# Patient Record
Sex: Female | Born: 1985 | Hispanic: No | Marital: Single | Smoking: Never smoker
Health system: Southern US, Community
[De-identification: ages and names within clinical notes are randomized; demographics above are authoritative.]

## PROBLEM LIST (undated history)

## (undated) DIAGNOSIS — F79 Unspecified intellectual disabilities: Secondary | ICD-10-CM

## (undated) DIAGNOSIS — R6250 Unspecified lack of expected normal physiological development in childhood: Secondary | ICD-10-CM

## (undated) DIAGNOSIS — H5440 Blindness, one eye, unspecified eye: Secondary | ICD-10-CM

## (undated) DIAGNOSIS — E119 Type 2 diabetes mellitus without complications: Secondary | ICD-10-CM

## (undated) DIAGNOSIS — H9190 Unspecified hearing loss, unspecified ear: Secondary | ICD-10-CM

## (undated) HISTORY — PX: OTHER SURGICAL HISTORY: SHX169

## (undated) HISTORY — DX: Unspecified intellectual disabilities: F79

## (undated) HISTORY — DX: Blindness, one eye, unspecified eye: H54.40

## (undated) HISTORY — DX: Type 2 diabetes mellitus without complications: E11.9

---

## 2013-01-11 ENCOUNTER — Ambulatory Visit: Payer: Medicaid Other | Attending: Internal Medicine | Admitting: Internal Medicine

## 2013-01-11 ENCOUNTER — Encounter: Payer: Self-pay | Admitting: Internal Medicine

## 2013-01-11 VITALS — BP 158/107 | Resp 16 | Wt 138.6 lb

## 2013-01-11 DIAGNOSIS — R4789 Other speech disturbances: Secondary | ICD-10-CM

## 2013-01-11 DIAGNOSIS — H5440 Blindness, one eye, unspecified eye: Secondary | ICD-10-CM

## 2013-01-11 DIAGNOSIS — F79 Unspecified intellectual disabilities: Secondary | ICD-10-CM | POA: Insufficient documentation

## 2013-01-11 DIAGNOSIS — F89 Unspecified disorder of psychological development: Secondary | ICD-10-CM

## 2013-01-11 DIAGNOSIS — Z139 Encounter for screening, unspecified: Secondary | ICD-10-CM

## 2013-01-11 DIAGNOSIS — R479 Unspecified speech disturbances: Secondary | ICD-10-CM

## 2013-01-11 DIAGNOSIS — R6250 Unspecified lack of expected normal physiological development in childhood: Secondary | ICD-10-CM

## 2013-01-11 DIAGNOSIS — Z23 Encounter for immunization: Secondary | ICD-10-CM

## 2013-01-11 LAB — CBC WITH DIFFERENTIAL/PLATELET
Basophils Absolute: 0 10*3/uL (ref 0.0–0.1)
Basophils Relative: 0 % (ref 0–1)
Eosinophils Absolute: 0.1 10*3/uL (ref 0.0–0.7)
Eosinophils Relative: 1 % (ref 0–5)
HCT: 41.9 % (ref 36.0–46.0)
Hemoglobin: 14.8 g/dL (ref 12.0–15.0)
Lymphocytes Relative: 34 % (ref 12–46)
Lymphs Abs: 3 10*3/uL (ref 0.7–4.0)
MCH: 33 pg (ref 26.0–34.0)
MCHC: 35.3 g/dL (ref 30.0–36.0)
MCV: 93.3 fL (ref 78.0–100.0)
Monocytes Absolute: 0.5 10*3/uL (ref 0.1–1.0)
Monocytes Relative: 6 % (ref 3–12)
Neutro Abs: 5.4 10*3/uL (ref 1.7–7.7)
Neutrophils Relative %: 59 % (ref 43–77)
Platelets: 298 10*3/uL (ref 150–400)
RBC: 4.49 MIL/uL (ref 3.87–5.11)
RDW: 14 % (ref 11.5–15.5)
WBC: 9 10*3/uL (ref 4.0–10.5)

## 2013-01-11 LAB — COMPLETE METABOLIC PANEL WITH GFR
ALT: 20 U/L (ref 0–35)
AST: 16 U/L (ref 0–37)
Albumin: 4.8 g/dL (ref 3.5–5.2)
Alkaline Phosphatase: 66 U/L (ref 39–117)
BUN: 7 mg/dL (ref 6–23)
CO2: 24 mEq/L (ref 19–32)
Calcium: 9.7 mg/dL (ref 8.4–10.5)
Chloride: 104 mEq/L (ref 96–112)
Creat: 0.59 mg/dL (ref 0.50–1.10)
GFR, Est African American: >89 mL/min
GFR, Est Non African American: >89 mL/min
Glucose, Bld: 217 mg/dL — ABNORMAL HIGH (ref 70–99)
Potassium: 4 mEq/L (ref 3.5–5.3)
Sodium: 136 mEq/L (ref 135–145)
Total Bilirubin: 0.4 mg/dL (ref 0.3–1.2)
Total Protein: 7.7 g/dL (ref 6.0–8.3)

## 2013-01-11 LAB — TSH: TSH: 4.984 u[IU]/mL — ABNORMAL HIGH (ref 0.350–4.500)

## 2013-01-11 NOTE — Progress Notes (Signed)
MRN: [id number] Name: Makayla Roberts  Sex: female Age: 27 y.o. DOB: [birth year]  Allergies: Review of patient's allergies indicates no known allergies.  Chief Complaint  Patient presents with  . Establish Care    HPI: Patient is 27 y.o. female who is accompanied with her mother, has history of developmental disability mental retardation left eye blindness impaired speech comes for the past establish medical care, no acute complaint, mother is requesting the patient to be seen by ophthalmology and patient has never been evaluated by neuropsychology.  Past Medical History  Diagnosis Date  . Blind left eye   . Mental retardation     History reviewed. No pertinent past surgical history.    Medication List    Notice As of 01/11/2013  5:47 PM   You have not been prescribed any medications.      No orders of the defined types were placed in this encounter.    Immunization History  Administered Date(s) Administered  . Influenza,inj,Quad PF,36+ Mos 01/11/2013    History  Substance Use Topics  . Smoking status: Never Smoker   . Smokeless tobacco: Not on file  . Alcohol Use: No    Review of Systems  As noted in HPI  Filed Vitals:   01/11/13 1435  BP: 158/107  Resp: 16    Physical Exam  Physical Exam  Constitutional: No distress.  Eyes:  left eye opac cornea ( history of blindness)  Cardiovascular: Normal rate and regular rhythm.   Pulmonary/Chest: Breath sounds normal.  Musculoskeletal: She exhibits no edema.  Neurological: She is alert.    CBC No results found for this basename: wbc,  rbc,  hgb,  hct,  plt,  mcv,  neutrabs,  lymphsabs,  monoabs,  eosabs,  basosabs    CMP  No results found for this basename: na,  k,  cl,  co2,  glucose,  bun,  creatinine,  calcium,  prot,  albumin,  ast,  alt,  alkphos,  bilitot,  gfrnonaa,  gfraa    No results found for this basename: chol,  tri,  ldl    No components found with this basename: hga1c    No  results found for this basename: AST    Assessment and Plan  Mental retardation - Plan: Ambulatory referral to Neuropsychology  Blindness of left eye - Plan: Ambulatory referral to Ophthalmology  Impaired speech - Plan: Ambulatory referral to Neuropsychology  Developmental disability - Plan: Ambulatory referral to Neuropsychology  Screening - Plan: CBC with Differential, COMPLETE METABOLIC PANEL WITH GFR, TSH, Vit D  25 hydroxy (rtn osteoporosis monitoring)  Need for prophylactic vaccination and inoculation against influenza  Flu shot given  Return in about 6 weeks (around 02/22/2013).  Doris Cheadle, MD

## 2013-01-11 NOTE — Progress Notes (Signed)
Patient here to establish primary dr Records from [place] indicate patient was born with mental Retardation Is blind in her left eye Needs referral to neurology

## 2013-01-12 LAB — VITAMIN D 25 HYDROXY (VIT D DEFICIENCY, FRACTURES): Vit D, 25-Hydroxy: <10 ng/mL — ABNORMAL LOW (ref 30–89)

## 2013-01-24 ENCOUNTER — Other Ambulatory Visit: Payer: Self-pay | Admitting: Internal Medicine

## 2013-01-24 DIAGNOSIS — R7989 Other specified abnormal findings of blood chemistry: Secondary | ICD-10-CM

## 2013-01-25 ENCOUNTER — Telehealth: Payer: Self-pay

## 2013-01-25 NOTE — Telephone Encounter (Signed)
Used the interpreter line The number we have on file is the wrong number Unable to contact patient

## 2013-01-25 NOTE — Telephone Encounter (Signed)
Message copied by Lestine Mount on Wed Jan 25, 2013  9:12 AM ------      Message from: Doris Cheadle      Created: Tue Jan 24, 2013 11:01 AM       Blood work reviewed, noticed low vitamin D, call patient advise to start ergocalciferol 50,000 units once a week for the duration of  12 weeks.      Patient has elevated sugar, advise for low carbohydrate diet will check hemoglobin A1c on the next visit.      Also has abnormal TSH ordered full TFT panel, advise to do blood work prior to the next visit. ------

## 2013-02-24 ENCOUNTER — Ambulatory Visit: Payer: Medicaid Other | Admitting: Internal Medicine

## 2013-03-02 ENCOUNTER — Ambulatory Visit: Payer: Medicaid Other | Attending: Internal Medicine | Admitting: Internal Medicine

## 2013-03-02 ENCOUNTER — Encounter: Payer: Self-pay | Admitting: Internal Medicine

## 2013-03-02 VITALS — BP 151/87 | HR 100 | Temp 98.0°F | Resp 17

## 2013-03-02 DIAGNOSIS — E559 Vitamin D deficiency, unspecified: Secondary | ICD-10-CM | POA: Insufficient documentation

## 2013-03-02 DIAGNOSIS — E039 Hypothyroidism, unspecified: Secondary | ICD-10-CM | POA: Insufficient documentation

## 2013-03-02 DIAGNOSIS — F79 Unspecified intellectual disabilities: Secondary | ICD-10-CM

## 2013-03-02 DIAGNOSIS — F89 Unspecified disorder of psychological development: Secondary | ICD-10-CM

## 2013-03-02 DIAGNOSIS — R4789 Other speech disturbances: Secondary | ICD-10-CM

## 2013-03-02 DIAGNOSIS — R6250 Unspecified lack of expected normal physiological development in childhood: Secondary | ICD-10-CM

## 2013-03-02 DIAGNOSIS — I10 Essential (primary) hypertension: Secondary | ICD-10-CM | POA: Insufficient documentation

## 2013-03-02 DIAGNOSIS — R479 Unspecified speech disturbances: Secondary | ICD-10-CM

## 2013-03-02 DIAGNOSIS — H5440 Blindness, one eye, unspecified eye: Secondary | ICD-10-CM

## 2013-03-02 LAB — POCT GLYCOSYLATED HEMOGLOBIN (HGB A1C): Hemoglobin A1C: 6.5

## 2013-03-02 MED ORDER — LEVOTHYROXINE SODIUM 50 MCG PO TABS: 50.0000 ug | ORAL_TABLET | Freq: Every day | ORAL | Status: DC

## 2013-03-02 MED ORDER — VITAMIN D (ERGOCALCIFEROL) 1.25 MG (50000 UNIT) PO CAPS: 50000.0000 [IU] | ORAL_CAPSULE | ORAL | Status: DC

## 2013-03-03 ENCOUNTER — Telehealth: Payer: Self-pay

## 2013-03-03 LAB — TSH: TSH: 4.919 u[IU]/mL — ABNORMAL HIGH (ref 0.350–4.500)

## 2013-03-03 LAB — T3, FREE: T3, Free: 3 pg/mL (ref 2.3–4.2)

## 2013-03-03 LAB — T4, FREE: Free T4: 1.03 ng/dL (ref 0.80–1.80)

## 2013-03-03 NOTE — Telephone Encounter (Signed)
Interpreter line used Patient not available Left message on machine to return our call 

## 2013-03-03 NOTE — Telephone Encounter (Signed)
Message copied by Lestine MountJUAREZ, Apolonio Cutting L on Fri Mar 03, 2013  3:42 PM ------      Message from: Jeanann LewandowskyJEGEDE, OLUGBEMIGA E      Created: Fri Mar 03, 2013  3:29 PM       Please inform patient that her hemoglobin A1c shows that she is diabetic, we need to start her on medication. Based on her thyroid function tests, we need to repeat this test in about 6 months. ------

## 2013-03-03 NOTE — Progress Notes (Signed)
Patient ID: Makayla Roberts, female   DOB: 1985-11-20, 28 y.o.   MRN: [id number] Patient Demographics  Makayla Roberts, is a 28 y.o. female  [id number]  [id number]  DOB - 1985-11-20  Chief Complaint  Patient presents with  . Follow-up        Subjective:   Makayla Roberts is a 28 y.o. female here today for a follow up visit. She is known to have mental retardation, impaired speech, blind left eye recently established care with us on laboratory tests were ordered. She is here for followup today with her mom and interpreter as well as caretaker. There is no major complaint except that she is very restless and does not stay still even while eating and she eats just anything including inedible materials. She has not been a psychiatrist or neurologist, last workup showed very high TSH and very low vitamin D level. She is yet to start medication.  Patient has No headache, No chest pain, No abdominal pain - No Nausea, No new weakness tingling or numbness, No Cough - SOB.  ALLERGIES: No Known Allergies  PAST MEDICAL HISTORY: Past Medical History  Diagnosis Date  . Blind left eye   . Mental retardation     MEDICATIONS AT HOME: Prior to Admission medications   Medication Sig Start Date End Date Taking? Authorizing Provider  levothyroxine (SYNTHROID, LEVOTHROID) 50 MCG tablet Take 1 tablet (50 mcg total) by mouth daily. 03/02/13   Jeanann Lewandowskylugbemiga Olumide Dolinger, MD  Vitamin D, Ergocalciferol, (DRISDOL) 50000 UNITS CAPS capsule Take 1 capsule (50,000 Units total) by mouth every 7 (seven) days. 03/02/13   Jeanann Lewandowskylugbemiga Shirleyann Montero, MD     Objective:   Filed Vitals:   03/02/13 1721  BP: 151/87  Pulse: 100  Temp: 98 F (36.7 C)  Resp: 17    Exam General appearance : Awake, alert, not in any distress. Speech Clear. Not toxic looking, obese and short statured HEENT: Atraumatic and Normocephalic, left eye corneal clouding, very poor dentition Neck: supple, no JVD. No cervical lymphadenopathy.   Chest:Good air entry bilaterally, no added sounds  CVS: S1 S2 regular, no murmurs.  Abdomen: Bowel sounds present, Non tender and not distended with no gaurding, rigidity or rebound. Extremities: B/L Lower Ext shows no edema, both legs are warm to touch Neurology: Awake alert, nonverbal, restless  Skin:No Rash Wounds:N/A   Data Review   CBC No results found for this basename: WBC, HGB, HCT, PLT, MCV, MCH, MCHC, RDW, NEUTRABS, LYMPHSABS, MONOABS, EOSABS, BASOSABS, BANDABS, BANDSABD,  in the last 168 hours  Chemistries   No results found for this basename: NA, K, CL, CO2, GLUCOSE, BUN, CREATININE, GFRCGP, CALCIUM, MG, AST, ALT, ALKPHOS, BILITOT,  in the last 168 hours ------------------------------------------------------------------------------------------------------------------  Recent Labs  03/02/13 1810  HGBA1C 6.5   ------------------------------------------------------------------------------------------------------------------ No results found for this basename: CHOL, HDL, LDLCALC, TRIG, CHOLHDL, LDLDIRECT,  in the last 72 hours ------------------------------------------------------------------------------------------------------------------  Recent Labs  03/02/13 1719  TSH 4.919*  T3FREE 3.0   ------------------------------------------------------------------------------------------------------------------ No results found for this basename: VITAMINB12, FOLATE, FERRITIN, TIBC, IRON, RETICCTPCT,  in the last 72 hours  Coagulation profile  No results found for this basename: INR, PROTIME,  in the last 168 hours    Assessment & Plan   Patient is nonverbal, most of the discussions and recommendations were based on mother's expression via the interpreter  1. Mental retardation  - Ambulatory referral to Neurology  2. Blindness of left eye Stable 3. Impaired speech Neurology referral  4. Developmental disability  -  Ambulatory referral to Neurology  5.  Hypovitaminosis D Start - Vitamin D, Ergocalciferol, (DRISDOL) 50000 UNITS CAPS capsule; Take 1 capsule (50,000 Units total) by mouth every 7 (seven) days.  Dispense: 30 capsule; Refill: 0  6. Unspecified hypothyroidism The previous TSH is extremely high, we'll start her on medication and other more workup - TSH - T3, Free - T4, Free - levothyroxine (SYNTHROID, LEVOTHROID) 50 MCG tablet; Take 1 tablet (50 mcg total) by mouth daily.  Dispense: 90 tablet; Refill: 0  7. Essential hypertension, benign Patient did not stay still for blood pressure check, I am not 100% sure this is true hypertension. We will continue to monitor blood pressure as much as possible. I have discussed with patient's mother to check the blood pressure which she is more relaxed and if possible at home environment  We will check her- HgB A1c today as well in view of obesity  Follow up in 4 weeks or when necessary  Interpreter was used to communicate directly with patient for the entire encounter including providing detailed patient instructions.   The patient was given clear instructions to go to ER or return to medical center if symptoms don't improve, worsen or new problems develop. The patient verbalized understanding. The patient was told to call to get lab results if they haven't heard anything in the next week.    Jeanann Lewandowsky, MD, MHA, FACP, FAAP [facility] [place], [place] [phone]   03/03/2013, 10:14 AM

## 2013-03-14 ENCOUNTER — Encounter: Payer: Self-pay | Admitting: Neurology

## 2013-03-14 ENCOUNTER — Ambulatory Visit (INDEPENDENT_AMBULATORY_CARE_PROVIDER_SITE_OTHER): Payer: Medicaid Other | Admitting: Neurology

## 2013-03-14 VITALS — Ht 60.0 in | Wt 136.0 lb

## 2013-03-14 DIAGNOSIS — F89 Unspecified disorder of psychological development: Secondary | ICD-10-CM

## 2013-03-14 DIAGNOSIS — R6250 Unspecified lack of expected normal physiological development in childhood: Secondary | ICD-10-CM

## 2013-03-14 DIAGNOSIS — E559 Vitamin D deficiency, unspecified: Secondary | ICD-10-CM

## 2013-03-14 DIAGNOSIS — R479 Unspecified speech disturbances: Secondary | ICD-10-CM

## 2013-03-14 DIAGNOSIS — E039 Hypothyroidism, unspecified: Secondary | ICD-10-CM

## 2013-03-14 DIAGNOSIS — R4789 Other speech disturbances: Secondary | ICD-10-CM

## 2013-03-14 DIAGNOSIS — F79 Unspecified intellectual disabilities: Secondary | ICD-10-CM

## 2013-03-14 DIAGNOSIS — I10 Essential (primary) hypertension: Secondary | ICD-10-CM

## 2013-03-14 DIAGNOSIS — H5440 Blindness, one eye, unspecified eye: Secondary | ICD-10-CM

## 2013-03-14 MED ORDER — RISPERIDONE 1 MG PO TABS: 1.0000 mg | ORAL_TABLET | Freq: Three times a day (TID) | ORAL | Status: DC

## 2013-03-14 NOTE — Progress Notes (Signed)
[facility]  PATIENT: Makayla Roberts DOB: 1985-06-12  HISTORICAL  Patient is a 28 years old female, immigrated from [place] 3 months ago, accompanied by her mother, interpreter, and a Child psychotherapist at today's clinical visit  Mother had home delivery at [place] 29 years ago, it was a prolonged difficult delivery, she was blue and small when she was born, she was delayed in reaching her developmental milestones, began to walk at 28 years old, when she was 28 years old, physician at [place] told her mother that part of her brain was missing    she never went to school, developmentally delayed, was not able to speak, tends to have behavior issues  Since she moved to [place] 3 months ago, living in apartment with her mother, she no longer have other relatives to help taking care of her,  Mother felt overwhelming, patient has gained a lot of weight also become agitated easily, she eats everything that she can get her hands on,   Sometimes she is out of control, began developing hair out of her head, also be violent towards her mother, she has difficulty sleeping,    There was no history of seizure, mother has 3 other children, that is all healthy   REVIEW OF SYSTEMS: Full 14 system review of systems performed and notable only for overeating, rapid weight gain, agitation,   ALLERGIES: No Known Allergies  HOME MEDICATIONS: Outpatient Prescriptions Prior to Visit  Medication Sig Dispense Refill  . levothyroxine (SYNTHROID, LEVOTHROID) 50 MCG tablet Take 1 tablet (50 mcg total) by mouth daily.  90 tablet  0  . Vitamin D, Ergocalciferol, (DRISDOL) 50000 UNITS CAPS capsule Take 1 capsule (50,000 Units total) by mouth every 7 (seven) days.  30 capsule  0   No facility-administered medications prior to visit.    PAST MEDICAL HISTORY: Past Medical History  Diagnosis Date  . Blind left eye   . Mental retardation     PAST SURGICAL HISTORY: Past Surgical History    Procedure Laterality Date  . None      FAMILY HISTORY: Family History  Problem Relation Age of Onset  . Hypertension Mother   . Arthritis Mother   . Heart disease Mother     SOCIAL HISTORY:  History   Social History  . Marital Status: Single    Spouse Name: N/A    Number of Children: N/A  . Years of Education: N/A   Occupational History  . Not on file.   Social History Main Topics  . Smoking status: Never Smoker   . Smokeless tobacco: Never Used  . Alcohol Use: No  . Drug Use: No  . Sexual Activity: No   Other Topics Concern  . Not on file   Social History Narrative   Patient lives at home with her mom (Zenind).   Education- None   Left handed.   Caffeine- Some hot tea      Patient Special Coordinator Evon Slack - [phone] -cell, [phone] and fax [phone].     PHYSICAL EXAM   Filed Vitals:   03/14/13 1052  Height: 5' (1.524 m)  Weight: 136 lb (61.689 kg)    Not recorded    Body mass index is 26.56 kg/(m^2).   Generalized: In no acute distress  Neck: Supple, no carotid bruits   Cardiac: Regular rate rhythm  Pulmonary: Clear to auscultation bilaterally  Musculoskeletal: No deformity  Neurological examination  Mentation: patient was pacing around  the room, impulsive, not talking, not following commands  Cranial nerve II-XII: right pupil was irregular, brisk reactive to light, left cornea was opaque. extraocular movements were full,  Facial  were symmetric Uvula tongue midline.  head turning and shoulder shrug and were normal and symmetric.Tongue protrusion into cheek strength was normal.  Motor: normal tone, bulk and strength.  Sensory: I could not assess  Coordination: not cooperative   Gait: ambulates without difficulty .    Deep tendon reflexes:  Present and symmetric  Laboratory evaluations  Lab Results  Component Value Date   WBC 9.0 01/11/2013   HGB 14.8 01/11/2013   HCT 41.9 01/11/2013   MCV 93.3  01/11/2013   PLT 298 01/11/2013      Component Value Date/Time   NA 136 01/11/2013 1536   K 4.0 01/11/2013 1536   CL 104 01/11/2013 1536   CO2 24 01/11/2013 1536   GLUCOSE 217* 01/11/2013 1536   BUN 7 01/11/2013 1536   CREATININE 0.59 01/11/2013 1536   CALCIUM 9.7 01/11/2013 1536   PROT 7.7 01/11/2013 1536   ALBUMIN 4.8 01/11/2013 1536   AST 16 01/11/2013 1536   ALT 20 01/11/2013 1536   ALKPHOS 66 01/11/2013 1536   BILITOT 0.4 01/11/2013 1536   No results found for this basename: CHOL, HDL, LDLCALC, LDLDIRECT, TRIG, CHOLHDL   Lab Results  Component Value Date   HGBA1C 6.5 03/02/2013   No results found for this basename: VITAMINB12   Lab Results  Component Value Date   TSH 4.919* 03/02/2013      ASSESSMENT AND PLAN   28 years old [place] female, with 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       Levert FeinsteinYijun Krrish Freund, M.D. Ph.D.  [facility] [address], [address] [place], [place] [address] [phone]

## 2013-03-14 NOTE — Patient Instructions (Signed)
Take risperidone 1mg  one tab every night for 2 days, then one tab twice a day for 2 days, then one tab three times a day

## 2013-03-17 ENCOUNTER — Telehealth: Payer: Self-pay | Admitting: *Deleted

## 2013-03-17 ENCOUNTER — Telehealth: Payer: Self-pay | Admitting: Internal Medicine

## 2013-03-17 ENCOUNTER — Other Ambulatory Visit: Payer: Self-pay | Admitting: Internal Medicine

## 2013-03-17 NOTE — Telephone Encounter (Signed)
Nurse Marisue HumbleMaureen calling to report that after taking the 2 prescribed meds, Thyroid med and Vit D, pt went into episode in which she started hitting herself, banging head against wall, screaming, pacing around the room and had drastic appetite change. This lasted from 7am on first day until 4 am the next morning.  Pt is currently not taking meds for fear of episode occuring again.  Nurse/and pt's mom worried because pt is not taking meds and seek advice as to how to proceed.  Please f/u with nurse.

## 2013-03-22 ENCOUNTER — Other Ambulatory Visit: Payer: Medicaid Other

## 2013-03-28 ENCOUNTER — Ambulatory Visit
Admission: RE | Admit: 2013-03-28 | Discharge: 2013-03-28 | Disposition: A | Discharge status: Home / Self Care | Payer: Medicaid Other | Source: Ambulatory Visit | Attending: Neurology | Admitting: Neurology

## 2013-03-28 ENCOUNTER — Telehealth: Payer: Self-pay | Admitting: Neurology

## 2013-03-28 DIAGNOSIS — E039 Hypothyroidism, unspecified: Secondary | ICD-10-CM

## 2013-03-28 DIAGNOSIS — E559 Vitamin D deficiency, unspecified: Secondary | ICD-10-CM

## 2013-03-28 DIAGNOSIS — H5440 Blindness, one eye, unspecified eye: Secondary | ICD-10-CM

## 2013-03-28 DIAGNOSIS — R479 Unspecified speech disturbances: Secondary | ICD-10-CM

## 2013-03-28 DIAGNOSIS — I10 Essential (primary) hypertension: Secondary | ICD-10-CM

## 2013-03-28 DIAGNOSIS — F79 Unspecified intellectual disabilities: Secondary | ICD-10-CM

## 2013-03-28 DIAGNOSIS — F89 Unspecified disorder of psychological development: Secondary | ICD-10-CM

## 2013-03-28 NOTE — Telephone Encounter (Signed)
[facility] and she wanted the doctor to know that she was not able to get a CT head of the patient.  The mother said they gave her the 3 pills prior to exam but patient would still not hold still, they even tried tying patient down.  She said patient would probably need sedation prior to any  exam.

## 2013-03-29 ENCOUNTER — Encounter: Payer: Self-pay | Admitting: Nurse Practitioner

## 2013-03-29 ENCOUNTER — Encounter: Payer: Self-pay | Admitting: Internal Medicine

## 2013-03-30 NOTE — Telephone Encounter (Signed)
I have called her social worker, Makayla Roberts   Patient has paradoxical response with ativan, which was given prior to her CT scan, patient was very agitated during the process, and also afterwards for a few hours   Her mother Wants to consider repeat CAT scan, medication for sedation,  I do not think the CAT scan information will change our management plan with [name], Risperdal has been very helpful.

## 2013-04-17 NOTE — Telephone Encounter (Signed)
Please see doctors response on 03-30-13.

## 2013-05-10 ENCOUNTER — Telehealth: Payer: Self-pay | Admitting: Internal Medicine

## 2013-05-10 NOTE — Telephone Encounter (Signed)
Paperwork for [employer] was dropped off by case Production designer, theatre/television/filmmanager.Marland Kitchen.Marland Kitchen.Marland Kitchen.Paperwork was placed on Dr. Louis MeckelJegede's mailbox.

## 2013-05-24 ENCOUNTER — Telehealth: Payer: Self-pay | Admitting: Internal Medicine

## 2013-05-31 ENCOUNTER — Ambulatory Visit: Payer: Medicaid Other | Admitting: Internal Medicine

## 2013-06-05 ENCOUNTER — Encounter: Payer: Self-pay | Admitting: Nurse Practitioner

## 2013-06-05 ENCOUNTER — Encounter: Payer: Self-pay | Admitting: Internal Medicine

## 2013-06-23 ENCOUNTER — Other Ambulatory Visit: Payer: Self-pay | Admitting: Nurse Practitioner

## 2013-06-23 ENCOUNTER — Ambulatory Visit (INDEPENDENT_AMBULATORY_CARE_PROVIDER_SITE_OTHER): Payer: Medicaid Other | Admitting: Nurse Practitioner

## 2013-06-23 ENCOUNTER — Encounter: Payer: Self-pay | Admitting: Nurse Practitioner

## 2013-06-23 VITALS — BP 130/92 | HR 166 | Wt 136.0 lb

## 2013-06-23 DIAGNOSIS — F79 Unspecified intellectual disabilities: Secondary | ICD-10-CM

## 2013-06-23 DIAGNOSIS — F89 Unspecified disorder of psychological development: Secondary | ICD-10-CM

## 2013-06-23 DIAGNOSIS — R4789 Other speech disturbances: Secondary | ICD-10-CM

## 2013-06-23 DIAGNOSIS — R6250 Unspecified lack of expected normal physiological development in childhood: Secondary | ICD-10-CM

## 2013-06-23 DIAGNOSIS — R479 Unspecified speech disturbances: Secondary | ICD-10-CM

## 2013-06-23 NOTE — Patient Instructions (Signed)
Pt to continue Risperdal at current dose F/U in 6 months

## 2013-06-23 NOTE — Progress Notes (Signed)
[facility]  PATIENT: Makayla Roberts DOB: 03-Nov-1985   REASON FOR VISIT: Followup for mental retardation and developmental delay   HISTORY OF PRESENT ILLNESS:Makayla Roberts, 28 year old female returns today with her mother and an interpreter. Patient had been easily agitated and seen by Dr. Terrace ArabiaYan 03/14/2013. This is much better on Risperdal. Patient was unable to have CT of the head due to the fact that she was uncooperative even with medication. Appetite has decreased as well since she has been on thyroid medication. She returns today for reevaluation.    HISTORY: immigrated from [place] 3 months ago, accompanied by her mother, interpreter, and a Child psychotherapistsocial worker at today's clinical visit  Mother had home delivery at [place] 29 years ago, it was a prolonged difficult delivery, she was blue and small when she was born, she was delayed in reaching her developmental milestones, began to walk at 28 years old, when she was 28 years old, physician at [place] told her mother that part of her brain was missing  she never went to school, developmentally delayed, was not able to speak, tends to have behavior issues  Since she moved to [place] ago, living in apartment with her mother, she no longer have other relatives to help taking care of her,  Mother felt overwhelming, patient has gained a lot of weight also become agitated easily, she eats everything that she can get her hands on,  Sometimes she is out of control, began developing hair out of her head, also be violent towards her mother, she has difficulty sleeping,  There was no history of seizure, mother has 3 other children, that is all healthy    REVIEW OF SYSTEMS: Full 14 system review of systems performed and notable only for those listed, all others are neg:  Constitutional: N/A  Cardiovascular: N/A  Ear/Nose/Throat: N/A  Skin: N/A  Eyes: N/A  Respiratory: N/A  Gastroitestinal: N/A  Hematology/Lymphatic: N/A    Endocrine: N/A Musculoskeletal:N/A  Allergy/Immunology: N/A  Neurological: Memory loss, no speech  Psychiatric: N/A Sleep : NA   ALLERGIES: No Known Allergies  HOME MEDICATIONS: Outpatient Prescriptions Prior to Visit  Medication Sig Dispense Refill  . levothyroxine (SYNTHROID, LEVOTHROID) 50 MCG tablet Take 1 tablet (50 mcg total) by mouth daily.  90 tablet  0  . risperiDONE (RISPERDAL) 1 MG tablet Take 1 tablet (1 mg total) by mouth 3 (three) times daily.  90 tablet  12  . Vitamin D, Ergocalciferol, (DRISDOL) 50000 UNITS CAPS capsule Take 1 capsule (50,000 Units total) by mouth every 7 (seven) days.  30 capsule  0   No facility-administered medications prior to visit.    PAST MEDICAL HISTORY: Past Medical History  Diagnosis Date  . Blind left eye   . Mental retardation     PAST SURGICAL HISTORY: Past Surgical History  Procedure Laterality Date  . None      FAMILY HISTORY: Family History  Problem Relation Age of Onset  . Hypertension Mother   . Arthritis Mother   . Heart disease Mother     SOCIAL HISTORY: History   Social History  . Marital Status: Single    Spouse Name: N/A    Number of Children: N/A  . Years of Education: N/A   Occupational History  . Not on file.   Social History Main Topics  . Smoking status: Never Smoker   . Smokeless tobacco: Never Used  . Alcohol Use: No  . Drug Use: No  . Sexual  Activity: No   Other Topics Concern  . Not on file   Social History Narrative   Patient lives at home with her mom (Makayla Roberts).   Education- None   Left handed.   Caffeine- Some hot tea      Patient Special Coordinator Evon Slack- Margaret Evans - [phone], [phone] and fax [phone].     PHYSICAL EXAM  Filed Vitals:   06/23/13 1043  BP: 130/92  Pulse: 166  Weight: 136 lb (61.689 kg)   Cannot calculate BMI with a height equal to zero.  Generalized: Well developed, in no acute distress  Head: normocephalic and atraumatic,.  Oropharynx benign  Neck: Supple, no carotid bruits  Cardiac: Regular rate rhythm, no murmur  Musculoskeletal: No deformity   Neurological examination   Mentation: Alert , pacing around the room does not follow commands no speech   Cranial nerve II-XII: Left cornea is opaque right pupil is  Irregular, but reacts  extraocular movements were full, unable to test visual fields Facial sensation and strength were normal. hearing was intact to finger rubbing bilaterally. Uvula tongue midline. head turning and shoulder shrug were normal and symmetric.Tongue protrusion into cheek strength was normal. Motor: normal bulk and tone, full strength in the BUE, BLE,  No focal weakness Sensory: Unable to assess Coordination: Unable to assess Reflexes: Symmetric upper and lower plantar responses were flexor bilaterally. Gait and Station: Ambulates in the room without difficulty   DIAGNOSTIC DATA (LABS, IMAGING, TESTING) - I reviewed patient records, labs, notes, testing and imaging myself where available.  Lab Results  Component Value Date   WBC 9.0 01/11/2013   HGB 14.8 01/11/2013   HCT 41.9 01/11/2013   MCV 93.3 01/11/2013   PLT 298 01/11/2013      Component Value Date/Time   NA 136 01/11/2013 1536   K 4.0 01/11/2013 1536   CL 104 01/11/2013 1536   CO2 24 01/11/2013 1536   GLUCOSE 217* 01/11/2013 1536   BUN 7 01/11/2013 1536   CREATININE 0.59 01/11/2013 1536   CALCIUM 9.7 01/11/2013 1536   PROT 7.7 01/11/2013 1536   ALBUMIN 4.8 01/11/2013 1536   AST 16 01/11/2013 1536   ALT 20 01/11/2013 1536   ALKPHOS 66 01/11/2013 1536   BILITOT 0.4 01/11/2013 1536   GFRNONAA >89 01/11/2013 1536   GFRAA >89 01/11/2013 1536    Lab Results  Component Value Date   HGBA1C 6.5 03/02/2013    Lab Results  Component Value Date   TSH 4.919* 03/02/2013      ASSESSMENT AND PLAN  28 y.o. year old female  has a past medical history of Blind left eye and Mental retardation. and developmental delays  here to followup. Unable to get CT scan even with sedation.  Pt to continue Risperdal at current dose F/U in 6 months Nilda RiggsNancy Carolyn Saharra Santo, [facility] PLLCGNP, [facility], APRN  [facility] [address], [address] [place], [place] [address] [phone]

## 2013-06-26 ENCOUNTER — Encounter: Payer: Self-pay | Admitting: Internal Medicine

## 2013-06-26 NOTE — Patient Instructions (Signed)
Pt.'s mother dropped off documents that need to be filled out by provider for continuing care... Papers where placed in Dr. Louis MeckelJegede's mail folder on 06/26/13.Marland Kitchen.Marland Kitchen.CR

## 2013-06-27 ENCOUNTER — Telehealth: Payer: Self-pay | Admitting: Emergency Medicine

## 2013-06-27 ENCOUNTER — Ambulatory Visit: Payer: Medicaid Other | Attending: Internal Medicine

## 2013-06-27 DIAGNOSIS — E119 Type 2 diabetes mellitus without complications: Secondary | ICD-10-CM

## 2013-06-27 LAB — GLUCOSE, POCT (MANUAL RESULT ENTRY): POC Glucose: 195 mg/dl — AB (ref 70–99)

## 2013-06-27 LAB — POCT GLYCOSYLATED HEMOGLOBIN (HGB A1C): Hemoglobin A1C: 6.7

## 2013-06-27 MED ORDER — METFORMIN HCL 500 MG PO TABS: 500.0000 mg | ORAL_TABLET | Freq: Two times a day (BID) | ORAL | Status: DC

## 2013-06-27 NOTE — Telephone Encounter (Signed)
Spoke with pt mother regarding paperwork dropped off 06/26/2013. Pt mother also informed to bring daughter in for A1c today. Arabic interpretor used per [employer] Interpret[employer]

## 2013-06-27 NOTE — Progress Notes (Signed)
Mother brings child in repeat blood sugar/A1c for newly diagnosis Diabetes. A1c- 6.7, Cbb- 185 Pt started on Metformin 500 mg BID and to f/u with Toni Amendourtney in 2 weeks for med management

## 2013-06-27 NOTE — Patient Instructions (Signed)
Start taking prescribed Metformin 500 mg BID and return in 2 weeks to se Bear Stearn[employer]ney

## 2013-07-11 ENCOUNTER — Ambulatory Visit: Payer: Medicaid Other | Admitting: Pharmacist

## 2013-07-17 ENCOUNTER — Encounter: Payer: Medicaid Other | Attending: Internal Medicine | Admitting: *Deleted

## 2013-07-17 ENCOUNTER — Encounter: Payer: Self-pay | Admitting: *Deleted

## 2013-07-17 VITALS — Ht <= 58 in | Wt 140.2 lb

## 2013-07-17 DIAGNOSIS — E119 Type 2 diabetes mellitus without complications: Secondary | ICD-10-CM | POA: Diagnosis not present

## 2013-07-17 DIAGNOSIS — Z713 Dietary counseling and surveillance: Secondary | ICD-10-CM | POA: Insufficient documentation

## 2013-07-17 NOTE — Progress Notes (Signed)
  Medical Nutrition Therapy:  Appt start time: 1400 end time:  0375.  Assessment:  Primary concerns today: patient here with her mother and Arabic interpretor. Patient is mentally retarded and unable to communicate in any way with me. Her mother is her caregiver and expresses basic understanding of diabetes via the interpretor. She stated she feeds her daughter 3 meals a day and tries to limit high carbohydrate and high fat foods on a daily basis. When they have company, sometimes her daughter sneaks foods, but that is infrequently. Mother states they live in a small house and patient does not get much exercise. She does not have a meter, but states her MD wants her to have one. She is covered by Medicaid. They are refugees from [place] and have been here a few months.  Preferred Learning Style:   No preference indicated   Learning Readiness: per mother's efforts  Change in progress  MEDICATIONS: see list, diabetes medication is Metformin   DIETARY INTAKE:  24-hr recall:  B ( AM): hard boiled eggs, fat free chips, yogurt, tea  Snk ( AM): fresh fruit, carrots  L ( PM): rice OR soup, occasionally lamb or chicken, she loves vegetables, salad or cucumbers, tea Snk ( PM): occasionally chips D ( PM): yogurt or salad, patient sneaks food if more is prepared for company, occasionally skips dinner Snk ( PM): no Beverages: tea, water  Usual physical activity: no set activity, lives in small house, no activity available  Estimated energy needs: 1400 calories 158 g carbohydrates 105 g protein 39 g fat  Progress Towards Goal(s):  Modified goal(s).   Nutritional Diagnosis:  NB-1.1 Food and nutrition-related knowledge deficit As related to diabetes and obesity.  As evidenced by A1c of 6.7%.    Intervention:  Nutrition counseling and diabetes education initiated. Discussed physiology of diabetes and insulin resistance. Foods provided my Mother appear appropriate most of the time. Patient needs and  increaase in activity leve. Recommended dancing and Arm Chair Exercises..  Provided Accu Chek Nano BG Monitoring Kit Lot # [id number] Expiration Date: 05/17/14 Instructed mother on use of meter and importance of getting Rx from MD for more Accu Chek Nano Strips and Fast Clix Drum lancets  Teaching Method Utilized: Environmental health practitioner, Auditory  Handouts given during visit include: Living Well with Diabetes Arm Chair Exercise handout  Barriers to learning/adherence to lifestyle change: mentally dependant on family members for care  Demonstrated degree of understanding via:  Teach Back by mother  Monitoring/Evaluation:  Dietary intake, exercise, SMBG, and body weight prn.

## 2013-07-31 ENCOUNTER — Ambulatory Visit
Admission: RE | Admit: 2013-07-31 | Discharge: 2013-07-31 | Disposition: A | Discharge status: Home / Self Care | Payer: No Typology Code available for payment source | Source: Ambulatory Visit | Attending: Infectious Disease | Admitting: Infectious Disease

## 2013-07-31 ENCOUNTER — Other Ambulatory Visit: Payer: Self-pay | Admitting: Infectious Disease

## 2013-07-31 DIAGNOSIS — R7611 Nonspecific reaction to tuberculin skin test without active tuberculosis: Secondary | ICD-10-CM

## 2013-08-03 ENCOUNTER — Ambulatory Visit: Payer: Medicaid Other | Admitting: Internal Medicine

## 2013-08-24 ENCOUNTER — Ambulatory Visit: Payer: Medicaid Other | Admitting: Internal Medicine

## 2013-09-13 ENCOUNTER — Telehealth: Payer: Self-pay | Admitting: Internal Medicine

## 2013-09-13 NOTE — Telephone Encounter (Signed)
[facility] Requests Patient's medications because Pt doesn't know. Please, fax the list to TB Department Fax # [phone]

## 2013-09-13 NOTE — Progress Notes (Signed)
[facility] to determine if patient is eligible for preventative TB medication.

## 2013-10-01 ENCOUNTER — Emergency Department (HOSPITAL_COMMUNITY)
Admission: EM | Admit: 2013-10-01 | Discharge: 2013-10-02 | Disposition: A | Discharge status: Home / Self Care | Payer: Medicaid Other | Attending: Emergency Medicine | Admitting: Emergency Medicine

## 2013-10-01 ENCOUNTER — Emergency Department (HOSPITAL_COMMUNITY): Payer: Medicaid Other

## 2013-10-01 ENCOUNTER — Encounter (HOSPITAL_COMMUNITY): Payer: Self-pay | Admitting: Emergency Medicine

## 2013-10-01 DIAGNOSIS — Z3202 Encounter for pregnancy test, result negative: Secondary | ICD-10-CM | POA: Insufficient documentation

## 2013-10-01 DIAGNOSIS — E119 Type 2 diabetes mellitus without complications: Secondary | ICD-10-CM | POA: Insufficient documentation

## 2013-10-01 DIAGNOSIS — H9190 Unspecified hearing loss, unspecified ear: Secondary | ICD-10-CM | POA: Insufficient documentation

## 2013-10-01 DIAGNOSIS — R011 Cardiac murmur, unspecified: Secondary | ICD-10-CM | POA: Insufficient documentation

## 2013-10-01 DIAGNOSIS — R42 Dizziness and giddiness: Secondary | ICD-10-CM | POA: Insufficient documentation

## 2013-10-01 DIAGNOSIS — R51 Headache: Secondary | ICD-10-CM | POA: Insufficient documentation

## 2013-10-01 DIAGNOSIS — R1110 Vomiting, unspecified: Secondary | ICD-10-CM | POA: Insufficient documentation

## 2013-10-01 DIAGNOSIS — H5440 Blindness, one eye, unspecified eye: Secondary | ICD-10-CM | POA: Insufficient documentation

## 2013-10-01 DIAGNOSIS — F79 Unspecified intellectual disabilities: Secondary | ICD-10-CM | POA: Insufficient documentation

## 2013-10-01 DIAGNOSIS — R55 Syncope and collapse: Secondary | ICD-10-CM | POA: Insufficient documentation

## 2013-10-01 HISTORY — DX: Unspecified hearing loss, unspecified ear: H91.90

## 2013-10-01 LAB — BASIC METABOLIC PANEL
Anion gap: 14 (ref 5–15)
BUN: 6 mg/dL (ref 6–23)
CO2: 25 mEq/L (ref 19–32)
Calcium: 10.3 mg/dL (ref 8.4–10.5)
Chloride: 99 mEq/L (ref 96–112)
Creatinine, Ser: 0.51 mg/dL (ref 0.50–1.10)
GFR calc Af Amer: >90 mL/min (ref >90)
GFR calc non Af Amer: >90 mL/min (ref >90)
Glucose, Bld: 140 mg/dL — ABNORMAL HIGH (ref 70–99)
Potassium: 4.3 mEq/L (ref 3.7–5.3)
Sodium: 138 mEq/L (ref 137–147)

## 2013-10-01 LAB — CBC WITH DIFFERENTIAL/PLATELET
Basophils Absolute: 0 10*3/uL (ref 0.0–0.1)
Basophils Relative: 0 % (ref 0–1)
Eosinophils Absolute: 0 10*3/uL (ref 0.0–0.7)
Eosinophils Relative: 0 % (ref 0–5)
HCT: 44 % (ref 36.0–46.0)
Hemoglobin: 15.2 g/dL — ABNORMAL HIGH (ref 12.0–15.0)
Lymphocytes Relative: 23 % (ref 12–46)
Lymphs Abs: 2.3 10*3/uL (ref 0.7–4.0)
MCH: 30 pg (ref 26.0–34.0)
MCHC: 34.5 g/dL (ref 30.0–36.0)
MCV: 87 fL (ref 78.0–100.0)
Monocytes Absolute: 0.3 10*3/uL (ref 0.1–1.0)
Monocytes Relative: 3 % (ref 3–12)
Neutro Abs: 7.3 10*3/uL (ref 1.7–7.7)
Neutrophils Relative %: 74 % (ref 43–77)
Platelets: 252 10*3/uL (ref 150–400)
RBC: 5.06 MIL/uL (ref 3.87–5.11)
RDW: 12.8 % (ref 11.5–15.5)
WBC: 10 10*3/uL (ref 4.0–10.5)

## 2013-10-01 LAB — POC URINE PREG, ED: Preg Test, Ur: NEGATIVE

## 2013-10-01 LAB — CBG MONITORING, ED: Glucose-Capillary: 150 mg/dL — ABNORMAL HIGH (ref 70–99)

## 2013-10-01 LAB — TSH: TSH: 3.27 u[IU]/mL (ref 0.350–4.500)

## 2013-10-01 NOTE — ED Notes (Signed)
Patient given meal bag. 

## 2013-10-01 NOTE — ED Provider Notes (Signed)
CSN: [id number]     Arrival date & time 10/01/13  2041 History   First MD Initiated Contact with Patient 10/01/13 2117     Chief Complaint  Patient presents with  . Dizziness  . Fall      (Consider location/radiation/quality/duration/timing/severity/associated sxs/prior Treatment) Patient is a 28 y.o. female presenting with syncope. The history is provided by a parent and a relative. The history is limited by a language barrier. A language interpreter was used.  Loss of Consciousness Episode history:  Single Most recent episode:  Today Duration:  5 minutes Timing:  Constant Progression:  Resolved Chronicity:  New Context comment:  Eating Witnessed: yes   Relieved by: washing her face. Worsened by:  Nothing tried Ineffective treatments:  None tried Associated symptoms: headaches and vomiting   Associated symptoms: no chest pain, no dizziness, no fever, no nausea, no palpitations and no shortness of breath    28 yo F with a chief complaint of syncope. 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 Patient was EEG in today with her family and had a syncopal event. Patient lives rolled back in her head she had some chattering of her jaw no noted extremity shaking. Patient woke up about 5 minutes later had one episode of emesis and went back to her baseline. Family denies any head injury. Family denies any abdominal pain. Family denies any diarrhea. Family denies any prior event like this. Family denies any seizure history. Telemetry denies loss of bowel or bladder during the event.  Past Medical History  Diagnosis Date  . Blind left eye   . Mental retardation   . Diabetes mellitus without complication   . Deaf    Past Surgical History  Procedure Laterality Date  . None     Family History  Problem Relation Age of Onset  . Hypertension Mother   . Arthritis Mother   . Heart disease Mother    History   Substance Use Topics  . Smoking status: Never Smoker   . Smokeless tobacco: Never Used  . Alcohol Use: No   OB History   Grav Para Term Preterm Abortions TAB SAB Ect Mult Living                 Review of Systems  Unable to perform ROS: Patient nonverbal  Constitutional: Negative for fever and chills.  HENT: Negative for congestion and rhinorrhea.   Eyes: Negative for redness and visual disturbance.  Respiratory: Negative for shortness of breath and wheezing.   Cardiovascular: Positive for syncope. Negative for chest pain and palpitations.  Gastrointestinal: Positive for vomiting. Negative for nausea.  Genitourinary: Negative for dysuria and urgency.  Musculoskeletal: Negative for arthralgias and myalgias.  Skin: Negative for pallor and wound.  Neurological: Positive for syncope and headaches. Negative for dizziness.      Allergies  Review of patient's allergies indicates no known allergies.  Home Medications   Prior to Admission medications   Medication Sig Start Date End Date Taking? Authorizing Provider  isoniazid (NYDRAZID) 300 MG tablet Take 300 mg by mouth daily.   Yes Historical Provider, MD  levothyroxine (SYNTHROID, LEVOTHROID) 50 MCG tablet Take 1 tablet (50 mcg total) by mouth daily. 03/02/13  Yes Jeanann Lewandowsky, MD  metFORMIN (GLUCOPHAGE) 500 MG tablet Take 1 tablet (500 mg total) by mouth 2 (two) times daily with a meal. 06/27/13  Yes Jeanann Lewandowsky, MD  pyridOXINE (VITAMIN B-6) 25 MG tablet Take 25 mg by mouth daily.   Yes Historical Provider, MD  risperiDONE (RISPERDAL) 1 MG tablet Take 1 tablet (1 mg total) by mouth 3 (three) times daily. 03/14/13  Yes Levert FeinsteinYijun Yan, MD   BP 112/72  Pulse 117  Temp(Src) 97.6 F (36.4 C) (Temporal)  Resp 20  SpO2 100%  LMP 09/24/2013 Physical Exam  Constitutional: She appears well-developed and well-nourished. No distress.  HENT:  Head: Normocephalic and atraumatic.  Eyes: EOM are normal. Pupils are equal, round, and  reactive to light.  Neck: Normal range of motion. Neck supple.  Cardiovascular: Normal rate and regular rhythm.  Exam reveals no gallop and no friction rub.   Murmur (6/6 systolic ejection murmur) heard. Pulmonary/Chest: Effort normal. She has no wheezes. She has no rales.  Abdominal: Soft. She exhibits no distension. There is no tenderness.  Musculoskeletal: She exhibits no edema and no tenderness.  Neurological: She is alert.  Skin: Skin is warm and dry. She is not diaphoretic.  Psychiatric: She has a normal mood and affect. Her behavior is normal.    ED Course  Procedures (including critical care time) Labs Review Labs Reviewed  CBC WITH DIFFERENTIAL - Abnormal; Notable for the following:    Hemoglobin 15.2 (*)    All other components within normal limits  BASIC METABOLIC PANEL - Abnormal; Notable for the following:    Glucose, Bld 140 (*)    All other components within normal limits  CBG MONITORING, ED - Abnormal; Notable for the following:    Glucose-Capillary 150 (*)    All other components within normal limits  TSH  POC URINE PREG, ED    Imaging Review Dg Chest Portable 1 View  10/01/2013   CLINICAL DATA:  Dizziness  EXAM: PORTABLE CHEST - 1 VIEW  COMPARISON:  07/31/2013  FINDINGS: Normal heart size. Stable appearance of the upper mediastinal contours, with mildly high aortic arch. When accounting for low lung volumes and interstitial crowding - no edema, consolidation, effusion, or pneumothorax.  IMPRESSION: No active disease.   Electronically Signed   By: Tiburcio PeaJonathan  Watts M.D.   On: 10/01/2013 22:18     EKG Interpretation   Date/Time:  Sunday October 01 2013 22:48:07 EDT Ventricular Rate:  111 PR Interval:  147 QRS Duration: 79 QT Interval:  318 QTC Calculation: 432 R Axis:   61 Text Interpretation:  Sinus tachycardia Ventricular premature complex  Aberrant conduction of SV complex(es) RSR' in V1 or V2, right VCD or RVH  Confirmed by Rubin PayorPICKERING  MD, NATHAN [phone]([id number]  on 10/01/2013 11:02:52 PM      MDM   Final diagnoses:  Syncope and collapse    28 yo F with a chief complaint syncope. Obtain EEG chest x-ray. Will obtain a blood sugar.  CBC, poc preg negative.  Not anemic.  VSS, back to baseline.  Will discharge home, follow up with PCP, cardiology.  12:04 AM:  I have discussed the diagnosis/risks/treatment options with the patient and believe the pt to be eligible for discharge home to follow-up with PCP, cardiology. We also discussed returning to the ED immediately if new or worsening sx occur. We discussed the sx which are most concerning (e.g., repeat event) that necessitate immediate return. Medications administered to the patient during their visit and any new prescriptions provided to the patient are listed below.  Medications given during this visit Medications - No data to display  New Prescriptions   No medications on file  Melene Plan, MD 10/02/13 [phone]

## 2013-10-01 NOTE — ED Notes (Addendum)
Pt's CBG=150 

## 2013-10-01 NOTE — ED Notes (Signed)
Resident at bedside. Interpreter phone in use. Patient and family speak arabic.

## 2013-10-01 NOTE — Discharge Instructions (Signed)
°æÑÕÏ ÇáÍÏË ÇáÞáÈ åæ ÌåÇÒ ÊÓÌíá ÕÛíÑ íÓÊÎÏã ááãÓÇÚÏÉ Ýí ÇáßÔÝ ÇäÊÙÇã ÖÑÈÇÊ ÇáÞáÈ (ÚÏã ÇäÊÙÇã ÖÑÈÇÊ ÇáÞáÈ). íÊã ÇÓÊÎÏÇã ÌåÇÒ áÊÓÌíá ÖÑÈÇÊ ÇáÞáÈ ÚäÏãÇ ÊÍÏË ÃÚÑÇÖ ÙÇåÑÉ ãËá ãÇ íáí: °ÓÑÚÉ ÇáäÈÖ (ÇáÎÝÞÇä)¡ ãËá ÊÓÇÑÚ ÖÑÈÇÊ ÇáÞáÈ Ãæ ÇáÊÕÝíÞ. °ÇáÏæÎÉ. °ÇáÅÛãÇÁ Ãæ ÇáÏæÇÑ ÇáÎÝíÝ. °ÖÚÝ ÛíÑ ÇáãÈÑÑÉ. °ÇáÓáßíÉ ÇáÔÇÔÉ Åáì ÞØÈíä æÖÚåÇ Úáì ÕÏÑß. ÃÞØÇÈ ãÓØÍÉ¡ æÇáÃÞÑÇÕ áÒÌÉ Ãä äÚáÞ Úáì ÈÔÑÊß. íãßä Ãä ÊÑÊÏíå ÇáÔÇÔÉ áãÏÉ ÊÕá Åáì   28 .          . HOW TO   EVENT MONITOR       .             .          .            .          .  :           :     .      .         .    .        .           .       .         Marland Kitchen.               Marland Kitchen.                 Marland Kitchen.                      Marland Kitchen.                            .                          .            Marland Kitchen.                 Marland Kitchen.                  Marland Kitchen.                     Marland Kitchen.              Marland Kitchen.              Marland Kitchen.             [facility]. SEEK  MEDICAL CARE IF:    .         .           .      .         .   : 2007/11/12  : 2013/06/19   : 2012/08/01 [employer]    2015 [employer]               Marland Kitchen.            .Marland Kitchen

## 2013-10-01 NOTE — ED Notes (Signed)
Patient has MR, blind in left eye and is deaf. Interrpitor phone used to communicate with the family at bedside. Patient fell in the kitchen and according to the family she was shaking for apprx. 5 min. She did not stop until they place a cold rag on her face. They state patient is acting her normal behavior at this time. She is up and walking around the room using all extremities without any difficulty.

## 2013-10-01 NOTE — ED Notes (Signed)
Patient presents with family stating that after eating today she "passed out" eyes rolled back in her head and was uncouncious for a few minutes

## 2013-10-02 NOTE — ED Notes (Signed)
Patient discharged with all personal belongings. 

## 2013-10-04 NOTE — ED Provider Notes (Signed)
I saw and evaluated the patient, reviewed the resident's note and I agree with the findings and plan.   EKG Interpretation   Date/Time:  Sunday October 01 2013 22:48:07 EDT Ventricular Rate:  111 PR Interval:  147 QRS Duration: 79 QT Interval:  318 QTC Calculation: 432 R Axis:   61 Text Interpretation:  Sinus tachycardia Ventricular premature complex  Aberrant conduction of SV complex(es) RSR' in V1 or V2, right VCD or RVH  Confirmed by Rubin PayorPICKERING  MD, Madilynn Montante [phone]([id number]) on 10/01/2013 11:02:52 PM     Patient with syncopal episode. I've told back in her head. Woke 5 minutes later. No seizure history. Lab work and work are reassuring. Will discharge followup as needed  Juliet Rudeathan R. Rubin PayorPickering, MD 10/04/13 [phone]

## 2013-10-19 ENCOUNTER — Emergency Department (HOSPITAL_COMMUNITY)
Admission: EM | Admit: 2013-10-19 | Discharge: 2013-10-19 | Disposition: A | Discharge status: Home / Self Care | Payer: Self-pay | Attending: Emergency Medicine | Admitting: Emergency Medicine

## 2013-10-19 ENCOUNTER — Emergency Department (HOSPITAL_COMMUNITY): Payer: Medicaid Other

## 2013-10-19 ENCOUNTER — Encounter (HOSPITAL_COMMUNITY): Payer: Self-pay | Admitting: Emergency Medicine

## 2013-10-19 DIAGNOSIS — R509 Fever, unspecified: Secondary | ICD-10-CM | POA: Insufficient documentation

## 2013-10-19 DIAGNOSIS — L03119 Cellulitis of unspecified part of limb: Secondary | ICD-10-CM

## 2013-10-19 DIAGNOSIS — F79 Unspecified intellectual disabilities: Secondary | ICD-10-CM | POA: Insufficient documentation

## 2013-10-19 DIAGNOSIS — H9190 Unspecified hearing loss, unspecified ear: Secondary | ICD-10-CM | POA: Insufficient documentation

## 2013-10-19 DIAGNOSIS — R000 Tachycardia, unspecified: Secondary | ICD-10-CM | POA: Insufficient documentation

## 2013-10-19 DIAGNOSIS — Z79899 Other long term (current) drug therapy: Secondary | ICD-10-CM | POA: Insufficient documentation

## 2013-10-19 DIAGNOSIS — L03115 Cellulitis of right lower limb: Secondary | ICD-10-CM

## 2013-10-19 DIAGNOSIS — L02419 Cutaneous abscess of limb, unspecified: Secondary | ICD-10-CM | POA: Insufficient documentation

## 2013-10-19 DIAGNOSIS — E119 Type 2 diabetes mellitus without complications: Secondary | ICD-10-CM | POA: Insufficient documentation

## 2013-10-19 DIAGNOSIS — H5440 Blindness, one eye, unspecified eye: Secondary | ICD-10-CM | POA: Insufficient documentation

## 2013-10-19 LAB — COMPREHENSIVE METABOLIC PANEL
ALT: 12 U/L (ref 0–35)
AST: 12 U/L (ref 0–37)
Albumin: 3.4 g/dL — ABNORMAL LOW (ref 3.5–5.2)
Alkaline Phosphatase: 55 U/L (ref 39–117)
Anion gap: 12 (ref 5–15)
BUN: 7 mg/dL (ref 6–23)
CO2: 26 mEq/L (ref 19–32)
Calcium: 8.9 mg/dL (ref 8.4–10.5)
Chloride: 99 mEq/L (ref 96–112)
Creatinine, Ser: 0.68 mg/dL (ref 0.50–1.10)
GFR calc Af Amer: >90 mL/min (ref >90)
GFR calc non Af Amer: >90 mL/min (ref >90)
Glucose, Bld: 141 mg/dL — ABNORMAL HIGH (ref 70–99)
Potassium: 4.2 mEq/L (ref 3.7–5.3)
Sodium: 137 mEq/L (ref 137–147)
Total Bilirubin: 0.3 mg/dL (ref 0.3–1.2)
Total Protein: 6.6 g/dL (ref 6.0–8.3)

## 2013-10-19 LAB — CBC WITH DIFFERENTIAL/PLATELET
Basophils Absolute: 0 10*3/uL (ref 0.0–0.1)
Basophils Relative: 0 % (ref 0–1)
Eosinophils Absolute: 0 10*3/uL (ref 0.0–0.7)
Eosinophils Relative: 0 % (ref 0–5)
HCT: 37.3 % (ref 36.0–46.0)
Hemoglobin: 13 g/dL (ref 12.0–15.0)
Lymphocytes Relative: 18 % (ref 12–46)
Lymphs Abs: 1.4 10*3/uL (ref 0.7–4.0)
MCH: 30.4 pg (ref 26.0–34.0)
MCHC: 34.9 g/dL (ref 30.0–36.0)
MCV: 87.1 fL (ref 78.0–100.0)
Monocytes Absolute: 0.4 10*3/uL (ref 0.1–1.0)
Monocytes Relative: 5 % (ref 3–12)
Neutro Abs: 5.9 10*3/uL (ref 1.7–7.7)
Neutrophils Relative %: 77 % (ref 43–77)
Platelets: 191 10*3/uL (ref 150–400)
RBC: 4.28 MIL/uL (ref 3.87–5.11)
RDW: 12.8 % (ref 11.5–15.5)
WBC: 7.7 10*3/uL (ref 4.0–10.5)

## 2013-10-19 LAB — URINALYSIS, ROUTINE W REFLEX MICROSCOPIC
Bilirubin Urine: NEGATIVE
Glucose, UA: NEGATIVE mg/dL
Hgb urine dipstick: NEGATIVE
Ketones, ur: NEGATIVE mg/dL
Leukocytes, UA: NEGATIVE
Nitrite: NEGATIVE
Protein, ur: NEGATIVE mg/dL
Specific Gravity, Urine: 1.014 (ref 1.005–1.030)
Urobilinogen, UA: 0.2 mg/dL (ref 0.0–1.0)
pH: 8.5 — ABNORMAL HIGH (ref 5.0–8.0)

## 2013-10-19 LAB — I-STAT CG4 LACTIC ACID, ED: Lactic Acid, Venous: 1.58 mmol/L (ref 0.5–2.2)

## 2013-10-19 MED ORDER — DOXYCYCLINE HYCLATE 100 MG PO CAPS: 100.0000 mg | ORAL_CAPSULE | Freq: Two times a day (BID) | ORAL | Status: DC

## 2013-10-19 MED ORDER — ACETAMINOPHEN 325 MG PO TABS
650.0000 mg | ORAL_TABLET | Freq: Once | ORAL | Status: AC
  Administered 2013-10-19: 650 mg via ORAL
  Filled 2013-10-19: qty 2

## 2013-10-19 MED ORDER — SODIUM CHLORIDE 0.9 % IV BOLUS (SEPSIS)
1000.0000 mL | Freq: Once | INTRAVENOUS | Status: AC
  Administered 2013-10-19: 1000 mL via INTRAVENOUS

## 2013-10-19 MED ORDER — IBUPROFEN 200 MG PO TABS
600.0000 mg | ORAL_TABLET | Freq: Once | ORAL | Status: AC
  Administered 2013-10-19: 600 mg via ORAL
  Filled 2013-10-19 (×2): qty 1

## 2013-10-19 NOTE — ED Notes (Signed)
Patient will not sit back in the bed to go to radiology.  Chest xray changed to portable.

## 2013-10-19 NOTE — ED Provider Notes (Signed)
CSN: [id number]     Arrival date & time 10/19/13  1931 History   First MD Initiated Contact with Patient 10/19/13 1951     Chief Complaint  Patient presents with  . Fever     (Consider location/radiation/quality/duration/timing/severity/associated sxs/prior Treatment) HPI Comments: 28 year old female with mental retardation, nonverbal, blindness, disability, high blood pressure presents to the ER with her mother for fever since this morning. No sick contacts or recent infections. Mild cough and right redness where she picks at. No other source of fever. No neck stiffness. Translator used discussed with mother. Patient had normal mental state, no worsening confusion or lethargy. Patient normally intermittent agitation.  Patient is a 28 y.o. female presenting with fever. The history is provided by a parent and medical records.  Fever   Past Medical History  Diagnosis Date  . Blind left eye   . Mental retardation   . Diabetes mellitus without complication   . Deaf    Past Surgical History  Procedure Laterality Date  . None     Family History  Problem Relation Age of Onset  . Hypertension Mother   . Arthritis Mother   . Heart disease Mother    History  Substance Use Topics  . Smoking status: Never Smoker   . Smokeless tobacco: Never Used  . Alcohol Use: No   OB History   Grav Para Term Preterm Abortions TAB SAB Ect Mult Living                 Review of Systems  Unable to perform ROS Constitutional: Positive for fever.      Allergies  Review of patient's allergies indicates no known allergies.  Home Medications   Prior to Admission medications   Medication Sig Start Date End Date Taking? Authorizing Provider  doxycycline (VIBRAMYCIN) 100 MG capsule Take 1 capsule (100 mg total) by mouth 2 (two) times daily. One po bid x 7 days 10/19/13   Enid Skeens, MD  isoniazid (NYDRAZID) 300 MG tablet Take 300 mg by mouth daily.    Historical Provider, MD  levothyroxine  (SYNTHROID, LEVOTHROID) 50 MCG tablet Take 1 tablet (50 mcg total) by mouth daily. 03/02/13   Quentin Angst, MD  metFORMIN (GLUCOPHAGE) 500 MG tablet Take 1 tablet (500 mg total) by mouth 2 (two) times daily with a meal. 06/27/13   Quentin Angst, MD  pyridOXINE (VITAMIN B-6) 25 MG tablet Take 25 mg by mouth daily.    Historical Provider, MD  risperiDONE (RISPERDAL) 1 MG tablet Take 1 tablet (1 mg total) by mouth 3 (three) times daily. 03/14/13   Levert Feinstein, MD   BP 101/68  Pulse 120  Temp(Src) 98.6 F (37 C) (Oral)  Resp 22  SpO2 98%  LMP 09/24/2013 Physical Exam  Nursing note and vitals reviewed. Constitutional: She appears well-developed and well-nourished.  HENT:  Head: Normocephalic and atraumatic.  Mild dry mucous membrane  Eyes: Right eye exhibits no discharge. Left eye exhibits no discharge.  Neck: Normal range of motion. Neck supple. No tracheal deviation present.  Cardiovascular: Regular rhythm.  Tachycardia present.   Pulmonary/Chest: Effort normal and breath sounds normal.  Abdominal: Soft. She exhibits no distension. There is no tenderness. There is no guarding.  Musculoskeletal: She exhibits no edema.  Neurological: She is alert. GCS eye subscore is 4. GCS verbal subscore is 3. GCS motor subscore is 5.  Patient with intermittent agitation in ER, alert, 5+ bilateral grossly upper and lower extremities. Neck supple no  meningismus, difficult neuro exam to 2 MR  Skin: Skin is warm. Rash noted. There is erythema.  Patient has 3 cm area of warmth and erythema medial right lower extremity, no streaking erythema no induration, no crepitus. Mild excoriations centrally  Psychiatric:  MR    ED Course  Procedures (including critical care time) Labs Review Labs Reviewed  COMPREHENSIVE METABOLIC PANEL - Abnormal; Notable for the following:    Glucose, Bld 141 (*)    Albumin 3.4 (*)    All other components within normal limits  URINALYSIS, ROUTINE W REFLEX MICROSCOPIC -  Abnormal; Notable for the following:    pH 8.5 (*)    All other components within normal limits  CBC WITH DIFFERENTIAL  I-STAT CG4 LACTIC ACID, ED    Imaging Review Dg Chest Portable 1 View  10/19/2013   CLINICAL DATA:  Fever  EXAM: PORTABLE CHEST - 1 VIEW  COMPARISON:  10/01/2013  FINDINGS: Degraded by rotation. Central peribronchial thickening. Cardiomediastinal contours within normal range. Mild right hemidiaphragm elevation. No definite pleural effusion or pneumothorax. No acute osseous finding.  IMPRESSION: Mild central peribronchial thickening may reflect bronchitis. No focal consolidation.   Electronically Signed   By: Jearld Lesch M.D.   On: 10/19/2013 22:24     EKG Interpretation None      MDM   Final diagnoses:  Cellulitis of leg, right  Fever, unspecified   Patient with fever and tachycardia. Fever improved in ER and intermittent tachycardia clinically secondary to agitation. Translator phone utilized and long discussion with mother, patient had baseline mentally and his behavior is normal for her. Vitals improved in ER. Chest x-ray and urinalysis reviewed no acute findings, lactate normal, white blood cell count normal. Only source of infection on exam his cellulitis. Plan for antibiotics and close followup outpatient reasons return discussed with the mother.  Results and differential diagnosis were discussed with the patient/parent/guardian. Close follow up outpatient was discussed, comfortable with the plan.   Medications  sodium chloride 0.9 % bolus 1,000 mL (0 mLs Intravenous Stopped 10/19/13 2300)  acetaminophen (TYLENOL) tablet 650 mg (650 mg Oral Given 10/19/13 2039)  ibuprofen (ADVIL,MOTRIN) tablet 600 mg (600 mg Oral Given 10/19/13 2317)    Filed Vitals:   10/19/13 2130 10/19/13 2138 10/19/13 2320 10/19/13 2328  BP:      Pulse:   120 102  Temp: 102.6 F (39.2 C) 103 F (39.4 C) 98.6 F (37 C)   TempSrc: Oral Oral Oral   Resp:    22  SpO2:   98%          Enid Skeens, MD 10/20/13 0128

## 2013-10-19 NOTE — ED Notes (Signed)
Family assisted patient up to the [facility] to obtain urine.  Patient tolerated well

## 2013-10-19 NOTE — Discharge Instructions (Signed)
If you were given medicines take as directed.  If you are on coumadin or contraceptives realize their levels and effectiveness is altered by many different medicines.  If you have any reaction (rash, tongues swelling, other) to the medicines stop taking and see a physician.   Please follow up as directed and return to the ER or see a physician for new or worsening symptoms.  Thank you. Filed Vitals:   10/19/13 2045 10/19/13 2130 10/19/13 2138 10/19/13 2320  BP: 101/68     Pulse:    120  Temp:  102.6 F (39.2 C) 103 F (39.4 C) 98.6 F (37 C)  TempSrc:  Oral Oral Oral  Resp:      SpO2:    98%

## 2013-10-19 NOTE — ED Notes (Signed)
Pt in with family c/o fever since this morning, body aches and headache, last had ibuprofen at 9am, pt behavior is normal for her, pt is nonverbal per norm

## 2013-10-19 NOTE — ED Notes (Signed)
Patients normal is to be non verbal and attempting to get up from the bed.  Has been pulling off all wires.  Family reports she started with a fever this AM.  Family stated that they gave her Advil this morning at 9am and have not given her anything since then.  Explained that they can give her something for fever every 6-8 hours.  Family denies any vomiting or diarrhea.  States she has been urinating as usual and is eating without difficulty.

## 2013-12-26 ENCOUNTER — Ambulatory Visit (INDEPENDENT_AMBULATORY_CARE_PROVIDER_SITE_OTHER): Payer: Medicaid Other | Admitting: Adult Health

## 2013-12-26 ENCOUNTER — Encounter: Payer: Self-pay | Admitting: Adult Health

## 2013-12-26 VITALS — BP 122/82 | HR 121 | Ht 59.0 in | Wt 135.0 lb

## 2013-12-26 DIAGNOSIS — F89 Unspecified disorder of psychological development: Secondary | ICD-10-CM

## 2013-12-26 DIAGNOSIS — F69 Unspecified disorder of adult personality and behavior: Secondary | ICD-10-CM

## 2013-12-26 DIAGNOSIS — IMO0002 Reserved for concepts with insufficient information to code with codable children: Secondary | ICD-10-CM

## 2013-12-26 DIAGNOSIS — F79 Unspecified intellectual disabilities: Secondary | ICD-10-CM

## 2013-12-26 NOTE — Progress Notes (Signed)
PATIENT: Makayla Roberts DOB: March 30, 1985  REASON FOR VISIT: follow up HISTORY FROM: family  HISTORY OF PRESENT ILLNESS: Makayla Roberts is a 28 year old female with a history of mental retardation, impaired speech and behavioral issues. The patient returns today for followup an interpreter is present. She is currently taken Risperdal and mom reports that it is working well for her. She states that the patient is eating a lot. Family state that she is doing better since starting school. She goes to school 3 days a week and stays there for 8 hours. Family states that she only gets aggressive when they do not allow her to eat.   HISTORY 06/23/13 (CM): Makayla Roberts, 28 year old female returns today with her mother and an interpreter. Patient had been easily agitated and seen by Dr. Terrace Arabia 03/14/2013. This is much better on Risperdal. Patient was unable to have CT of the head due to the fact that she was uncooperative even with medication. Appetite has decreased as well since she has been on thyroid medication. She returns today for reevaluation. HISTORY 03/14/13 (YAN):: immigrated from [place] 3 months ago, accompanied by her mother, interpreter, and a Child psychotherapist at today's clinical visit  Mother had home delivery at [place] 29 years ago, it was a prolonged difficult delivery, she was blue and small when she was born, she was delayed in reaching her developmental milestones, began to walk at 28 years old, when she was 28 years old, physician at [place] told her mother that part of her brain was missing  she never went to school, developmentally delayed, was not able to speak, tends to have behavior issues  Since she moved to [place] 3 months ago, living in apartment with her mother, she no longer have other relatives to help taking care of her,  Mother felt overwhelming, patient has gained a lot of weight also become agitated easily, she eats everything that she can get her hands on,  Sometimes she is  out of control, began developing hair out of her head, also be violent towards her mother, she has difficulty sleeping,  There was no history of seizure, mother has 3 other children, that is all healthy    REVIEW OF SYSTEMS: Full 14 system review of systems performed and notable only for:  Constitutional: N/A  Eyes: N/A Ear/Nose/Throat: N/A  Skin: N/A  Cardiovascular: N/A  Respiratory: N/A  Gastrointestinal: N/A  Genitourinary: N/A Hematology/Lymphatic: N/A  Endocrine: N/A Musculoskeletal:N/A  Allergy/Immunology: N/A  Neurological: N/A Psychiatric: N/A Sleep: N/A   ALLERGIES: No Known Allergies  HOME MEDICATIONS: Outpatient Prescriptions Prior to Visit  Medication Sig Dispense Refill  . isoniazid (NYDRAZID) 300 MG tablet Take 300 mg by mouth daily.    Marland Kitchen levothyroxine (SYNTHROID, LEVOTHROID) 50 MCG tablet Take 1 tablet (50 mcg total) by mouth daily. 90 tablet 0  . metFORMIN (GLUCOPHAGE) 500 MG tablet Take 1 tablet (500 mg total) by mouth 2 (two) times daily with a meal. 180 tablet 3  . pyridOXINE (VITAMIN B-6) 25 MG tablet Take 25 mg by mouth daily.    . risperiDONE (RISPERDAL) 1 MG tablet Take 1 tablet (1 mg total) by mouth 3 (three) times daily. 90 tablet 12  . doxycycline (VIBRAMYCIN) 100 MG capsule Take 1 capsule (100 mg total) by mouth 2 (two) times daily. One po bid x 7 days 14 capsule 0   No facility-administered medications prior to visit.    PAST MEDICAL HISTORY: Past Medical History  Diagnosis Date  . Blind  left eye   . Mental retardation   . Diabetes mellitus without complication   . Deaf     PAST SURGICAL HISTORY: Past Surgical History  Procedure Laterality Date  . None      FAMILY HISTORY: Family History  Problem Relation Age of Onset  . Hypertension Mother   . Arthritis Mother   . Heart disease Mother     SOCIAL HISTORY: History   Social History  . Marital Status: Single    Spouse Name: N/A    Number of Children: N/A  . Years of  Education: N/A   Occupational History  . Not on file.   Social History Main Topics  . Smoking status: Never Smoker   . Smokeless tobacco: Never Used  . Alcohol Use: No  . Drug Use: No  . Sexual Activity: No   Other Topics Concern  . Not on file   Social History Narrative   Patient lives at home with her mom (Zenind).   Education- None   Left handed.   Caffeine- Some hot tea      Patient Special Coordinator Evon Slack- Margaret Evans - [phone], [phone] and fax [phone].      PHYSICAL EXAM  Filed Vitals:   12/26/13 1047  BP: 122/82  Pulse: 121  Height: 4\' 11"  (1.499 m)  Weight: 135 lb (61.236 kg)   Body mass index is 27.25 kg/(m^2).  Generalized: Well developed, in no acute distress   Neurological examination  Mentation: Nonverbal, unable to follow directions.  Cranial nerve II-XII: Left cornea opaque, right pupil irregular but reactive to light. . Extraocular movements were full. Facial symmetry. Motor: The motor testing reveals 5 over 5 strength of all 4 extremities. Good symmetric motor tone is noted throughout.  Sensory: could not assess Coordination: could not assess Gait and station: ambulates independently and without difficulty.  Reflexes: Deep tendon reflexes are symmetric and normal bilaterally.   DIAGNOSTIC DATA (LABS, IMAGING, TESTING) - I reviewed patient records, labs, notes, testing and imaging myself where available.  Lab Results  Component Value Date   WBC 7.7 10/19/2013   HGB 13.0 10/19/2013   HCT 37.3 10/19/2013   MCV 87.1 10/19/2013   PLT 191 10/19/2013      Component Value Date/Time   NA 137 10/19/2013 1942   K 4.2 10/19/2013 1942   CL 99 10/19/2013 1942   CO2 26 10/19/2013 1942   GLUCOSE 141* 10/19/2013 1942   BUN 7 10/19/2013 1942   CREATININE 0.68 10/19/2013 1942   CREATININE 0.59 01/11/2013 1536   CALCIUM 8.9 10/19/2013 1942   PROT 6.6 10/19/2013 1942   ALBUMIN 3.4* 10/19/2013 1942   AST 12 10/19/2013 1942   ALT  12 10/19/2013 1942   ALKPHOS 55 10/19/2013 1942   BILITOT 0.3 10/19/2013 1942   GFRNONAA >90 10/19/2013 1942   GFRNONAA >89 01/11/2013 1536   GFRAA >90 10/19/2013 1942   GFRAA >89 01/11/2013 1536    Lab Results  Component Value Date   HGBA1C 6.7 06/27/2013    Lab Results  Component Value Date   TSH 3.270 10/01/2013      ASSESSMENT AND PLAN 28 y.o. year old female  has a past medical history of Blind left eye; Mental retardation; Diabetes mellitus without complication; and Deaf. here with:  1. Mental retardation 2. Developmental delays 3. Behavior issues  Patient has been doing well on Risperdal. Family states she sleeps well at night now. She also is doing better since she  started school. The family is concerned that she is eating too much. According to our documentation her weight has remained stable. If the patient's symptoms worsen or she develops new symptoms he should let us know. Otherwise she will followup in 6 months or sooner if needed.  Butch PennyMegan Cliffard Hair, MSN, NP-C 12/26/2013, 10:54 AM [facility] [address], [address] [place], [place] [address] [phone]  Note: This document was prepared with digital dictation and possible smart phrase technology. Any transcriptional errors that result from this process are unintentional.

## 2014-01-01 NOTE — Progress Notes (Signed)
I agree above plan. 

## 2014-03-02 ENCOUNTER — Emergency Department (HOSPITAL_COMMUNITY)
Admission: EM | Admit: 2014-03-02 | Discharge: 2014-03-02 | Disposition: A | Discharge status: Home / Self Care | Payer: Medicaid Other | Attending: Emergency Medicine | Admitting: Emergency Medicine

## 2014-03-02 ENCOUNTER — Encounter (HOSPITAL_COMMUNITY): Payer: Self-pay | Admitting: *Deleted

## 2014-03-02 DIAGNOSIS — H5412 Blindness, left eye, low vision right eye: Secondary | ICD-10-CM | POA: Diagnosis not present

## 2014-03-02 DIAGNOSIS — R51 Headache: Secondary | ICD-10-CM | POA: Insufficient documentation

## 2014-03-02 DIAGNOSIS — R1084 Generalized abdominal pain: Secondary | ICD-10-CM | POA: Diagnosis not present

## 2014-03-02 DIAGNOSIS — H913 Deaf nonspeaking, not elsewhere classified: Secondary | ICD-10-CM | POA: Insufficient documentation

## 2014-03-02 DIAGNOSIS — E119 Type 2 diabetes mellitus without complications: Secondary | ICD-10-CM | POA: Insufficient documentation

## 2014-03-02 DIAGNOSIS — Z3202 Encounter for pregnancy test, result negative: Secondary | ICD-10-CM | POA: Diagnosis not present

## 2014-03-02 DIAGNOSIS — Z79899 Other long term (current) drug therapy: Secondary | ICD-10-CM | POA: Insufficient documentation

## 2014-03-02 DIAGNOSIS — J029 Acute pharyngitis, unspecified: Secondary | ICD-10-CM | POA: Insufficient documentation

## 2014-03-02 DIAGNOSIS — R519 Headache, unspecified: Secondary | ICD-10-CM

## 2014-03-02 LAB — COMPREHENSIVE METABOLIC PANEL
ALT: 18 U/L (ref 0–35)
AST: 21 U/L (ref 0–37)
Albumin: 4 g/dL (ref 3.5–5.2)
Alkaline Phosphatase: 60 U/L (ref 39–117)
Anion gap: 12 (ref 5–15)
BUN: <5 mg/dL — ABNORMAL LOW (ref 6–23)
CO2: 28 mmol/L (ref 19–32)
Calcium: 9.9 mg/dL (ref 8.4–10.5)
Chloride: 101 mEq/L (ref 96–112)
Creatinine, Ser: 0.47 mg/dL — ABNORMAL LOW (ref 0.50–1.10)
GFR calc Af Amer: >90 mL/min (ref >90)
GFR calc non Af Amer: >90 mL/min (ref >90)
Glucose, Bld: 110 mg/dL — ABNORMAL HIGH (ref 70–99)
Potassium: 3.9 mmol/L (ref 3.5–5.1)
Sodium: 141 mmol/L (ref 135–145)
Total Bilirubin: 0.4 mg/dL (ref 0.3–1.2)
Total Protein: 7.3 g/dL (ref 6.0–8.3)

## 2014-03-02 LAB — CBC WITH DIFFERENTIAL/PLATELET
Basophils Absolute: 0 10*3/uL (ref 0.0–0.1)
Basophils Relative: 0 % (ref 0–1)
Eosinophils Absolute: 0.1 10*3/uL (ref 0.0–0.7)
Eosinophils Relative: 1 % (ref 0–5)
HCT: 42.6 % (ref 36.0–46.0)
Hemoglobin: 14.9 g/dL (ref 12.0–15.0)
Lymphocytes Relative: 42 % (ref 12–46)
Lymphs Abs: 3.8 10*3/uL (ref 0.7–4.0)
MCH: 30.6 pg (ref 26.0–34.0)
MCHC: 35 g/dL (ref 30.0–36.0)
MCV: 87.5 fL (ref 78.0–100.0)
Monocytes Absolute: 0.5 10*3/uL (ref 0.1–1.0)
Monocytes Relative: 5 % (ref 3–12)
Neutro Abs: 4.8 10*3/uL (ref 1.7–7.7)
Neutrophils Relative %: 52 % (ref 43–77)
Platelets: 248 10*3/uL (ref 150–400)
RBC: 4.87 MIL/uL (ref 3.87–5.11)
RDW: 12.8 % (ref 11.5–15.5)
WBC: 9.2 10*3/uL (ref 4.0–10.5)

## 2014-03-02 LAB — URINALYSIS, ROUTINE W REFLEX MICROSCOPIC
Bilirubin Urine: NEGATIVE
Glucose, UA: NEGATIVE mg/dL
Hgb urine dipstick: NEGATIVE
Ketones, ur: NEGATIVE mg/dL
Nitrite: NEGATIVE
Protein, ur: NEGATIVE mg/dL
Specific Gravity, Urine: 1.013 (ref 1.005–1.030)
Urobilinogen, UA: 0.2 mg/dL (ref 0.0–1.0)
pH: 7 (ref 5.0–8.0)

## 2014-03-02 LAB — LIPASE, BLOOD: Lipase: 57 U/L (ref 11–59)

## 2014-03-02 LAB — POC URINE PREG, ED: Preg Test, Ur: NEGATIVE

## 2014-03-02 LAB — URINE MICROSCOPIC-ADD ON

## 2014-03-02 LAB — RAPID STREP SCREEN (MED CTR MEBANE ONLY): Streptococcus, Group A Screen (Direct): NEGATIVE

## 2014-03-02 MED ORDER — IBUPROFEN 400 MG PO TABS
600.0000 mg | ORAL_TABLET | Freq: Once | ORAL | Status: AC
  Administered 2014-03-02: 600 mg via ORAL
  Filled 2014-03-02 (×2): qty 1

## 2014-03-02 NOTE — ED Notes (Addendum)
Pt has mR, Family reports pt having sore throat, fever yesterday and now abd is distended and appears to be having abd pain. Denies vomiting or diarrhea. Last menstrual cycle was 5 months ago.

## 2014-03-02 NOTE — ED Provider Notes (Signed)
CSN: [id number]     Arrival date & time 03/02/14  1451 History   First MD Initiated Contact with Patient 03/02/14 1729     Chief Complaint  Patient presents with  . Headache  . Sore Throat  . Abdominal Pain     (Consider location/radiation/quality/duration/timing/severity/associated sxs/prior Treatment) Patient is a 29 y.o. female presenting with pharyngitis. The history is provided by a parent. A language interpreter was used.  Sore Throat This is a new problem. The current episode started yesterday. The problem occurs constantly. The problem has been unchanged. Associated symptoms include abdominal pain (generalized), a fever (subjective) and headaches. Pertinent negatives include no vomiting. Nothing aggravates the symptoms. She has tried nothing for the symptoms.    Past Medical History  Diagnosis Date  . Blind left eye   . Mental retardation   . Diabetes mellitus without complication   . Deaf    Past Surgical History  Procedure Laterality Date  . None     Family History  Problem Relation Age of Onset  . Hypertension Mother   . Arthritis Mother   . Heart disease Mother    History  Substance Use Topics  . Smoking status: Never Smoker   . Smokeless tobacco: Never Used  . Alcohol Use: No   OB History    No data available     Review of Systems  Unable to perform ROS: Patient nonverbal  Constitutional: Positive for fever (subjective).  Gastrointestinal: Positive for abdominal pain (generalized). Negative for vomiting.  Neurological: Positive for headaches.      Allergies  Review of patient's allergies indicates no known allergies.  Home Medications   Prior to Admission medications   Medication Sig Start Date End Date Taking? Authorizing Provider  metFORMIN (GLUCOPHAGE) 500 MG tablet Take 1 tablet (500 mg total) by mouth 2 (two) times daily with a meal. 06/27/13  Yes Olugbemiga E Hyman Hopes, MD  risperiDONE (RISPERDAL) 1 MG tablet Take 1 tablet (1 mg total) by  mouth 3 (three) times daily. 03/14/13  Yes Levert Feinstein, MD   BP 131/98 mmHg  Pulse 103  Temp(Src) 97.9 F (36.6 C) (Oral)  Resp 12  SpO2 98% Physical Exam  Constitutional: She is oriented to person, place, and time. She appears well-developed and well-nourished. No distress.  HENT:  Head: Normocephalic and atraumatic.  Mouth/Throat: No trismus in the jaw. No dental abscesses or dental caries.  Unable to fully evaluate the oropharynx since the patient does not cooperate with the exam.  The visualized portion does not demonstrate any erythema.  No evidence of trismus.    Eyes: Pupils are equal, round, and reactive to light.  Neck: Normal range of motion.  Cardiovascular: Normal rate, regular rhythm, normal heart sounds and intact distal pulses.   Pulmonary/Chest: Effort normal. No respiratory distress. She has no wheezes. She exhibits no tenderness.  Abdominal: Soft. Bowel sounds are normal. She exhibits no distension. There is no tenderness. There is no rebound and no guarding.  Neurological: She is alert and oriented to person, place, and time. She has normal strength. No cranial nerve deficit or sensory deficit. She exhibits normal muscle tone. Coordination and gait normal.  Skin: Skin is warm and dry.  Nursing note and vitals reviewed.   ED Course  Procedures (including critical care time) Labs Review Labs Reviewed  COMPREHENSIVE METABOLIC PANEL - Abnormal; Notable for the following:    Glucose, Bld 110 (*)    BUN <5 (*)    Creatinine, Ser 0.47 (*)  All other components within normal limits  URINALYSIS, ROUTINE W REFLEX MICROSCOPIC - Abnormal; Notable for the following:    Leukocytes, UA SMALL (*)    All other components within normal limits  URINE MICROSCOPIC-ADD ON - Abnormal; Notable for the following:    Squamous Epithelial / LPF FEW (*)    Bacteria, UA FEW (*)    All other components within normal limits  RAPID STREP SCREEN  CULTURE, GROUP A STREP  CBC WITH  DIFFERENTIAL  LIPASE, BLOOD  POC URINE PREG, ED    Imaging Review No results found.   EKG Interpretation None      MDM   Final diagnoses:  Nonintractable headache, unspecified chronicity pattern, unspecified headache type  Generalized abdominal pain  Sore throat    Patient is a 29 year old Arabic female with pertinent past medical history mental retardation who comes to the emergency department today with headache, abdominal pain, and sore throat for the past 2 days. Physical exam as above. With no cough or shortness of breath doubt pneumonia.  The mother reports that when the patient becomes ill she develops headaches similar to these. She reports that the last time she had these symptoms she was seen in our emergency department in August and received a prescription for antibiotics which improved her symptoms. She feels this is what the patient requires today. Initial work-up included a UA, urine pregnancy, CBC, CMP, and lipase. With the patient's sore throat a strep swab was obtained. UA demonstrated small leukocytes, a few epithelial cells and a few bacteria.  I do not feel this is consistent with urinary tract infection and is likely a contamination.  CBC was unremarkable. CMP was unremarkable. Lipase 57. CT pregnancy was negative. Rapid strep screen was negative.  As a result I doubt a strep pharyngitis. The patient has no changes in her mental status per her mother.  She has no changes in her neurologic exam per her mother.  As result I doubt a meningitis. I feel the patient is stable for discharge at this time. She was treated with Motrin for her headache. She was instructed to follow-up with primary care physician in a week and to return to the emergency department with worsening headaches, weakness, numbness, fevers, chills, or any other concerns.  The mother expressed understanding.  Patient was discharged in a good condition.  Labs were reviewed by myself and considered in medical  decision making.  Care was discussed with my attending Dr. Anitra LauthPlunkett.     Bethann BerkshireAaron Myrtle Barnhard, MD 03/03/14 [id number]  Gwyneth SproutWhitney Plunkett, MD 03/03/14 2352

## 2014-03-02 NOTE — ED Notes (Signed)
Spoke with pt and pts mother via Nurse, learning disabilitytranslator phone. Pts mother speaks arabic. Mother states that pts throat has been hurting. States that her abdomen is more swollen. States that when she asks her daughter about pain she points to her head so she thinks that her throat and head hurt. Denies nausea/vomitting/diarrhea. States she had a BM today. States she has not had a period in 4 months but this is normal for her. pts mother refuses for pt to wear a gown. Pt would not allow nurse to look in her throat. Nurse did notice broken teeth in mouth.

## 2014-03-04 LAB — CULTURE, GROUP A STREP

## 2014-03-15 ENCOUNTER — Encounter: Payer: Self-pay | Admitting: Family Medicine

## 2014-03-15 ENCOUNTER — Ambulatory Visit: Payer: Medicaid Other | Attending: Family Medicine | Admitting: Family Medicine

## 2014-03-15 VITALS — BP 122/83 | HR 115 | Ht 61.0 in | Wt 136.0 lb

## 2014-03-15 DIAGNOSIS — E119 Type 2 diabetes mellitus without complications: Secondary | ICD-10-CM | POA: Diagnosis not present

## 2014-03-15 DIAGNOSIS — Z23 Encounter for immunization: Secondary | ICD-10-CM

## 2014-03-15 DIAGNOSIS — H1012 Acute atopic conjunctivitis, left eye: Secondary | ICD-10-CM

## 2014-03-15 DIAGNOSIS — J309 Allergic rhinitis, unspecified: Secondary | ICD-10-CM

## 2014-03-15 LAB — GLUCOSE, POCT (MANUAL RESULT ENTRY): POC Glucose: 194 mg/dl — AB (ref 70–99)

## 2014-03-15 MED ORDER — ACCU-CHEK SOFTCLIX LANCET DEV MISC: Status: DC

## 2014-03-15 MED ORDER — FLUTICASONE PROPIONATE 50 MCG/ACT NA SUSP: 2.0000 | Freq: Every day | NASAL | Status: DC

## 2014-03-15 MED ORDER — CETIRIZINE HCL 10 MG PO TABS: 10.0000 mg | ORAL_TABLET | Freq: Every day | ORAL | Status: DC

## 2014-03-15 MED ORDER — GLUCOSE BLOOD VI STRP: ORAL_STRIP | Status: DC

## 2014-03-15 NOTE — Assessment & Plan Note (Signed)
A;. Diabetes type 2. Random blood sugar today is normal.  P:  F/u with PCP in 4-6 weeks for DM2 management.

## 2014-03-15 NOTE — Addendum Note (Signed)
Addended by: Nonnie DoneSMITH, Giomar Gusler D on: 03/15/2014 10:41 AM   Modules accepted: Orders

## 2014-03-15 NOTE — Progress Notes (Signed)
   Subjective:    Patient ID: Makayla Roberts, female    DOB: Sep 21, 1985, 29 y.o.   MRN: [id number] CC; L pink eye  HPI 29 yo F with cognitive impairment. Arabic interpreter present.  1. L pink eye: L eye gets intermittently pink. Most recently pink for 2-3 days. She was not allowed to go to school without seeing a doctor. Previous time her eye was pink for about 4 weeks. No drainage, swelling, crusting, fever, itching. No close contacts with pink eye. She does not see from her L eye.   Soc Hx: non smoker  Review of Systems As per HPI     Objective:   Physical Exam BP 122/83 mmHg  Pulse 115  Ht 5\' 1"  (1.549 m)  Wt 136 lb (61.689 kg)  BMI 25.71 kg/m2  SpO2 98% General appearance: alert, cooperative and no distress Head: Normocephalic, without obvious abnormality, atraumatic Eyes: R eye PERRLA EOMI, L eye cornea cloudy, mild medial conjunctival injection, no discharge, EOMI, pupil non recative  Ears: normal TM's and external ear canals both ears Nose: mucoid discharge, turbinates pink, swollen    Assessment & Plan:

## 2014-03-15 NOTE — Patient Instructions (Addendum)
Makayla Roberts,  Thank you for coming in today.  1. Allergic rhinoconjunctivitis Zyrtec 1 tab in the morning flonase spray into each nostril at night before bed  2. Diabetes type 2: Random blood sugar today is normal.   F/u in 4-6 weeks with Dr. Hyman Hopes for diabetes follow up   Dr. Armen Pickup   Allergic Conjunctivitis The conjunctiva is a thin membrane that covers the visible white part of the eyeball and the underside of the eyelids. This membrane protects and lubricates the eye. The membrane has small blood vessels running through it that can normally be seen. When the conjunctiva becomes inflamed, the condition is called conjunctivitis. In response to the inflammation, the conjunctival blood vessels become swollen. The swelling results in redness in the normally white part of the eye. The blood vessels of this membrane also react when a person has allergies and is then called allergic conjunctivitis. This condition usually lasts for as long as the allergy persists. Allergic conjunctivitis cannot be passed to another person (non-contagious). The likelihood of bacterial infection is great and the cause is not likely due to allergies if the inflamed eye has:  A sticky discharge.  Discharge or sticking together of the lids in the morning.  Scaling or flaking of the eyelids where the eyelashes come out.  Red swollen eyelids. CAUSES   Viruses.  Irritants such as foreign bodies.  Chemicals.  General allergic reactions.  Inflammation or serious diseases in the inside or the outside of the eye or the orbit (the boney cavity in which the eye sits) can cause a "red eye." SYMPTOMS   Eye redness.  Tearing.  Itchy eyes.  Burning feeling in the eyes.  Clear drainage from the eye.  Allergic reaction due to pollens or ragweed sensitivity. Seasonal allergic conjunctivitis is frequent in the spring when pollens are in the air and in the fall. DIAGNOSIS  This condition, in its many forms,  is usually diagnosed based on the history and an ophthalmological exam. It usually involves both eyes. If your eyes react at the same time every year, allergies may be the cause. While most "red eyes" are due to allergy or an infection, the role of an eye (ophthalmological) exam is important. The exam can rule out serious diseases of the eye or orbit. TREATMENT   Non-antibiotic eye drops, ointments, or medications by mouth may be prescribed if the ophthalmologist is sure the conjunctivitis is due to allergies alone.  Over-the-counter drops and ointments for allergic symptoms should be used only after other causes of conjunctivitis have been ruled out, or as your caregiver suggests. Medications by mouth are often prescribed if other allergy-related symptoms are present. If the ophthalmologist is sure that the conjunctivitis is due to allergies alone, treatment is normally limited to drops or ointments to reduce itching and burning. HOME CARE INSTRUCTIONS   Wash hands before and after applying drops or ointments, or touching the inflamed eye(s) or eyelids.  Do not let the eye dropper tip or ointment tube touch the eyelid when putting medicine in your eye.  Stop using your soft contact lenses and throw them away. Use a new pair of lenses when recovery is complete. You should run through sterilizing cycles at least three times before use after complete recovery if the old soft contact lenses are to be used. Hard contact lenses should be stopped. They need to be thoroughly sterilized before use after recovery.  Itching and burning eyes due to allergies is often relieved by using  a cool cloth applied to closed eye(s). SEEK MEDICAL CARE IF:   Your problems do not go away after two or three days of treatment.  Your lids are sticky (especially in the morning when you wake up) or stick together.  Discharge develops. Antibiotics may be needed either as drops, ointment, or by mouth.  You have extreme  light sensitivity.  An oral temperature above 102 F (38.9 C) develops.  Pain in or around the eye or any other visual symptom develops. MAKE SURE YOU:   Understand these instructions.  Will watch your condition.  Will get help right away if you are not doing well or get worse. Document Released: 04/25/2002 Document Revised: 04/27/2011 Document Reviewed: 03/21/2007 [facility] Patient Information 2015 [employer] [place]. This information is not intended to replace advice given to you by your health care provider. Make sure you discuss any questions you have with your health care provider.

## 2014-03-15 NOTE — Assessment & Plan Note (Addendum)
A; exam and history consistent with allergic etiology P: Daily antihistamine, cetirizine  Nightly nasal steroid, flonase Letter for school

## 2014-03-15 NOTE — Progress Notes (Signed)
Pt is c/o of having redness, itching in both eyes, but the left is worse x 4 weeks. Pt was sent home from school because of this. Pt mother denies any drainage from the eye.  Interpreter is present.

## 2014-04-09 ENCOUNTER — Other Ambulatory Visit: Payer: Self-pay | Admitting: Neurology

## 2014-04-19 ENCOUNTER — Ambulatory Visit: Payer: Medicaid Other | Attending: Internal Medicine | Admitting: Internal Medicine

## 2014-04-19 ENCOUNTER — Encounter: Payer: Self-pay | Admitting: Internal Medicine

## 2014-04-19 VITALS — BP 141/92 | HR 96 | Resp 16 | Wt 135.6 lb

## 2014-04-19 DIAGNOSIS — H5442 Blindness, left eye, normal vision right eye: Secondary | ICD-10-CM | POA: Diagnosis not present

## 2014-04-19 DIAGNOSIS — H9190 Unspecified hearing loss, unspecified ear: Secondary | ICD-10-CM | POA: Diagnosis not present

## 2014-04-19 DIAGNOSIS — Z79899 Other long term (current) drug therapy: Secondary | ICD-10-CM | POA: Diagnosis not present

## 2014-04-19 DIAGNOSIS — E119 Type 2 diabetes mellitus without complications: Secondary | ICD-10-CM | POA: Diagnosis present

## 2014-04-19 DIAGNOSIS — E139 Other specified diabetes mellitus without complications: Secondary | ICD-10-CM | POA: Diagnosis not present

## 2014-04-19 DIAGNOSIS — F79 Unspecified intellectual disabilities: Secondary | ICD-10-CM | POA: Diagnosis not present

## 2014-04-19 LAB — POCT GLYCOSYLATED HEMOGLOBIN (HGB A1C): Hemoglobin A1C: 6.2

## 2014-04-19 LAB — GLUCOSE, POCT (MANUAL RESULT ENTRY): POC Glucose: 200 mg/dl — AB (ref 70–99)

## 2014-04-19 MED ORDER — GLUCOSE BLOOD VI STRP: ORAL_STRIP | Status: DC

## 2014-04-19 MED ORDER — METFORMIN HCL 500 MG PO TABS: 500.0000 mg | ORAL_TABLET | Freq: Two times a day (BID) | ORAL | Status: DC

## 2014-04-19 NOTE — Progress Notes (Signed)
Patient here with family and interpreter Complains of headache for almost a month Bilateral eyes red with discharge

## 2014-04-19 NOTE — Patient Instructions (Signed)
Basic Carbohydrate Counting for Diabetes Mellitus Carbohydrate counting is a method for keeping track of the amount of carbohydrates you eat. Eating carbohydrates naturally increases the level of sugar (glucose) in your blood, so it is important for you to know the amount that is okay for you to have in every meal. Carbohydrate counting helps keep the level of glucose in your blood within normal limits. The amount of carbohydrates allowed is different for every person. A dietitian can help you calculate the amount that is right for you. Once you know the amount of carbohydrates you can have, you can count the carbohydrates in the foods you want to eat. Carbohydrates are found in the following foods:  Grains, such as breads and cereals.  Dried beans and soy products.  Starchy vegetables, such as potatoes, peas, and corn.  Fruit and fruit juices.  Milk and yogurt.  Sweets and snack foods, such as cake, cookies, candy, chips, soft drinks, and fruit drinks. CARBOHYDRATE COUNTING There are two ways to count the carbohydrates in your food. You can use either of the methods or a combination of both. Reading the "Nutrition Facts" on Packaged Food The "Nutrition Facts" is an area that is included on the labels of almost all packaged food and beverages in the [place]. It includes the serving size of that food or beverage and information about the nutrients in each serving of the food, including the grams (g) of carbohydrate per serving.  Decide the number of servings of this food or beverage that you will be able to eat or drink. Multiply that number of servings by the number of grams of carbohydrate that is listed on the label for that serving. The total will be the amount of carbohydrates you will be having when you eat or drink this food or beverage. Learning Standard Serving Sizes of Food When you eat food that is not packaged or does not include "Nutrition Facts" on the label, you need to  measure the servings in order to count the amount of carbohydrates.A serving of most carbohydrate-rich foods contains about 15 g of carbohydrates. The following list includes serving sizes of carbohydrate-rich foods that provide 15 g ofcarbohydrate per serving:   1 slice of bread (1 oz) or 1 six-inch tortilla.    of a hamburger bun or English muffin.  4-6 crackers.   cup unsweetened dry cereal.    cup hot cereal.   cup rice or pasta.    cup mashed potatoes or  of a large baked potato.  1 cup fresh fruit or one small piece of fruit.    cup canned or frozen fruit or fruit juice.  1 cup milk.   cup plain fat-free yogurt or yogurt sweetened with artificial sweeteners.   cup cooked dried beans or starchy vegetable, such as peas, corn, or potatoes.  Decide the number of standard-size servings that you will eat. Multiply that number of servings by 15 (the grams of carbohydrates in that serving). For example, if you eat 2 cups of strawberries, you will have eaten 2 servings and 30 g of carbohydrates (2 servings x 15 g = 30 g). For foods such as soups and casseroles, in which more than one food is mixed in, you will need to count the carbohydrates in each food that is included. EXAMPLE OF CARBOHYDRATE COUNTING Sample Dinner  3 oz chicken breast.   cup of brown rice.   cup of corn.  1 cup milk.   1 cup strawberries with   sugar-free whipped topping.  Carbohydrate Calculation Step 1: Identify the foods that contain carbohydrates:   Rice.   Corn.   Milk.   Strawberries. Step 2:Calculate the number of servings eaten of each:   2 servings of rice.   1 serving of corn.   1 serving of milk.   1 serving of strawberries. Step 3: Multiply each of those number of servings by 15 g:   2 servings of rice x 15 g = 30 g.   1 serving of corn x 15 g = 15 g.   1 serving of milk x 15 g = 15 g.   1 serving of strawberries x 15 g = 15 g. Step 4: Add  together all of the amounts to find the total grams of carbohydrates eaten: 30 g + 15 g + 15 g + 15 g = 75 g. Document Released: 02/02/2005 Document Revised: 06/19/2013 Document Reviewed: 12/30/2012 ExitCare Patient Information 2015 [employer]. This information is not intended to replace advice given to you by your health care provider. Make sure you discuss any questions you have with your health care provider. Diabetes and Exercise Exercising regularly is important. It is not just about losing weight. It has many health benefits, such as:  Improving your overall fitness, flexibility, and endurance.  Increasing your bone density.  Helping with weight control.  Decreasing your body fat.  Increasing your muscle strength.  Reducing stress and tension.  Improving your overall health. People with diabetes who exercise gain additional benefits because exercise:  Reduces appetite.  Improves the body's use of blood sugar (glucose).  Helps lower or control blood glucose.  Decreases blood pressure.  Helps control blood lipids (such as cholesterol and triglycerides).  Improves the body's use of the hormone insulin by:  Increasing the body's insulin sensitivity.  Reducing the body's insulin needs.  Decreases the risk for heart disease because exercising:  Lowers cholesterol and triglycerides levels.  Increases the levels of good cholesterol (such as high-density lipoproteins [HDL]) in the body.  Lowers blood glucose levels. YOUR ACTIVITY PLAN  Choose an activity that you enjoy and set realistic goals. Your health care provider or diabetes educator can help you make an activity plan that works for you. Exercise regularly as directed by your health care provider. This includes:  Performing resistance training twice a week such as push-ups, sit-ups, lifting weights, or using resistance bands.  Performing 150 minutes of cardio exercises each week such as walking, running, or  playing sports.  Staying active and spending no more than 90 minutes at one time being inactive. Even short bursts of exercise are good for you. Three 10-minute sessions spread throughout the day are just as beneficial as a single 30-minute session. Some exercise ideas include:  Taking the dog for a walk.  Taking the stairs instead of the elevator.  Dancing to your favorite song.  Doing an exercise video.  Doing your favorite exercise with a friend. RECOMMENDATIONS FOR EXERCISING WITH TYPE 1 OR TYPE 2 DIABETES   Check your blood glucose before exercising. If blood glucose levels are greater than 240 mg/dL, check for urine ketones. Do not exercise if ketones are present.  Avoid injecting insulin into areas of the body that are going to be exercised. For example, avoid injecting insulin into:  The arms when playing tennis.  The legs when jogging.  Keep a record of:  Food intake before and after you exercise.  Expected peak times of insulin action.  Blood   glucose levels before and after you exercise.  The type and amount of exercise you have done.  Review your records with your health care provider. Your health care provider will help you to develop guidelines for adjusting food intake and insulin amounts before and after exercising.  If you take insulin or oral hypoglycemic agents, watch for signs and symptoms of hypoglycemia. They include:  Dizziness.  Shaking.  Sweating.  Chills.  Confusion.  Drink plenty of water while you exercise to prevent dehydration or heat stroke. Body water is lost during exercise and must be replaced.  Talk to your health care provider before starting an exercise program to make sure it is safe for you. Remember, almost any type of activity is better than none. Document Released: 04/25/2003 Document Revised: 06/19/2013 Document Reviewed: 07/12/2012 ExitCare Patient Information 2015 [employer]. This information is not intended to  replace advice given to you by your health care provider. Make sure you discuss any questions you have with your health care provider.  

## 2014-04-19 NOTE — Progress Notes (Signed)
Patient ID: Makayla Roberts, female   DOB: 04/13/1985, 29 y.o.   MRN: [id number]   Makayla Roberts, is a 29 y.o. female  [id number]  [id number]  DOB - 04/13/1985  Chief Complaint  Patient presents with  . Follow-up        Subjective:   Makayla Roberts is a 29 y.o. female here today for a follow up visit. Patient accompanied by mother, interpreter and the social worker at today's clinic visit. 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 Patient was recently seen for possible allergic rhinoconjunctivitis and was given eyedrops, family claims is helping. Patient has no new complaint. Patient is compliant with medications. She is currently on metformin 500 mg tablet by mouth twice a day and risperidone 1 mg tablet by mouth 3 times a day for agitation. Patient has No headache, No chest pain, No abdominal pain - No Nausea, No new weakness tingling or numbness, No Cough - SOB.  Problem  Other Specified Diabetes Mellitus Without Complications    ALLERGIES: No Known Allergies  PAST MEDICAL HISTORY: Past Medical History  Diagnosis Date  . Blind left eye   . Mental retardation   . Diabetes mellitus without complication   . Deaf     MEDICATIONS AT HOME: Prior to Admission medications   Medication Sig Start Date End Date Taking? Authorizing Provider  cetirizine (ZYRTEC) 10 MG tablet Take 1 tablet (10 mg total) by mouth daily. 03/15/14   Josalyn C Funches, MD  fluticasone (FLONASE) 50 MCG/ACT nasal spray Place 2 sprays into both nostrils at bedtime. 03/15/14   Josalyn C Funches, MD  glucose blood (ACCU-CHEK AVIVA) test strip Use as instructed 04/19/14   Quentin Angstlugbemiga E Amaia Lavallie, MD  Lancet Devices (ACCU-CHEK [facility]) lancets Use as instructed 03/15/14   Lora PaulaJosalyn C Funches, MD  metFORMIN (GLUCOPHAGE) 500 MG tablet Take 1 tablet (500 mg total) by mouth 2 (two) times daily  with a meal. 04/19/14   Quentin Angstlugbemiga E Edna Grover, MD  risperiDONE (RISPERDAL) 1 MG tablet Take 1 tablet (1 mg total) by mouth 3 (three) times daily. 04/09/14   Levert FeinsteinYijun Yan, MD     Objective:   Filed Vitals:   04/19/14 1210  BP: 141/92  Pulse: 96  Resp: 16  Weight: 135 lb 9.6 oz (61.508 kg)  SpO2: 100%    Exam General appearance : Awake, alert, not in any distress. Nonverbal. Not toxic looking, repetitive body movement HEENT: Atraumatic and Normocephalic, pupils equally reactive to light and accomodation, left cornea clouded with mild conjunctiva injection, no discharge Neck: supple, no JVD. No cervical lymphadenopathy.  Chest:Good air entry bilaterally, no added sounds  CVS: S1 S2 regular, no murmurs.  Abdomen: Bowel sounds present, Non tender and not distended with no gaurding, rigidity or rebound. Extremities: B/L Lower Ext shows no edema, both legs are warm to touch Neurology: Awake alert, and oriented X 3, CN II-XII intact, Non focal  Data Review Lab Results  Component Value Date   HGBA1C 6.20 04/19/2014   HGBA1C 6.7 06/27/2013   HGBA1C 6.5 03/02/2013     Assessment & Plan   1. Other specified diabetes mellitus without complications  - Glucose (CBG) - HgB A1c is 6.2% today down from 6.7% - glucose blood (ACCU-CHEK AVIVA) test strip; Use as instructed  Dispense: 100 each; Refill: 12 - metFORMIN (GLUCOPHAGE) 500 MG tablet; Take 1 tablet (500 mg total) by mouth 2 (two) times  daily with a meal.  Dispense: 180 tablet; Refill: 3  Patient was counseled extensively about nutrition and exercise.   Aim for 2-3 Carb Choices per meal (30-45 grams) +/- 1 either way  Aim for 0-15 Carbs per snack if hungry  Include protein in moderation with your meals and snacks  Consider reading food labels for Total Carbohydrate and Fat Grams of foods  Consider checking BG at alternate times per day  Continue taking medication as directed Fruit Punch - find one with no sugar  Measure and decrease  portions of carbohydrate foods  Make your plate and don't go back for seconds  Interpreter was used to communicate directly with patient for the entire encounter including providing detailed patient instructions.    Return in about 3 months (around 07/20/2014) for Hemoglobin A1C and Follow up, DM.  The patient was given clear instructions to go to ER or return to medical center if symptoms don't improve, worsen or new problems develop. The patient verbalized understanding. The patient was told to call to get lab results if they haven't heard anything in the next week.   This note has been created with Education officer, environmental. Any transcriptional errors are unintentional.    Jeanann Lewandowsky, MD, MHA, FACP, FAAP [facility] [place], [place] [phone]   04/19/2014, 12:37 PM

## 2014-05-07 ENCOUNTER — Other Ambulatory Visit: Payer: Self-pay | Admitting: Family Medicine

## 2014-06-05 ENCOUNTER — Telehealth: Payer: Self-pay

## 2014-06-05 NOTE — Telephone Encounter (Signed)
[place] [employer] been called interpreter called with me on the Line rescheduled her May 10 th apt with Eber JonesCarolyn reschedule date will be June 9 th arrive at 1:15 pm. Interpreter spoke with mother she was ok with change. [place] will set up interpreter to be with her at apt. [phone] interpreter line.

## 2014-06-26 ENCOUNTER — Ambulatory Visit: Payer: Medicaid Other | Admitting: Nurse Practitioner

## 2014-07-26 ENCOUNTER — Encounter: Payer: Self-pay | Admitting: Nurse Practitioner

## 2014-07-26 ENCOUNTER — Ambulatory Visit (INDEPENDENT_AMBULATORY_CARE_PROVIDER_SITE_OTHER): Payer: Medicaid Other | Admitting: Nurse Practitioner

## 2014-07-26 VITALS — BP 130/93 | HR 120 | Ht 58.75 in | Wt 137.0 lb

## 2014-07-26 DIAGNOSIS — R479 Unspecified speech disturbances: Secondary | ICD-10-CM | POA: Diagnosis not present

## 2014-07-26 DIAGNOSIS — F79 Unspecified intellectual disabilities: Secondary | ICD-10-CM | POA: Diagnosis not present

## 2014-07-26 DIAGNOSIS — F89 Unspecified disorder of psychological development: Secondary | ICD-10-CM | POA: Diagnosis not present

## 2014-07-26 MED ORDER — RISPERIDONE 1 MG PO TABS: 1.0000 mg | ORAL_TABLET | Freq: Three times a day (TID) | ORAL | Status: DC

## 2014-07-26 NOTE — Patient Instructions (Signed)
Continue Risperdal at the current dose will refill She needs to exercise, this will help her diabetes Healthy diet Follow-up in 6 to 8 months

## 2014-07-26 NOTE — Progress Notes (Signed)
[facility]  PATIENT: Makayla Roberts DOB: 1985-07-18   REASON FOR VISIT: Follow-up for mental retardation, impaired speech and behavior issues  HISTORY FROM: Mother and interpreter    HISTORY OF PRESENT ILLNESS:Makayla Roberts is a 29 year old female with a history of mental retardation, impaired speech and behavioral issues. The patient returns today for followup and interpreter is present. She is currently taken Risperdal and mom reports that it is working well for her. She states that the patient is eating a lot. Blood sugars have been elevated, today's reading was 197. Family state that she is doing better since starting school. She goes to school 3 days a week and stays there for 8 hours. She needs refills on her medication   HISTORY 06/23/13 (CM): Makayla Roberts, 29 year old female returns today with her mother and an interpreter. Patient had been easily agitated and seen by Dr. Terrace Arabia 03/14/2013. This is much better on Risperdal. Patient was unable to have CT of the head due to the fact that she was uncooperative even with medication. Appetite has decreased as well since she has been on thyroid medication. She returns today for reevaluation. HISTORY 03/14/13 (YAN):: immigrated from [place] 3 months ago, accompanied by her mother, interpreter, and a Child psychotherapist at today's clinical visit  Mother had home delivery at [place] 29 years ago, it was a prolonged difficult delivery, she was blue and small when she was born, she was delayed in reaching her developmental milestones, began to walk at 29 years old, when she was 29 years old, physician at [place] told her mother that part of her brain was missing  she never went to school, developmentally delayed, was not able to speak, tends to have behavior issues  Since she moved to [place] 3 months ago, living in apartment with her mother, she no longer have other relatives to help taking care of her,  Mother felt overwhelming,  patient has gained a lot of weight also become agitated easily, she eats everything that she can get her hands on,  Sometimes she is out of control, began developing hair out of her head, also be violent towards her mother, she has difficulty sleeping,  There was no history of seizure, mother has 3 other children, that is all healthy      REVIEW OF SYSTEMS: Full 14 system review of systems performed and notable only for those listed, all others are neg:  Constitutional: Fatigue Cardiovascular: neg Ear/Nose/Throat: neg  Skin: neg Eyes: neg Respiratory: neg Gastroitestinal: neg  Hematology/Lymphatic: neg  Endocrine: Excessive thirst Musculoskeletal:neg Allergy/Immunology: neg Neurological: neg Psychiatric: Anxiety Sleep : neg   ALLERGIES: No Known Allergies  HOME MEDICATIONS: Outpatient Prescriptions Prior to Visit  Medication Sig Dispense Refill  . cetirizine (ZYRTEC) 10 MG tablet Take 1 tablet (10 mg total) by mouth daily. 90 tablet 1  . fluticasone (FLONASE) 50 MCG/ACT nasal spray Place 2 sprays into both nostrils at bedtime. 16 g 6  . glucose blood (ACCU-CHEK AVIVA) test strip Use as instructed 100 each 12  . Lancet Devices (ACCU-CHEK SOFTCLIX) lancets Use as instructed 1 each 0  . metFORMIN (GLUCOPHAGE) 500 MG tablet Take 1 tablet (500 mg total) by mouth 2 (two) times daily with a meal. 180 tablet 3  . risperiDONE (RISPERDAL) 1 MG tablet Take 1 tablet (1 mg total) by mouth 3 (three) times daily. 90 tablet 3   No facility-administered medications prior to visit.    PAST MEDICAL HISTORY: Past Medical History  Diagnosis Date  .  Blind left eye   . Mental retardation   . Diabetes mellitus without complication   . Deaf     PAST SURGICAL HISTORY: Past Surgical History  Procedure Laterality Date  . None      FAMILY HISTORY: Family History  Problem Relation Age of Onset  . Hypertension Mother   . Arthritis Mother   . Heart disease Mother     SOCIAL  HISTORY: History   Social History  . Marital Status: Single    Spouse Name: N/A  . Number of Children: N/A  . Years of Education: N/A   Occupational History  . Not on file.   Social History Main Topics  . Smoking status: Never Smoker   . Smokeless tobacco: Never Used  . Alcohol Use: No  . Drug Use: No  . Sexual Activity: No   Other Topics Concern  . Not on file   Social History Narrative   Patient lives at home with her mom (Zenind).   Education- None   Left handed.   Caffeine- Some hot tea      Patient Special Coordinator Evon Slack - [phone] -cell, [phone] and fax [phone].     PHYSICAL EXAM  Filed Vitals:   07/26/14 1336  BP: 130/93  Pulse: 137  Height: 4' 10.75" (1.492 m)  Weight: 137 lb (62.143 kg)   Body mass index is 27.92 kg/(m^2). Generalized: Well developed, in no acute distress   Neurological examination  Mentation: Nonverbal, unable to follow directions.  Cranial nerve II-XII: Left cornea opaque, right pupil irregular but reactive to light. . Extraocular movements were full. Facial symmetry. Motor: The motor testing reveals 5 over 5 strength of all 4 extremities. Good symmetric motor tone is noted throughout.  Sensory: could not assess properly withdraws to pain Coordination: could not assess Gait and station: ambulates independently and without difficulty.  Reflexes: Deep tendon reflexes are symmetric and normal bilaterally.    DIAGNOSTIC DATA (LABS, IMAGING, TESTING) - I reviewed patient records, labs, notes, testing and imaging myself where available.  Lab Results  Component Value Date   WBC 9.2 03/02/2014   HGB 14.9 03/02/2014   HCT 42.6 03/02/2014   MCV 87.5 03/02/2014   PLT 248 03/02/2014      Component Value Date/Time   NA 141 03/02/2014 1530   K 3.9 03/02/2014 1530   CL 101 03/02/2014 1530   CO2 28 03/02/2014 1530   GLUCOSE 110* 03/02/2014 1530   BUN <5* 03/02/2014 1530   CREATININE 0.47* 03/02/2014 1530    CREATININE 0.59 01/11/2013 1536   CALCIUM 9.9 03/02/2014 1530   PROT 7.3 03/02/2014 1530   ALBUMIN 4.0 03/02/2014 1530   AST 21 03/02/2014 1530   ALT 18 03/02/2014 1530   ALKPHOS 60 03/02/2014 1530   BILITOT 0.4 03/02/2014 1530   GFRNONAA >90 03/02/2014 1530   GFRNONAA >89 01/11/2013 1536   GFRAA >90 03/02/2014 1530   GFRAA >89 01/11/2013 1536     ASSESSMENT AND PLAN  29 y.o. year old female  has a past medical history of Blind left eye; Mental retardation; Diabetes mellitus without complication; and Deaf. Here to follow-up for 1. Mental retardation with developmental delays 2. Behavior issues  Continue Risperdal at the current dose will refill She needs to exercise, this will help her diabetes Healthy diet for overall health and well-being Follow-up in 6 to 8 months Nilda Riggs, [facility], [facility], APRN  [facility] [address], [address] [place],  [place] [address] [phone]

## 2014-07-27 NOTE — Progress Notes (Signed)
I have reviewed and agreed above plan. 

## 2014-08-23 ENCOUNTER — Ambulatory Visit: Payer: Medicaid Other | Attending: Internal Medicine | Admitting: Internal Medicine

## 2014-08-23 ENCOUNTER — Encounter: Payer: Self-pay | Admitting: Internal Medicine

## 2014-08-23 VITALS — BP 123/87 | HR 113 | Temp 98.3°F | Resp 18 | Ht 59.0 in | Wt 137.0 lb

## 2014-08-23 DIAGNOSIS — H9190 Unspecified hearing loss, unspecified ear: Secondary | ICD-10-CM | POA: Diagnosis not present

## 2014-08-23 DIAGNOSIS — H578 Other specified disorders of eye and adnexa: Secondary | ICD-10-CM | POA: Insufficient documentation

## 2014-08-23 DIAGNOSIS — F79 Unspecified intellectual disabilities: Secondary | ICD-10-CM | POA: Insufficient documentation

## 2014-08-23 DIAGNOSIS — E119 Type 2 diabetes mellitus without complications: Secondary | ICD-10-CM | POA: Diagnosis not present

## 2014-08-23 DIAGNOSIS — H5442 Blindness, left eye, normal vision right eye: Secondary | ICD-10-CM | POA: Insufficient documentation

## 2014-08-23 DIAGNOSIS — H5789 Other specified disorders of eye and adnexa: Secondary | ICD-10-CM

## 2014-08-23 DIAGNOSIS — Z79899 Other long term (current) drug therapy: Secondary | ICD-10-CM | POA: Insufficient documentation

## 2014-08-23 LAB — GLUCOSE, POCT (MANUAL RESULT ENTRY): POC Glucose: 206 mg/dl — AB (ref 70–99)

## 2014-08-23 LAB — POCT GLYCOSYLATED HEMOGLOBIN (HGB A1C): Hemoglobin A1C: 6.2

## 2014-08-23 MED ORDER — GLUCOSE BLOOD VI STRP: ORAL_STRIP | Status: DC

## 2014-08-23 MED ORDER — ACCU-CHEK SOFTCLIX LANCET DEV MISC: Status: DC

## 2014-08-23 MED ORDER — METFORMIN HCL 500 MG PO TABS: 500.0000 mg | ORAL_TABLET | Freq: Two times a day (BID) | ORAL | Status: DC

## 2014-08-23 MED ORDER — TETRAHYDROZOLINE HCL 0.05 % OP SOLN: 2.0000 [drp] | Freq: Three times a day (TID) | OPHTHALMIC | Status: DC

## 2014-08-23 NOTE — Progress Notes (Signed)
Patient is here for a follow up for DM. Patient denies any pain today. Patient CBG is 206. Patient A1C is 6.2%. Patient has taken her medications for today.   [employer]preter, Arij, was used for Arabic language. ID # [id number].

## 2014-08-23 NOTE — Patient Instructions (Signed)
Diabetes and Exercise Exercising regularly is important. It is not just about losing weight. It has many health benefits, such as:  Improving your overall fitness, flexibility, and endurance.  Increasing your bone density.  Helping with weight control.  Decreasing your body fat.  Increasing your muscle strength.  Reducing stress and tension.  Improving your overall health. People with diabetes who exercise gain additional benefits because exercise:  Reduces appetite.  Improves the body's use of blood sugar (glucose).  Helps lower or control blood glucose.  Decreases blood pressure.  Helps control blood lipids (such as cholesterol and triglycerides).  Improves the body's use of the hormone insulin by:  Increasing the body's insulin sensitivity.  Reducing the body's insulin needs.  Decreases the risk for heart disease because exercising:  Lowers cholesterol and triglycerides levels.  Increases the levels of good cholesterol (such as high-density lipoproteins [HDL]) in the body.  Lowers blood glucose levels. YOUR ACTIVITY PLAN  Choose an activity that you enjoy and set realistic goals. Your health care provider or diabetes educator can help you make an activity plan that works for you. Exercise regularly as directed by your health care provider. This includes:  Performing resistance training twice a week such as push-ups, sit-ups, lifting weights, or using resistance bands.  Performing 150 minutes of cardio exercises each week such as walking, running, or playing sports.  Staying active and spending no more than 90 minutes at one time being inactive. Even short bursts of exercise are good for you. Three 10-minute sessions spread throughout the day are just as beneficial as a single 30-minute session. Some exercise ideas include:  Taking the dog for a walk.  Taking the stairs instead of the elevator.  Dancing to your favorite song.  Doing an exercise  video.  Doing your favorite exercise with a friend. RECOMMENDATIONS FOR EXERCISING WITH TYPE 1 OR TYPE 2 DIABETES   Check your blood glucose before exercising. If blood glucose levels are greater than 240 mg/dL, check for urine ketones. Do not exercise if ketones are present.  Avoid injecting insulin into areas of the body that are going to be exercised. For example, avoid injecting insulin into:  The arms when playing tennis.  The legs when jogging.  Keep a record of:  Food intake before and after you exercise.  Expected peak times of insulin action.  Blood glucose levels before and after you exercise.  The type and amount of exercise you have done.  Review your records with your health care provider. Your health care provider will help you to develop guidelines for adjusting food intake and insulin amounts before and after exercising.  If you take insulin or oral hypoglycemic agents, watch for signs and symptoms of hypoglycemia. They include:  Dizziness.  Shaking.  Sweating.  Chills.  Confusion.  Drink plenty of water while you exercise to prevent dehydration or heat stroke. Body water is lost during exercise and must be replaced.  Talk to your health care provider before starting an exercise program to make sure it is safe for you. Remember, almost any type of activity is better than none. Document Released: 04/25/2003 Document Revised: 06/19/2013 Document Reviewed: 07/12/2012 ExitCare Patient Information 2015 [employer]. This information is not intended to replace advice given to you by your health care provider. Make sure you discuss any questions you have with your health care provider.  

## 2014-08-23 NOTE — Progress Notes (Signed)
Patient ID: Makayla Roberts, female   DOB: 06/17/1985, 29 y.o.   MRN: [id number]   Makayla Roberts, is a 29 y.o. female  [id number]  [id number]  DOB - 06/17/1985  Chief Complaint  Patient presents with  . Follow-up        Subjective:   Makayla Roberts is a 29 y.o. female here today for a follow up visit.she has a history of mental retardation, deafness, blindness in left eye, impaired Speech, behavioral issues and type 2 diabetes mellitus on metformin, today she is here for diabetes follow-up. She is present with her mother. She has no new complaints. Previous hemoglobin A1c was 6.2%. Patient is said to be compliant with her medications, reports no side effects. She follows up with neurology for her developmental issues and other neurological complications. Patient has No headache, No chest pain, No abdominal pain - No Nausea, No new weakness tingling or numbness, No Cough - SOB.  Problem  Type 2 Diabetes Mellitus Without Complication  Redness of Eye, Left    ALLERGIES: No Known Allergies  PAST MEDICAL HISTORY: Past Medical History  Diagnosis Date  . Blind left eye   . Mental retardation   . Diabetes mellitus without complication   . Deaf     MEDICATIONS AT HOME: Prior to Admission medications   Medication Sig Start Date End Date Taking? Authorizing Provider  fluticasone (FLONASE) 50 MCG/ACT nasal spray Place 2 sprays into both nostrils at bedtime. 03/15/14  Yes Dessa PhiJosalyn Funches, MD  Lancet Devices [facility](ACCU-CHEK SOFTCLIX) lancets Use as instructed 08/23/14  Yes Quentin Angstlugbemiga E Myiesha Edgar, MD  metFORMIN (GLUCOPHAGE) 500 MG tablet Take 1 tablet (500 mg total) by mouth 2 (two) times daily with a meal. 08/23/14  Yes Quentin Angstlugbemiga E Kamyla Olejnik, MD  risperiDONE (RISPERDAL) 1 MG tablet Take 1 tablet (1 mg total) by mouth 3 (three) times daily. 07/26/14  Yes Nilda RiggsNancy Carolyn Martin, NP  cetirizine (ZYRTEC) 10 MG tablet Take 1 tablet (10 mg total) by mouth daily. 03/15/14   Josalyn Funches, MD  glucose  blood (ACCU-CHEK AVIVA) test strip Use as instructed 08/23/14   Quentin Angstlugbemiga E Dawnyel Leven, MD  tetrahydrozoline (VISINE) 0.05 % ophthalmic solution Place 2 drops into the left eye 3 (three) times daily. 08/23/14   Quentin Angstlugbemiga E Romyn Boswell, MD     Objective:   Filed Vitals:   08/23/14 0910  BP: 123/87  Pulse: 113  Temp: 98.3 F (36.8 C)  TempSrc: Oral  Resp: 18  Height: 4\' 11"  (1.499 m)  Weight: 137 lb (62.143 kg)  SpO2: 96%    Exam General appearance : Awake, alert, not in any distress. Non-verbal. Not toxic looking HEENT: Atraumatic and Normocephalic, Neck: supple, no JVD. No cervical lymphadenopathy.  Chest: Good air entry bilaterally, no added sounds  CVS: S1 S2 regular, no murmurs.  Abdomen: Bowel sounds present, Non tender and not distended with no gaurding, rigidity or rebound. Extremities: B/L Lower Ext shows no edema, both legs are warm to touch Neurology: Awake alert,   Data Review Lab Results  Component Value Date   HGBA1C 6.20 08/23/2014   HGBA1C 6.20 04/19/2014   HGBA1C 6.7 06/27/2013     Assessment & Plan   1. Type 2 diabetes mellitus without complications  - Glucose (CBG) - HgB A1c - metFORMIN (GLUCOPHAGE) 500 MG tablet; Take 1 tablet (500 mg total) by mouth 2 (two) times daily with a meal.  Dispense: 180 tablet; Refill: 3 - Lancet Devices (ACCU-CHEK SOFTCLIX) lancets; Use as instructed  Dispense: 1 each; Refill:  0 - glucose blood (ACCU-CHEK AVIVA) test strip; Use as instructed  Dispense: 100 each; Refill: 12   Aim for 30 minutes of exercise most days. Rethink what you drink. Water is great! Aim for 2-3 Carb Choices per meal (30-45 grams) +/- 1 either way  Aim for 0-15 Carbs per snack if hungry  Include protein in moderation with your meals and snacks  Consider reading food labels for Total Carbohydrate and Fat Grams of foods  Consider checking BG at alternate times per day  Continue taking medication as directed Be mindful about how much sugar you are adding  to beverages and other foods. Try to decrease. Consider splenda. Fruit Punch - find one with no sugar  Measure and decrease portions of carbohydrate foods  Make your plate and don't go back for seconds   2. Mental retardation   3. Redness of eye, left  - tetrahydrozoline (VISINE) 0.05 % ophthalmic solution; Place 2 drops into the left eye 3 (three) times daily.  Dispense: 15 mL; Refill: 3   Patient have been counseled extensively about nutrition and exercise  Interpreter was used to communicate directly with patient for the entire encounter including providing detailed patient instructions.   Return in about 3 months (around 11/23/2014) for Hemoglobin A1C and Follow up, DM.  The patient was given clear instructions to go to ER or return to medical center if symptoms don't improve, worsen or new problems develop. The patient verbalized understanding. The patient was told to call to get lab results if they haven't heard anything in the next week.   This note has been created with Education officer, environmental. Any transcriptional errors are unintentional.    Jeanann Lewandowsky, MD, MHA, CPE, FACP, FAAP [facility] [place], [place] [phone]   08/23/2014, 10:02 AM

## 2014-09-14 ENCOUNTER — Encounter: Payer: Self-pay | Admitting: Family Medicine

## 2014-09-14 ENCOUNTER — Ambulatory Visit: Payer: Medicaid Other | Attending: Family Medicine | Admitting: Family Medicine

## 2014-09-14 VITALS — BP 115/85 | HR 137 | Temp 98.4°F | Resp 18 | Ht 59.0 in | Wt 136.0 lb

## 2014-09-14 DIAGNOSIS — E119 Type 2 diabetes mellitus without complications: Secondary | ICD-10-CM | POA: Diagnosis not present

## 2014-09-14 DIAGNOSIS — J069 Acute upper respiratory infection, unspecified: Secondary | ICD-10-CM

## 2014-09-14 DIAGNOSIS — R6250 Unspecified lack of expected normal physiological development in childhood: Secondary | ICD-10-CM | POA: Insufficient documentation

## 2014-09-14 DIAGNOSIS — I471 Supraventricular tachycardia: Secondary | ICD-10-CM | POA: Diagnosis not present

## 2014-09-14 DIAGNOSIS — R509 Fever, unspecified: Secondary | ICD-10-CM | POA: Diagnosis present

## 2014-09-14 DIAGNOSIS — H6121 Impacted cerumen, right ear: Secondary | ICD-10-CM

## 2014-09-14 DIAGNOSIS — R000 Tachycardia, unspecified: Secondary | ICD-10-CM

## 2014-09-14 LAB — GLUCOSE, POCT (MANUAL RESULT ENTRY): POC Glucose: 160 mg/dl — AB (ref 70–99)

## 2014-09-14 MED ORDER — CARBAMIDE PEROXIDE 6.5 % OT SOLN: 5.0000 [drp] | Freq: Two times a day (BID) | OTIC | Status: DC

## 2014-09-14 NOTE — Assessment & Plan Note (Signed)
Makayla Roberts has a cold that is due to a virus. There is no evidence of serious bacterial infection. Rapid strep negative Throat culture has been sent out   You have a viral URI  For this please do the following:  1. Drink plenty of fluids.  Hot tea, soup etc will help open your nasal passages. Cold popsicles to soothe her throat  2 Tylenol for pain up to 1000 mg three times daily.   Check temperature every morning if temperature is over 100.4 F, she has a fever.  Call for fever or trouble swallowing

## 2014-09-14 NOTE — Patient Instructions (Addendum)
Thank you for coming in today  Makayla Roberts has a cold that is due to a virus. There is no evidence of serious bacterial infection. Rapid strep negative Throat culture has been sent out   You have a viral URI  For this please do the following:  1. Drink plenty of fluids.  Hot tea, soup etc will help open your nasal passages. Cold popsicles to soothe her throat  2 Tylenol for pain up to 1000 mg three times daily.   Wax in R ear. Debrox drops for 5 days.   Check temperature every morning if temperature is over 100.4 F, she has a fever.  Call for fever or trouble swallowing  F/u in 4 weeks with Dr. Hyman Hopes for heart rate check, sinus tachycardia   Dr. Armen Pickup

## 2014-09-14 NOTE — Progress Notes (Signed)
Used language resource Arabic Interpreted Manhel Ahmed   Discharge from mouth yellow color Elevated temperature x 1 day

## 2014-09-14 NOTE — Assessment & Plan Note (Signed)
  Wax in R ear. Debrox drops for 5 days.

## 2014-09-14 NOTE — Progress Notes (Signed)
   Subjective:    Patient ID: Makayla Roberts, female    DOB: 1985-11-27, 29 y.o.   MRN: [id number] CC: yellow oral discharge, elevated temperature   HPI 29 yo F with developmental delay, history obtained from her mother and Arabic interpreter   1.  Fever: subjective fever started yesterday. Took dayquil this AM around 9 AM. Has decreased appetite and increase saliva production that is yellow and blood tinged yesterday. No sick contacts. No changes in stools.   History  Substance Use Topics  . Smoking status: Never Smoker   . Smokeless tobacco: Never Used  . Alcohol Use: No   Review of Systems  Constitutional: Negative for fever and chills.  Respiratory: Negative for shortness of breath.   Cardiovascular: Negative for chest pain.  Gastrointestinal: Negative for abdominal pain and blood in stool.  Skin: Negative for rash.  Psychiatric/Behavioral: Negative for suicidal ideas and dysphoric mood.      Objective:   Physical Exam BP 115/85 mmHg  Pulse 137  Temp(Src) 98.4 F (36.9 C) (Oral)  Resp 18  Ht  (1.499 m)  Wt 136 lb (61.689 kg)  BMI 27.45 kg/m2  SpO2 100%  General appearance: alert, cooperative and no distress Ears: normal TM and external ear canal left ear and abnormal external canal right ear - ceruminosis impacting canal Nose: no discharge, right turbinate normal, left turbinate swollen Throat: normal mouth. Exam of oropharynx due to patient non-compliance, parts of oropharynx that was visualized was pink w.o erythema or exudate.  Neck: no adenopathy, supple, symmetrical, trachea midline and thyroid not enlarged, symmetric, no tenderness/mass/nodules Lungs: clear to auscultation bilaterally Heart: SIS2, elevated HR  Abdomen: soft, non-tender; bowel sounds normal; no masses,  no organomegaly  CBG 160 Lab Results  Component Value Date   HGBA1C 6.20 08/23/2014   Rapid strep: negative Throat culture pending     Assessment & Plan:

## 2014-09-16 LAB — CULTURE, GROUP A STREP: Organism ID, Bacteria: NORMAL

## 2014-12-18 ENCOUNTER — Emergency Department (HOSPITAL_COMMUNITY): Payer: Medicaid Other

## 2014-12-18 ENCOUNTER — Encounter (HOSPITAL_COMMUNITY): Payer: Self-pay | Admitting: Cardiology

## 2014-12-18 ENCOUNTER — Emergency Department (HOSPITAL_COMMUNITY)
Admission: EM | Admit: 2014-12-18 | Discharge: 2014-12-18 | Disposition: A | Discharge status: Home / Self Care | Payer: Medicaid Other | Attending: Emergency Medicine | Admitting: Emergency Medicine

## 2014-12-18 DIAGNOSIS — R05 Cough: Secondary | ICD-10-CM | POA: Diagnosis not present

## 2014-12-18 DIAGNOSIS — R059 Cough, unspecified: Secondary | ICD-10-CM

## 2014-12-18 DIAGNOSIS — E119 Type 2 diabetes mellitus without complications: Secondary | ICD-10-CM | POA: Insufficient documentation

## 2014-12-18 DIAGNOSIS — R000 Tachycardia, unspecified: Secondary | ICD-10-CM | POA: Diagnosis not present

## 2014-12-18 HISTORY — DX: Unspecified lack of expected normal physiological development in childhood: R62.50

## 2014-12-18 LAB — CBC
HCT: 41.3 % (ref 36.0–46.0)
Hemoglobin: 14.7 g/dL (ref 12.0–15.0)
MCH: 30.9 pg (ref 26.0–34.0)
MCHC: 35.6 g/dL (ref 30.0–36.0)
MCV: 86.9 fL (ref 78.0–100.0)
Platelets: 261 10*3/uL (ref 150–400)
RBC: 4.75 MIL/uL (ref 3.87–5.11)
RDW: 12.2 % (ref 11.5–15.5)
WBC: 8.5 10*3/uL (ref 4.0–10.5)

## 2014-12-18 LAB — BASIC METABOLIC PANEL
Anion gap: 9 (ref 5–15)
BUN: 7 mg/dL (ref 6–20)
CO2: 26 mmol/L (ref 22–32)
Calcium: 9.5 mg/dL (ref 8.9–10.3)
Chloride: 100 mmol/L — ABNORMAL LOW (ref 101–111)
Creatinine, Ser: 0.62 mg/dL (ref 0.44–1.00)
GFR calc Af Amer: >60 mL/min (ref >60)
GFR calc non Af Amer: >60 mL/min (ref >60)
Glucose, Bld: 186 mg/dL — ABNORMAL HIGH (ref 65–99)
Potassium: 3.6 mmol/L (ref 3.5–5.1)
Sodium: 135 mmol/L (ref 135–145)

## 2014-12-18 LAB — URINALYSIS, ROUTINE W REFLEX MICROSCOPIC
Bilirubin Urine: NEGATIVE
Glucose, UA: NEGATIVE mg/dL
Hgb urine dipstick: NEGATIVE
Ketones, ur: NEGATIVE mg/dL
Leukocytes, UA: NEGATIVE
Nitrite: NEGATIVE
Protein, ur: NEGATIVE mg/dL
Specific Gravity, Urine: >1.046 — ABNORMAL HIGH (ref 1.005–1.030)
Urobilinogen, UA: 0.2 mg/dL (ref 0.0–1.0)
pH: 6 (ref 5.0–8.0)

## 2014-12-18 LAB — I-STAT TROPONIN, ED: Troponin i, poc: 0 ng/mL (ref 0.00–0.08)

## 2014-12-18 LAB — D-DIMER, QUANTITATIVE: D-Dimer, Quant: <0.27 ug/mL-FEU (ref 0.00–0.48)

## 2014-12-18 MED ORDER — SODIUM CHLORIDE 0.9 % IV BOLUS (SEPSIS)
1000.0000 mL | Freq: Once | INTRAVENOUS | Status: AC
  Administered 2014-12-18: 1000 mL via INTRAVENOUS

## 2014-12-18 MED ORDER — IOHEXOL 350 MG/ML SOLN
100.0000 mL | Freq: Once | INTRAVENOUS | Status: AC | PRN
Start: 2014-12-18 — End: 2014-12-18
  Administered 2014-12-18: 100 mL via INTRAVENOUS

## 2014-12-18 MED ORDER — SODIUM CHLORIDE 0.9 % IV BOLUS (SEPSIS): 1000.0000 mL | Freq: Once | INTRAVENOUS | Status: DC

## 2014-12-18 NOTE — ED Notes (Signed)
[facility] heart murmur. Family reports she has had a cough, blood in her sputum and weakness.

## 2014-12-18 NOTE — ED Notes (Signed)
Pt has been dressed walking around with sister and will not let staff get another BP

## 2014-12-18 NOTE — ED Notes (Signed)
Sister stepped out to the desk and states pt is trying to pull out the IV. This RN went into room. Wrapped the IV with kerlex and taped at multiple different areas around the arm on top of the kerlex. Pt continues to try to pull at the IV. Pt also pulling the EKG leads off of herself.

## 2014-12-18 NOTE — ED Notes (Signed)
This RN attempted to get another set of vitals on pt prior to discharge and pt began getting agitated. Pt was ready to leave with sisters.

## 2014-12-18 NOTE — ED Provider Notes (Signed)
CSN: [id number]     Arrival date & time 12/18/14  1426 History   First MD Initiated Contact with Patient 12/18/14 1630     Chief Complaint  Patient presents with  . Cough  . Tachycardia    Level V caveat: Cognitive impairment  (Consider location/radiation/quality/duration/timing/severity/associated sxs/prior Treatment) HPI Makayla Roberts is a 29 y.o. Arabic female with a history of diabetes who presents today for evaluation of cough and fast heart rate. 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 History of present illness is given via patient's caretaker in the room. Caretaker reports that over the past 3 weeks, patient has been coughing, will sometimes cough up blood. They went to an urgent care facility earlier today and were told that patient had a fast heart rate, elevated blood pressure as well as a heart murmur and recommended further evaluation and emergency department. Caretaker reports patient has been going to the bathroom/defecating more often today. Unsure of urinary frequency. Reports fevers intermittently over the past week, unmeasured, but treated with Tylenol and improved.  Past Medical History  Diagnosis Date  . Diabetes mellitus without complication (HCC)   . Developmental delay    History reviewed. No pertinent past surgical history. History reviewed. No pertinent family history. Social History  Substance Use Topics  . Smoking status: Never Smoker   . Smokeless tobacco: None  . Alcohol Use: No   OB History    No data available     Review of Systems A 10 point review of systems was completed and was negative except for pertinent positives and negatives as mentioned in the history of present illness     Allergies  Review of patient's allergies indicates no known allergies.  Home Medications   Prior to Admission medications   Not on File   BP 154/103 mmHg  Pulse 95  Temp(Src) 97.9 F  (36.6 C) (Oral)  Resp 26  SpO2 99%  LMP 12/17/2013 Physical Exam  Constitutional: She is oriented to person, place, and time. She appears well-developed and well-nourished.  HENT:  Head: Normocephalic and atraumatic.  Mouth/Throat: Oropharynx is clear and moist.  Eyes: Conjunctivae are normal. Pupils are equal, round, and reactive to light. Right eye exhibits no discharge. Left eye exhibits no discharge. No scleral icterus.  Left eye strabismus.  Neck: Normal range of motion. Neck supple.  Cardiovascular: Regular rhythm and normal heart sounds.   Tachycardic with normal heart sounds. No rubs, murmurs or gallops.  Pulmonary/Chest: Effort normal and breath sounds normal. No respiratory distress. She has no wheezes. She has no rales.  Abdominal: Soft. She exhibits no distension and no mass. There is no tenderness. There is no rebound and no guarding.  Musculoskeletal: She exhibits no tenderness.  Neurological: She is alert and oriented to person, place, and time.  Cranial Nerves II-XII grossly intact  Skin: Skin is warm and dry. No rash noted.  Psychiatric: She has a normal mood and affect.  Cognitive impairment. Patient does not speak. Exam somewhat limited by patient's disability.  Nursing note and vitals reviewed.   ED Course  Procedures (including critical care time) Labs Review Labs Reviewed  BASIC METABOLIC PANEL - Abnormal; Notable for the following:    Chloride 100 (*)    Glucose, Bld 186 (*)    All other components within normal limits  URINALYSIS, ROUTINE W REFLEX MICROSCOPIC (NOT AT [facility]) - Abnormal; Notable for the following:    APPearance CLOUDY (*)  Specific Gravity, Urine >1.046 (*)    All other components within normal limits  CBC  D-DIMER, QUANTITATIVE (NOT AT [facility])  Rosezena Sensor, ED    Imaging Review Dg Chest 2 View  12/18/2014  CLINICAL DATA:  Two-month history of cough and congestion EXAM: CHEST  2 VIEW COMPARISON:  None. FINDINGS: There is no edema  or consolidation. The heart size and pulmonary vascularity are normal. No adenopathy. No bone lesions. IMPRESSION: No edema or consolidation. Electronically Signed   By: Bretta Bang III M.D.   On: 12/18/2014 15:40   Ct Angio Chest Pe W/cm &/or Wo Cm  12/18/2014  CLINICAL DATA:  Hemoptysis beginning today. EXAM: CT ANGIOGRAPHY CHEST WITH CONTRAST TECHNIQUE: Multidetector CT imaging of the chest was performed using the standard protocol during bolus administration of intravenous contrast. Multiplanar CT image reconstructions and MIPs were obtained to evaluate the vascular anatomy. CONTRAST:  OMNIPAQUE IOHEXOL 350 MG/ML SOLN COMPARISON:  Chest x-ray 12/18/2014 FINDINGS: Mediastinum/Nodes: No breast masses, supraclavicular or axillary lymphadenopathy. Small scattered lymph nodes are noted. The heart is normal in size. No pericardial effusion. The aorta is normal in caliber. There is moderate tortuosity. Collateral vessels are noted with a prominent left superior intercostal vein. No mediastinal or hilar mass or adenopathy. The esophagus is grossly normal. Mild diffuse wall thickening could reflect changes of esophagitis. The right-sided tracheal diverticulum is noted incidentally. The pulmonary arterial tree is fairly well opacified. No filling defects to suggest pulmonary embolism. Mild enlargement of the main pulmonary artery may be due to pulmonary hypertension. Lungs/Pleura: Patchy ground-glass opacity in the lungs in a somewhat mosaic pattern. This is a nonspecific finding but can be seen with small airways disease such as constrictive bronchiolitis, reactive airways disease/ asthma, hypersensitivity pneumonitis or cryptogenic organizing pneumonia. No focal airspace consolidation to suggest typical pneumonia. No bronchiectasis or worrisome pulmonary lesions. No pleural effusion. Upper abdomen: No significant upper abdominal findings. Musculoskeletal: No significant osseous findings. Review of the MIP  images confirms the above findings. IMPRESSION: 1. No CT findings for pulmonary embolism. 2. Mild pectus deformity and slight compression of the heart. 3. Normal thoracic aorta other than moderate tortuosity. 4. Enlargement of pulmonary arteries suggesting pulmonary hypertension. 5. Mosaic pattern of ground-glass attenuation. Please see above discussion. No focal pneumonia. Electronically Signed   By: Rudie Meyer M.D.   On: 12/18/2014 18:57   I have personally reviewed and evaluated these images and lab results as part of my medical decision-making.   EKG Interpretation None     Meds given in ED:  Medications  sodium chloride 0.9 % bolus 1,000 mL (0 mLs Intravenous Stopped 12/18/14 1928)  iohexol (OMNIPAQUE) 350 MG/ML injection 100 mL (100 mLs Intravenous Contrast Given 12/18/14 1826)    There are no discharge medications for this patient.  Filed Vitals:   12/18/14 1815 12/18/14 1845 12/18/14 1914 12/18/14 1915  BP: 113/72 124/97  154/103  Pulse: 111 95    Temp:      TempSrc:      Resp: 34 18 26   SpO2: 98% 99%      MDM  Vitals stable  -afebrile Tachycardia improves after fluid bolus and when patient is calm. Suspect tachycardia somewhat related to agitation. Pt resting comfortably in ED. PE--physical exam as above and is grossly unremarkable. Original mild tachycardia is improving, no murmur heard. Benign lung exam. Physical exam otherwise unremarkable. Labwork-specific gravity on urinalysis is greater than 1.046. Labs otherwise noncontributory. Imaging-chest x-ray shows no edema or consolidation.  However, due to inability to obtain full history and original tachycardia with reported hemoptysis, will obtain CT angio chest. CT shows no pulmonary embolus. There is enlargement of pulmonary arteries suggesting possible pulmonary hypertension. No evidence of focal pneumonia.  DDX-suspect tachycardia also due to some degree of dehydration and is consistent with specific gravity on  urinalysis. Patient given referral to cardiology for further evaluation and management of possible pulmonary hypertension. Overall, patient appears well, nontoxic and is hemodynamically stable and appropriate for discharge. No evidence of other acute or emergent pathology at this time.  I discussed all relevant lab findings and imaging results with pt and they verbalized understanding. Discussed f/u with PCP within 48 hrs and return precautions, pt very amenable to plan. Prior to patient discharge, I discussed and reviewed this case with Dr. Juleen ChinaKohut, who agrees with above plan.  Final diagnoses:  Cough      Joycie PeekBenjamin Demarrius Guerrero, PA-C 12/19/14 0133  Raeford RazorStephen Kohut, MD 12/23/14 2129

## 2014-12-18 NOTE — ED Notes (Signed)
Request for mittens from materials to be placed on patient and awaiting their arrival to the unit at this time to keep pt safe and keep her from pulling out her IV and taking the monitor off.

## 2014-12-18 NOTE — ED Notes (Addendum)
Entered patient's room to apply mittens and patient was fully clothed walking around room. Family stated that patient removed everything [leads, IV, BP cuff, O2 sensor]. Mittens applied without difficulty, pt tolerated well.

## 2014-12-18 NOTE — ED Provider Notes (Signed)
Patient initially placed into fast track, but was deemed inappropriate and moved to pod A. Patient not seen by this provider.  Felicie Mornavid Fender Herder, NP 12/18/14 [id number]  Loren Raceravid Yelverton, MD 12/22/14 0120

## 2014-12-18 NOTE — ED Notes (Signed)
Hat placed in bathroom in room for sisters to help pt get a urine sample. They are attempting to get her to the bathroom at this time for the the specimen.

## 2014-12-18 NOTE — Discharge Instructions (Signed)
You were evaluated in the ED today and there does not appear to be an emergent cause for your symptoms. It is important if you to follow-up with cardiology, Dr. Sharyn LullHarwani within the next one or 2 days for reevaluation of your elevated long blood pressure. Return to ED for any worsening symptoms.  Cough, Adult Coughing is a reflex that clears your throat and your airways. Coughing helps to heal and protect your lungs. It is normal to cough occasionally, but a cough that happens with other symptoms or lasts a long time may be a sign of a condition that needs treatment. A cough may last only 2-3 weeks (acute), or it may last longer than 8 weeks (chronic). CAUSES Coughing is commonly caused by:  Breathing in substances that irritate your lungs.  A viral or bacterial respiratory infection.  Allergies.  Asthma.  Postnasal drip.  Smoking.  Acid backing up from the stomach into the esophagus (gastroesophageal reflux).  Certain medicines.  Chronic lung problems, including COPD (or rarely, lung cancer).  Other medical conditions such as heart failure. HOME CARE INSTRUCTIONS  Pay attention to any changes in your symptoms. Take these actions to help with your discomfort:  Take medicines only as told by your health care provider.  If you were prescribed an antibiotic medicine, take it as told by your health care provider. Do not stop taking the antibiotic even if you start to feel better.  Talk with your health care provider before you take a cough suppressant medicine.  Drink enough fluid to keep your urine clear or pale yellow.  If the air is dry, use a cold steam vaporizer or humidifier in your bedroom or your home to help loosen secretions.  Avoid anything that causes you to cough at work or at home.  If your cough is worse at night, try sleeping in a semi-upright position.  Avoid cigarette smoke. If you smoke, quit smoking. If you need help quitting, ask your health care  provider.  Avoid caffeine.  Avoid alcohol.  Rest as needed. SEEK MEDICAL CARE IF:   You have new symptoms.  You cough up pus.  Your cough does not get better after 2-3 weeks, or your cough gets worse.  You cannot control your cough with suppressant medicines and you are losing sleep.  You develop pain that is getting worse or pain that is not controlled with pain medicines.  You have a fever.  You have unexplained weight loss.  You have night sweats. SEEK IMMEDIATE MEDICAL CARE IF:  You cough up blood.  You have difficulty breathing.  Your heartbeat is very fast.   This information is not intended to replace advice given to you by your health care provider. Make sure you discuss any questions you have with your health care provider.   Document Released: 08/01/2010 Document Revised: 10/24/2014 Document Reviewed: 04/11/2014 Elsevier Interactive Patient Education [employer]2016 [employer]

## 2014-12-19 ENCOUNTER — Encounter: Payer: Self-pay | Admitting: Family Medicine

## 2014-12-27 ENCOUNTER — Encounter: Payer: Self-pay | Admitting: Internal Medicine

## 2014-12-27 ENCOUNTER — Ambulatory Visit: Payer: Medicaid Other | Attending: Internal Medicine | Admitting: Internal Medicine

## 2014-12-27 VITALS — BP 110/68 | HR 132 | Resp 18

## 2014-12-27 DIAGNOSIS — R6250 Unspecified lack of expected normal physiological development in childhood: Secondary | ICD-10-CM | POA: Diagnosis not present

## 2014-12-27 DIAGNOSIS — H9190 Unspecified hearing loss, unspecified ear: Secondary | ICD-10-CM | POA: Diagnosis not present

## 2014-12-27 DIAGNOSIS — I10 Essential (primary) hypertension: Secondary | ICD-10-CM | POA: Insufficient documentation

## 2014-12-27 DIAGNOSIS — R0989 Other specified symptoms and signs involving the circulatory and respiratory systems: Secondary | ICD-10-CM

## 2014-12-27 DIAGNOSIS — R030 Elevated blood-pressure reading, without diagnosis of hypertension: Secondary | ICD-10-CM | POA: Diagnosis not present

## 2014-12-27 DIAGNOSIS — Z79899 Other long term (current) drug therapy: Secondary | ICD-10-CM | POA: Insufficient documentation

## 2014-12-27 DIAGNOSIS — E119 Type 2 diabetes mellitus without complications: Secondary | ICD-10-CM | POA: Diagnosis present

## 2014-12-27 DIAGNOSIS — F79 Unspecified intellectual disabilities: Secondary | ICD-10-CM | POA: Diagnosis not present

## 2014-12-27 DIAGNOSIS — F89 Unspecified disorder of psychological development: Secondary | ICD-10-CM

## 2014-12-27 DIAGNOSIS — R000 Tachycardia, unspecified: Secondary | ICD-10-CM | POA: Diagnosis not present

## 2014-12-27 LAB — POCT GLYCOSYLATED HEMOGLOBIN (HGB A1C): Hemoglobin A1C: 5.9

## 2014-12-27 LAB — GLUCOSE, POCT (MANUAL RESULT ENTRY): POC Glucose: 146 mg/dl — AB (ref 70–99)

## 2014-12-27 MED ORDER — ACCU-CHEK SOFT TOUCH LANCETS MISC: Status: DC

## 2014-12-27 MED ORDER — GLUCOSE BLOOD VI STRP: ORAL_STRIP | Status: DC

## 2014-12-27 MED ORDER — METFORMIN HCL 500 MG PO TABS: 500.0000 mg | ORAL_TABLET | Freq: Two times a day (BID) | ORAL | Status: DC

## 2014-12-27 NOTE — Progress Notes (Signed)
Patient ID: Makayla Roberts, female   DOB: [birth year], 29 y.o.   MRN: [id number]   Makayla Roberts, is a 29 y.o. female  [id number]  [id number]  DOB - [birth year]  Chief Complaint  Patient presents with  . Follow-up        Subjective:   Makayla Roberts is a 29 y.o. female with history of mental retardation, deafness, blindness, impaired speech behavior issues and type 2 diabetes mellitus on metformin here today for a follow up diabetes. There is no significant complaints today. The patient is here with mom and interpreter patient follows up with neurologist, currently placed on Risperdal. Patient is compliant with medications. No hypoglycemic episodes. Blood pressure has been noted to be high and heart rate is high but no other symptoms. No syncopal attack. Patient has No headache, No chest pain, No abdominal pain - No Nausea, No new weakness tingling or numbness, No Cough - SOB.  Problem  Mental Retardation  Tachycardia With Hypertension    ALLERGIES: No Known Allergies  PAST MEDICAL HISTORY: Past Medical History  Diagnosis Date  . Blind left eye   . Mental retardation   . Deaf   . Diabetes mellitus without complication (HCC)   . Developmental delay     MEDICATIONS AT HOME: Prior to Admission medications   Medication Sig Start Date End Date Taking? Authorizing Provider  carbamide peroxide (DEBROX) 6.5 % otic solution Place 5 drops into the right ear 2 (two) times daily. For 5 days 09/14/14  Yes Dessa Phi, MD  cetirizine (ZYRTEC) 10 MG tablet Take 1 tablet (10 mg total) by mouth daily. 03/15/14  Yes Josalyn Funches, MD  fluticasone (FLONASE) 50 MCG/ACT nasal spray Place 2 sprays into both nostrils at bedtime. 03/15/14  Yes Josalyn Funches, MD  glucose blood (ACCU-CHEK AVIVA) test strip Use as instructed 12/27/14  Yes Quentin Angst, MD  Lancet Devices (ACCU-CHEK [facility]) lancets Use as instructed 08/23/14  Yes Quentin Angst, MD  metFORMIN (GLUCOPHAGE) 500 MG  tablet Take 1 tablet (500 mg total) by mouth 2 (two) times daily with a meal. 12/27/14  Yes Laquita Harlan E Hyman Hopes, MD  risperiDONE (RISPERDAL) 1 MG tablet Take 1 tablet (1 mg total) by mouth 3 (three) times daily. 07/26/14  Yes Nilda Riggs, NP  tetrahydrozoline (VISINE) 0.05 % ophthalmic solution Place 2 drops into the left eye 3 (three) times daily. 08/23/14  Yes Quentin Angst, MD  Lancets (ACCU-CHEK SOFT TOUCH) lancets Use as instructed 12/27/14   Quentin Angst, MD     Objective:   There were no vitals filed for this visit.  Exam General appearance : Awake, alert, not in any distress. Non-verbal, Not toxic looking HEENT: Atraumatic and Normocephalic, pupils equally reactive to light and accomodation Neck: supple, no JVD. No cervical lymphadenopathy.  Chest:Good air entry bilaterally, no added sounds  CVS: S1 S2 regular, tachycardia, ++murmurs.  Abdomen: Bowel sounds present, Non tender and not distended with no gaurding, rigidity or rebound. Extremities: B/L Lower Ext shows no edema, both legs are warm to touch Neurology: Awake alert   Data Review Lab Results  Component Value Date   HGBA1C 5.90 12/27/2014   HGBA1C 6.20 08/23/2014   HGBA1C 6.20 04/19/2014     Assessment & Plan   1. Type 2 diabetes mellitus without complication, without long-term current use of insulin (HCC)  - POCT A1C - Microalbumin/Creatinine Ratio, Urine - Glucose (CBG) - metFORMIN (GLUCOPHAGE) 500 MG tablet; Take 1 tablet (500 mg total) by mouth 2 (  two) times daily with a meal.  Dispense: 180 tablet; Refill: 3 - glucose blood (ACCU-CHEK AVIVA) test strip; Use as instructed  Dispense: 100 each; Refill: 12 - Lancets (ACCU-CHEK SOFT TOUCH) lancets; Use as instructed  Dispense: 100 each; Refill: 12  Aim for 30 minutes of exercise most days. Rethink what you drink. Water is great! Aim for 2-3 Carb Choices per meal (30-45 grams) +/- 1 either way  Aim for 0-15 Carbs per snack if hungry    Include protein in moderation with your meals and snacks  Consider reading food labels for Total Carbohydrate and Fat Grams of foods  Consider checking BG at alternate times per day  Continue taking medication as directed Be mindful about how much sugar you are adding to beverages and other foods. Fruit Punch - find one with no sugar  Measure and decrease portions of carbohydrate foods  Make your plate and don't go back for seconds   2. Tachycardia with hypertension  - Echocardiogram; Future  3. Developmental disability Follow-up with neurologist Patient have been counseled extensively about nutrition and exercise  Interpreter was used to communicate directly with patient for the entire encounter including providing detailed patient instructions.  Return in about 3 months (around 03/29/2015) for Hemoglobin A1C and Follow up, DM, Follow up HTN.  The patient was given clear instructions to go to ER or return to medical center if symptoms don't improve, worsen or new problems develop. The patient verbalized understanding. The patient was told to call to get lab results if they haven't heard anything in the next week.   This note has been created with Education officer, environmentalDragon speech recognition software and smart phrase technology. Any transcriptional errors are unintentional.    Jeanann LewandowskyJEGEDE, Cove Haydon, MD, MHA, FACP, FAAP, CPE [facility] [place], [place] [phone]   12/27/2014, 1:02 PM

## 2014-12-27 NOTE — Progress Notes (Signed)
Arna Mediciora [phone]

## 2014-12-28 ENCOUNTER — Telehealth: Payer: Self-pay | Admitting: *Deleted

## 2014-12-28 LAB — MICROALBUMIN / CREATININE URINE RATIO
Creatinine, Urine: 258 mg/dL (ref 20–320)
Microalb Creat Ratio: 17 mcg/mg creat (ref <30)
Microalb, Ur: 4.4 mg/dL

## 2014-12-28 NOTE — Telephone Encounter (Signed)
Medical Assistant used [employer] to contact patient.  Interpreter Name: Ronnell FreshwaterMarwan Interpreter [phone]  Patient verified DOB Medical Assistant spoke with patients mother. Mother was [facility] Rad[facility]. Appointment is scheduled at 1:00pm, mother was advised to arrive 15 minutes prior to appointment time. Mother had no further questions.

## 2015-01-01 ENCOUNTER — Ambulatory Visit (HOSPITAL_COMMUNITY)
Admission: RE | Admit: 2015-01-01 | Discharge: 2015-01-01 | Disposition: A | Discharge status: Home / Self Care | Payer: Medicaid Other | Source: Ambulatory Visit | Attending: Internal Medicine | Admitting: Internal Medicine

## 2015-01-01 DIAGNOSIS — R0989 Other specified symptoms and signs involving the circulatory and respiratory systems: Secondary | ICD-10-CM

## 2015-01-01 DIAGNOSIS — R000 Tachycardia, unspecified: Secondary | ICD-10-CM

## 2015-01-01 DIAGNOSIS — R030 Elevated blood-pressure reading, without diagnosis of hypertension: Secondary | ICD-10-CM

## 2015-01-01 DIAGNOSIS — R011 Cardiac murmur, unspecified: Secondary | ICD-10-CM | POA: Insufficient documentation

## 2015-01-01 DIAGNOSIS — I5189 Other ill-defined heart diseases: Secondary | ICD-10-CM | POA: Diagnosis not present

## 2015-01-01 DIAGNOSIS — I10 Essential (primary) hypertension: Secondary | ICD-10-CM

## 2015-01-01 DIAGNOSIS — E119 Type 2 diabetes mellitus without complications: Secondary | ICD-10-CM | POA: Insufficient documentation

## 2015-01-01 DIAGNOSIS — F79 Unspecified intellectual disabilities: Secondary | ICD-10-CM | POA: Diagnosis not present

## 2015-01-07 ENCOUNTER — Telehealth: Payer: Self-pay | Admitting: *Deleted

## 2015-01-07 NOTE — Telephone Encounter (Signed)
-----   Message from Quentin Angstlugbemiga E Jegede, MD sent at 01/02/2015  9:17 AM EST ----- Please inform patient that her heart ultrasound is normal.

## 2015-01-07 NOTE — Telephone Encounter (Signed)
Medical Assistant used [employer] to contact patient.  Interpreter Name: Chauncey CruelKhamal Interpreter #: [phone] Medical Assistant left message on patient's home and cell voicemail. Voicemail states to give a call back to [place] [facility] at [phone].

## 2015-01-28 ENCOUNTER — Ambulatory Visit (INDEPENDENT_AMBULATORY_CARE_PROVIDER_SITE_OTHER): Payer: Medicaid Other | Admitting: Nurse Practitioner

## 2015-01-28 ENCOUNTER — Encounter: Payer: Self-pay | Admitting: Nurse Practitioner

## 2015-01-28 VITALS — BP 114/89 | HR 109 | Ht 59.0 in | Wt 134.0 lb

## 2015-01-28 DIAGNOSIS — H5440 Blindness, one eye, unspecified eye: Secondary | ICD-10-CM

## 2015-01-28 DIAGNOSIS — H5442 Blindness, left eye, normal vision right eye: Secondary | ICD-10-CM | POA: Diagnosis not present

## 2015-01-28 DIAGNOSIS — F79 Unspecified intellectual disabilities: Secondary | ICD-10-CM

## 2015-01-28 DIAGNOSIS — F89 Unspecified disorder of psychological development: Secondary | ICD-10-CM | POA: Diagnosis not present

## 2015-01-28 MED ORDER — RISPERIDONE 1 MG PO TABS: 1.0000 mg | ORAL_TABLET | Freq: Three times a day (TID) | ORAL | Status: DC

## 2015-01-28 NOTE — Progress Notes (Signed)
[facility]  PATIENT: Makayla Roberts DOB: Oct 23, 1985   REASON FOR VISIT: Mental retardation, behavior changes, developmental disability HISTORY FROM: Mother and interpreter    HISTORY OF PRESENT ILLNESS:Makayla Roberts is a 29 year old female with a history of mental retardation, impaired speech and behavioral issues. The patient returns today for followup and interpreter is present. She is currently taken Risperdal and mom reports that it is working well for her.  Family state that she is doing better since starting adult day care.  She goes  3 days a week and stays there for 8 hours. She returns for reevaluation HISTORY 06/23/13 (CM): Makayla Roberts, 29 year old female returns today with her mother and an interpreter. Patient had been easily agitated and seen by Dr. Terrace Arabia 03/14/2013. This is much better on Risperdal. Patient was unable to have CT of the head due to the fact that she was uncooperative even with medication. Appetite has decreased as well since she has been on thyroid medication. She returns today for reevaluation. HISTORY 03/14/13 (YAN):: immigrated from [place] 3 months ago, accompanied by her mother, interpreter, and a Child psychotherapist at today's clinical visit  Mother had home delivery at [place] 29 years ago, it was a prolonged difficult delivery, she was blue and small when she was born, she was delayed in reaching her developmental milestones, began to walk at 29 years old, when she was 29 years old, physician at [place] told her mother that part of her brain was missing  she never went to school, developmentally delayed, was not able to speak, tends to have behavior issues  Since she moved to [place] 3 months ago, living in apartment with her mother, she no longer have other relatives to help taking care of her,  Mother felt overwhelming, patient has gained a lot of weight also become agitated easily, she eats everything that she can get her hands on,  Sometimes she is out  of control, began developing hair out of her head, also be violent towards her mother, she has difficulty sleeping,  There was no history of seizure, mother has 3 other children, that is all healthy    REVIEW OF SYSTEMS: Full 14 system review of systems performed and notable only for those listed, all others are neg:  Constitutional: neg  Cardiovascular: neg Ear/Nose/Throat: neg  Skin: neg Eyes: Blind in the left eye Respiratory: neg Gastroitestinal: neg  Hematology/Lymphatic: neg  Endocrine: neg Musculoskeletal:neg Allergy/Immunology: neg Neurological: neg Psychiatric: neg Sleep : neg   ALLERGIES: No Known Allergies  HOME MEDICATIONS: Outpatient Prescriptions Prior to Visit  Medication Sig Dispense Refill  . carbamide peroxide (DEBROX) 6.5 % otic solution Place 5 drops into the right ear 2 (two) times daily. For 5 days 15 mL 0  . cetirizine (ZYRTEC) 10 MG tablet Take 1 tablet (10 mg total) by mouth daily. 90 tablet 1  . fluticasone (FLONASE) 50 MCG/ACT nasal spray Place 2 sprays into both nostrils at bedtime. 16 g 6  . glucose blood (ACCU-CHEK AVIVA) test strip Use as instructed 100 each 12  . Lancet Devices (ACCU-CHEK SOFTCLIX) lancets Use as instructed 1 each 0  . Lancets (ACCU-CHEK SOFT TOUCH) lancets Use as instructed 100 each 12  . metFORMIN (GLUCOPHAGE) 500 MG tablet Take 1 tablet (500 mg total) by mouth 2 (two) times daily with a meal. 180 tablet 3  . risperiDONE (RISPERDAL) 1 MG tablet Take 1 tablet (1 mg total) by mouth 3 (three) times daily. 90 tablet 8  . tetrahydrozoline (  VISINE) 0.05 % ophthalmic solution Place 2 drops into the left eye 3 (three) times daily. 15 mL 3   No facility-administered medications prior to visit.    PAST MEDICAL HISTORY: Past Medical History  Diagnosis Date  . Blind left eye   . Mental retardation   . Deaf   . Diabetes mellitus without complication (HCC)   . Developmental delay     PAST SURGICAL HISTORY: Past Surgical History   Procedure Laterality Date  . None      FAMILY HISTORY: Family History  Problem Relation Age of Onset  . Hypertension Mother   . Arthritis Mother   . Heart disease Mother     SOCIAL HISTORY: Social History   Social History  . Marital Status: Single    Spouse Name: N/A  . Number of Children: N/A  . Years of Education: N/A   Occupational History  . Not on file.   Social History Main Topics  . Smoking status: Never Smoker   . Smokeless tobacco: Not on file  . Alcohol Use: No  . Drug Use: No  . Sexual Activity: No   Other Topics Concern  . Not on file   Social History Narrative   ** Merged History Encounter **       Patient lives at home with her mom (Zenind). Education- None Left handed. Caffeine- Some hot tea  Patient Special Coordinator Evon Slack- Margaret Evans - [phone], [phone] and fax [phone].     PHYSICAL EXAM  Filed Vitals:   01/28/15 1259  BP: 114/89  Pulse: 109  Height: 4\' 11"  (1.499 m)  Weight: 134 lb (60.782 kg)   Body mass index is 27.05 kg/(m^2). Generalized: Well developed, in no acute distress   Neurological examination  Mentation: Nonverbal, unable to follow directions.  Cranial nerve II-XII: Left cornea opaque, right pupil irregular but reactive to light. . Extraocular movements were full. Facial symmetry. Motor: The motor testing reveals 5 over 5 strength of all 4 extremities. Good symmetric motor tone is noted throughout.  Sensory: could not assess properly withdraws to pain Coordination: could not assess Gait and station: ambulates independently and without difficulty.  Reflexes: Deep tendon reflexes are symmetric and normal bilaterally.    DIAGNOSTIC DATA (LABS, IMAGING, TESTING) - I reviewed patient records, labs, notes, testing and imaging myself where available.  Lab Results  Component Value Date   WBC 8.5 12/18/2014   HGB 14.7 12/18/2014   HCT 41.3 12/18/2014   MCV 86.9 12/18/2014   PLT 261 12/18/2014        Component Value Date/Time   NA 135 12/18/2014 1533   K 3.6 12/18/2014 1533   CL 100* 12/18/2014 1533   CO2 26 12/18/2014 1533   GLUCOSE 186* 12/18/2014 1533   BUN 7 12/18/2014 1533   CREATININE 0.62 12/18/2014 1533   CREATININE 0.59 01/11/2013 1536   CALCIUM 9.5 12/18/2014 1533   PROT 7.3 03/02/2014 1530   ALBUMIN 4.0 03/02/2014 1530   AST 21 03/02/2014 1530   ALT 18 03/02/2014 1530   ALKPHOS 60 03/02/2014 1530   BILITOT 0.4 03/02/2014 1530   GFRNONAA >60 12/18/2014 1533   GFRNONAA >89 01/11/2013 1536   GFRAA >60 12/18/2014 1533   GFRAA >89 01/11/2013 1536    Lab Results  Component Value Date   HGBA1C 5.90 12/27/2014       ASSESSMENT AND PLAN 29 y.o. year old female has a past medical history of Blind left eye; Mental retardation; Diabetes  mellitus without complication; and Deaf. here to follow-up for 1. Mental retardation with developmental delays 2. Behavior issues  Continue Risperdal at current dose dose not need refill at this time F/U yearly and prn Nilda Riggs, [facility], [facility], APRN  [facility] [address], [address] [place], [place] [address] [phone]

## 2015-01-28 NOTE — Patient Instructions (Signed)
Continue Risperdal at current dose dose not need refill at this time F/U yearly and prn

## 2015-01-28 NOTE — Progress Notes (Signed)
I have reviewed and agreed above plan. 

## 2015-01-29 NOTE — Progress Notes (Signed)
I have reviewed and agreed above plan. 

## 2015-02-20 MED FILL — risperiDONE 1 MG TABS: 1 | 30 days supply | Qty: 90 | Fill #7

## 2015-02-20 MED FILL — metFORMIN HCL 500 MG TABS: 500 | 30 days supply | Qty: 60 | Fill #6

## 2015-02-20 MED FILL — ACCU-CHEK AVIVA PLUS TEST S: 25 days supply | Qty: 100 | Fill #6

## 2015-03-27 MED FILL — metFORMIN HCL 500 MG TABS: 500 | 30 days supply | Qty: 60 | Fill #7

## 2015-03-27 MED FILL — risperiDONE 1 MG TABS: 1 | 30 days supply | Qty: 90 | Fill #8

## 2015-03-27 MED FILL — ACCU-CHEK AVIVA PLUS TEST S: 25 days supply | Qty: 100 | Fill #7

## 2015-04-19 ENCOUNTER — Other Ambulatory Visit: Payer: Self-pay | Admitting: Nurse Practitioner

## 2015-04-19 MED FILL — ACCU-CHEK AVIVA PLUS TEST S: 25 days supply | Qty: 100 | Fill #8

## 2015-04-19 MED FILL — metFORMIN HCL 500 MG TABS: 500 | 30 days supply | Qty: 60 | Fill #8

## 2015-04-19 MED FILL — risperiDONE 1 MG TABS: 1 | 30 days supply | Qty: 90 | Fill #0

## 2015-05-21 MED FILL — ACCU-CHEK AVIVA PLUS TEST S: 25 days supply | Qty: 100 | Fill #9

## 2015-05-21 MED FILL — risperiDONE 1 MG TABS: 1 | 30 days supply | Qty: 90 | Fill #1

## 2015-05-22 MED FILL — metFORMIN HCL 500 MG TABS: 500 | 30 days supply | Qty: 60 | Fill #9

## 2015-06-20 ENCOUNTER — Ambulatory Visit: Payer: Medicaid Other | Attending: Internal Medicine | Admitting: Internal Medicine

## 2015-06-20 DIAGNOSIS — Z0289 Encounter for other administrative examinations: Secondary | ICD-10-CM | POA: Diagnosis not present

## 2015-06-20 DIAGNOSIS — F79 Unspecified intellectual disabilities: Secondary | ICD-10-CM | POA: Insufficient documentation

## 2015-06-20 DIAGNOSIS — E119 Type 2 diabetes mellitus without complications: Secondary | ICD-10-CM | POA: Diagnosis not present

## 2015-06-20 DIAGNOSIS — E1165 Type 2 diabetes mellitus with hyperglycemia: Secondary | ICD-10-CM | POA: Insufficient documentation

## 2015-06-20 DIAGNOSIS — Z7984 Long term (current) use of oral hypoglycemic drugs: Secondary | ICD-10-CM | POA: Insufficient documentation

## 2015-06-20 DIAGNOSIS — H9190 Unspecified hearing loss, unspecified ear: Secondary | ICD-10-CM | POA: Diagnosis not present

## 2015-06-20 DIAGNOSIS — F89 Unspecified disorder of psychological development: Secondary | ICD-10-CM | POA: Diagnosis not present

## 2015-06-20 DIAGNOSIS — Z79899 Other long term (current) drug therapy: Secondary | ICD-10-CM | POA: Diagnosis not present

## 2015-06-20 DIAGNOSIS — Z020 Encounter for examination for admission to educational institution: Secondary | ICD-10-CM

## 2015-06-20 MED ORDER — RISPERIDONE 1 MG PO TABS: 1.0000 mg | ORAL_TABLET | Freq: Three times a day (TID) | ORAL | Status: DC

## 2015-06-20 MED ORDER — METFORMIN HCL 500 MG PO TABS: 500.0000 mg | ORAL_TABLET | Freq: Two times a day (BID) | ORAL | Status: DC

## 2015-06-20 NOTE — Progress Notes (Signed)
Patient ID: Makayla AllegraRawaa Granda, female   DOB: 1985/08/09, 30 y.o.   MRN: [id number]   Makayla AllegraRawaa Mcisaac, is a 30 y.o. female  [id number]  [id number]  DOB - 1985/08/09  No chief complaint on file.       Subjective:   Makayla Roberts is a 30 y.o. female with history of mental retardation, deafness, blindness, impaired speech, behavior issues and type 2 diabetes mellitus on metformin here today for a follow up diabetes and school physical for [facility]. There is no significant complaint today. 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 Patient is non-verbal.She follows up with neurologist for developmental delay.  Problem  School Physical Exam  Type 2 Diabetes Mellitus Without Complication, Without Long-Term Current Use of Insulin (Hcc)    ALLERGIES: No Known Allergies  PAST MEDICAL HISTORY: Past Medical History  Diagnosis Date  . Blind left eye   . Mental retardation   . Deaf   . Diabetes mellitus without complication (HCC)   . Developmental delay     MEDICATIONS AT HOME: Prior to Admission medications   Medication Sig Start Date End Date Taking? Authorizing Provider  carbamide peroxide (DEBROX) 6.5 % otic solution Place 5 drops into the right ear 2 (two) times daily. For 5 days Patient not taking: Reported on 01/28/2015 09/14/14   Dessa PhiJosalyn Funches, MD  cetirizine (ZYRTEC) 10 MG tablet Take 1 tablet (10 mg total) by mouth daily. Patient not taking: Reported on 01/28/2015 03/15/14   Dessa PhiJosalyn Funches, MD  fluticasone (FLONASE) 50 MCG/ACT nasal spray Place 2 sprays into both nostrils at bedtime. Patient not taking: Reported on 01/28/2015 03/15/14   Dessa PhiJosalyn Funches, MD  glucose blood (ACCU-CHEK AVIVA) test strip Use as instructed Patient not taking: Reported on 01/28/2015 12/27/14   Quentin Angstlugbemiga E Bernon Arviso, MD  Lancet Devices [facility](ACCU-CHEK SOFTCLIX) lancets Use as instructed Patient not taking: Reported on 01/28/2015 08/23/14    Quentin Angstlugbemiga E Deloris Moger, MD  Lancets (ACCU-CHEK SOFT TOUCH) lancets Use as instructed Patient not taking: Reported on 01/28/2015 12/27/14   Quentin Angstlugbemiga E Caliya Narine, MD  metFORMIN (GLUCOPHAGE) 500 MG tablet Take 1 tablet (500 mg total) by mouth 2 (two) times daily with a meal. 06/20/15   Quentin Angstlugbemiga E Ahmir Bracken, MD  risperiDONE (RISPERDAL) 1 MG tablet Take 1 tablet (1 mg total) by mouth 3 (three) times daily. 06/20/15   Quentin Angstlugbemiga E Whitley Patchen, MD  tetrahydrozoline (VISINE) 0.05 % ophthalmic solution Place 2 drops into the left eye 3 (three) times daily. Patient not taking: Reported on 01/28/2015 08/23/14   Quentin Angstlugbemiga E Yanet Balliet, MD     Objective:   There were no vitals filed for this visit.  Exam General appearance : Awake, alert, not in any distress. Non-verbal. Not toxic looking, short stature, pacing,  HEENT: Left cornea opaque, right pupil irregular but reactive to light. Atraumatic and Normocephalic, pupils equally reactive to light and accomodation Neck: supple, no JVD. No cervical lymphadenopathy.  Chest:Good air entry bilaterally, no added sounds  CVS: S1 S2 regular, no murmurs.  Abdomen: Bowel sounds present, Non tender and not distended with no gaurding, rigidity or rebound. Extremities: B/L Lower Ext shows no edema, both legs are warm to touch Neurology: Awake alert, and oriented X 3, CN II-XII intact, Non focal Skin: No Rash  Data Review Lab Results  Component Value Date   HGBA1C 5.90 12/27/2014   HGBA1C 6.20 08/23/2014   HGBA1C 6.20 04/19/2014     Assessment & Plan   1.  School physical exam: Conducted  Forms filled for adult day care Patient's mother claimed she has had PPD done before, POSITIVE and was treated for 6 months. She claimed she has documents from public health department.  2. Developmental disability Refill - risperiDONE (RISPERDAL) 1 MG tablet; Take 1 tablet (1 mg total) by mouth 3 (three) times daily.  Dispense: 90 tablet; Refill: 8  3. Type 2 diabetes mellitus  without complication, without long-term current use of insulin (HCC) Refill - metFORMIN (GLUCOPHAGE) 500 MG tablet; Take 1 tablet (500 mg total) by mouth 2 (two) times daily with a meal.  Dispense: 180 tablet; Refill: 3  Patient have been counseled extensively about nutrition and exercise  Return in about 6 months (around 12/21/2015) for Hemoglobin A1C and Follow up, DM.  The patient was given clear instructions to go to ER or return to medical center if symptoms don't improve, worsen or new problems develop. The patient verbalized understanding. The patient was told to call to get lab results if they haven't heard anything in the next week.   This note has been created with Education officer, environmental. Any transcriptional errors are unintentional.    Jeanann Lewandowsky, MD, MHA, FACP, FAAP, CPE [facility] [place], [place] [phone]   06/20/2015, 4:29 PM

## 2015-06-20 NOTE — Progress Notes (Signed)
Patient is here for Physical   Patient denies pain at this time.

## 2015-06-26 MED FILL — metFORMIN HCL 500 MG TABS: 500 | 30 days supply | Qty: 60 | Fill #0

## 2015-06-26 MED FILL — ACCU-CHEK AVIVA PLUS TEST S: 25 days supply | Qty: 100 | Fill #10

## 2015-06-26 MED FILL — risperiDONE 1 MG TABS: 1 | 30 days supply | Qty: 90 | Fill #0

## 2015-07-24 MED FILL — metFORMIN HCL 500 MG TABS: 500 | 30 days supply | Qty: 60 | Fill #1

## 2015-07-24 MED FILL — ACCU-CHEK AVIVA PLUS TEST S: 25 days supply | Qty: 100 | Fill #11

## 2015-07-24 MED FILL — risperiDONE 1 MG TABS: 1 | 30 days supply | Qty: 90 | Fill #1

## 2015-08-26 ENCOUNTER — Other Ambulatory Visit: Payer: Self-pay | Admitting: Internal Medicine

## 2015-08-26 MED FILL — risperiDONE 1 MG TABS: 1 | 30 days supply | Qty: 90 | Fill #2

## 2015-08-26 MED FILL — metFORMIN HCL 500 MG TABS: 500 | 30 days supply | Qty: 60 | Fill #2

## 2015-08-26 MED FILL — ACCU-CHEK AVIVA PLUS TEST S: 30 days supply | Qty: 100 | Fill #0

## 2015-09-23 MED FILL — ACCU-CHEK AVIVA PLUS TEST S: 30 days supply | Qty: 100 | Fill #1

## 2015-09-23 MED FILL — risperiDONE 1 MG TABS: 1 | 30 days supply | Qty: 90 | Fill #3

## 2015-09-23 MED FILL — metFORMIN HCL 500 MG TABS: 500 | 30 days supply | Qty: 60 | Fill #3

## 2015-10-30 MED FILL — risperiDONE 1 MG TABS: 1 | 30 days supply | Qty: 90 | Fill #4

## 2015-10-30 MED FILL — ACCU-CHEK AVIVA PLUS TEST S: 30 days supply | Qty: 100 | Fill #2

## 2015-10-30 MED FILL — metFORMIN HCL 500 MG TABS: 500 | 30 days supply | Qty: 60 | Fill #4

## 2015-11-22 MED FILL — risperiDONE 1 MG TABS: 1 | 30 days supply | Qty: 90 | Fill #5

## 2015-11-22 MED FILL — metFORMIN HCL 500 MG TABS: 500 | 30 days supply | Qty: 60 | Fill #5

## 2015-11-22 MED FILL — ACCU-CHEK AVIVA PLUS TEST S: 30 days supply | Qty: 100 | Fill #3

## 2015-12-24 MED FILL — ACCU-CHEK AVIVA PLUS TEST S: 30 days supply | Qty: 100 | Fill #4

## 2015-12-24 MED FILL — metFORMIN HCL 500 MG TABS: 500 | 30 days supply | Qty: 60 | Fill #6

## 2015-12-24 MED FILL — risperiDONE 1 MG TABS: 1 | 30 days supply | Qty: 90 | Fill #6

## 2016-01-23 ENCOUNTER — Encounter: Payer: Self-pay | Admitting: Internal Medicine

## 2016-01-23 ENCOUNTER — Ambulatory Visit: Payer: Medicaid Other | Attending: Internal Medicine | Admitting: Internal Medicine

## 2016-01-23 VITALS — BP 133/97 | HR 74 | Temp 98.8°F | Resp 18 | Ht <= 58 in | Wt 135.6 lb

## 2016-01-23 DIAGNOSIS — H578 Other specified disorders of eye and adnexa: Secondary | ICD-10-CM | POA: Diagnosis not present

## 2016-01-23 DIAGNOSIS — R51 Headache: Secondary | ICD-10-CM | POA: Insufficient documentation

## 2016-01-23 DIAGNOSIS — Z79899 Other long term (current) drug therapy: Secondary | ICD-10-CM | POA: Diagnosis not present

## 2016-01-23 DIAGNOSIS — Z7984 Long term (current) use of oral hypoglycemic drugs: Secondary | ICD-10-CM | POA: Diagnosis not present

## 2016-01-23 DIAGNOSIS — E119 Type 2 diabetes mellitus without complications: Secondary | ICD-10-CM | POA: Diagnosis not present

## 2016-01-23 DIAGNOSIS — H5789 Other specified disorders of eye and adnexa: Secondary | ICD-10-CM

## 2016-01-23 DIAGNOSIS — F79 Unspecified intellectual disabilities: Secondary | ICD-10-CM | POA: Diagnosis not present

## 2016-01-23 LAB — CBC WITH DIFFERENTIAL/PLATELET
Basophils Absolute: 0 cells/uL (ref 0–200)
Basophils Relative: 0 %
Eosinophils Absolute: 0 cells/uL — ABNORMAL LOW (ref 15–500)
Eosinophils Relative: 0 %
HCT: 41.7 % (ref 35.0–45.0)
Hemoglobin: 14.3 g/dL (ref 11.7–15.5)
Lymphocytes Relative: 42 %
Lymphs Abs: 3066 cells/uL (ref 850–3900)
MCH: 30.7 pg (ref 27.0–33.0)
MCHC: 34.3 g/dL (ref 32.0–36.0)
MCV: 89.5 fL (ref 80.0–100.0)
MPV: 9.5 fL (ref 7.5–12.5)
Monocytes Absolute: 365 cells/uL (ref 200–950)
Monocytes Relative: 5 %
Neutro Abs: 3869 cells/uL (ref 1500–7800)
Neutrophils Relative %: 53 %
Platelets: 264 10*3/uL (ref 140–400)
RBC: 4.66 MIL/uL (ref 3.80–5.10)
RDW: 13.4 % (ref 11.0–15.0)
WBC: 7.3 10*3/uL (ref 3.8–10.8)

## 2016-01-23 LAB — COMPLETE METABOLIC PANEL WITH GFR
ALT: 12 U/L (ref 6–29)
AST: 12 U/L (ref 10–30)
Albumin: 4.3 g/dL (ref 3.6–5.1)
Alkaline Phosphatase: 58 U/L (ref 33–115)
BUN: 12 mg/dL (ref 7–25)
CO2: 26 mmol/L (ref 20–31)
Calcium: 9.9 mg/dL (ref 8.6–10.2)
Chloride: 104 mmol/L (ref 98–110)
Creat: 0.83 mg/dL (ref 0.50–1.10)
GFR, Est African American: >89 mL/min (ref >=60)
GFR, Est Non African American: >89 mL/min (ref >=60)
Glucose, Bld: 180 mg/dL — ABNORMAL HIGH (ref 65–99)
Potassium: 4.2 mmol/L (ref 3.5–5.3)
Sodium: 141 mmol/L (ref 135–146)
Total Bilirubin: 0.3 mg/dL (ref 0.2–1.2)
Total Protein: 7.3 g/dL (ref 6.1–8.1)

## 2016-01-23 LAB — GLUCOSE, POCT (MANUAL RESULT ENTRY): POC Glucose: 179 mg/dl — AB (ref 70–99)

## 2016-01-23 LAB — POCT GLYCOSYLATED HEMOGLOBIN (HGB A1C): Hemoglobin A1C: 6.3

## 2016-01-23 MED ORDER — OFLOXACIN 0.3 % OP SOLN: 2.0000 [drp] | Freq: Four times a day (QID) | OPHTHALMIC | 0 refills | Status: DC

## 2016-01-23 MED FILL — OFLOXACIN 0.3% EYE DROPS: 0.3 | 10 days supply | Qty: 5 | Fill #0

## 2016-01-23 MED FILL — risperiDONE 1 MG TABS: 1 | 30 days supply | Qty: 90 | Fill #7

## 2016-01-23 MED FILL — ACCU-CHEK AVIVA PLUS TEST S: 30 days supply | Qty: 100 | Fill #5

## 2016-01-23 MED FILL — metFORMIN HCL 500 MG TABS: 500 | 30 days supply | Qty: 60 | Fill #7

## 2016-01-23 NOTE — Progress Notes (Signed)
Makayla Roberts, is a 30 y.o. female  ZOX:[id number]:[id number]  [id number]:[id number]  DOB - 1985/11/01  Chief Complaint  Patient presents with  . Diabetes       Subjective:   Makayla Roberts is a 30 y.o. female with history of mental retardation, deafness, blindness, impaired speech, behavior issues and type 2 diabetes mellitus on metformin here today for a follow up visit for DM. The patient is here with mom, patient follows up with neurologist for mental retardation and other neurological issues. She has chronic headaches. Patient is compliant with medications. No hypoglycemic episodes. Blood pressure has been noted to be high and heart rate is high but no other symptoms. No syncopal attack. Patient has No chest pain, No abdominal pain - No Nausea, No new weakness tingling or numbness, No Cough - SOB.  No problems updated.  ALLERGIES: No Known Allergies  PAST MEDICAL HISTORY: Past Medical History:  Diagnosis Date  . Blind left eye   . Deaf   . Developmental delay   . Diabetes mellitus without complication (HCC)   . Mental retardation     MEDICATIONS AT HOME: Prior to Admission medications   Medication Sig Start Date End Date Taking? Authorizing Provider  ACCU-CHEK AVIVA PLUS test strip USE AS INSTRUCTED 08/26/15  Yes Quentin Angstlugbemiga E Kashana Breach, MD  cetirizine (ZYRTEC) 10 MG tablet Take 1 tablet (10 mg total) by mouth daily. 03/15/14  Yes Josalyn Funches, MD  glucose blood (ACCU-CHEK AVIVA) test strip Use as instructed 12/27/14  Yes Quentin Angstlugbemiga E Brianni Manthe, MD  Lancet Devices [facility](ACCU-CHEK SOFTCLIX) lancets Use as instructed 08/23/14  Yes Quentin Angstlugbemiga E Sophiamarie Nease, MD  Lancets (ACCU-CHEK SOFT TOUCH) lancets Use as instructed 12/27/14  Yes Quentin Angstlugbemiga E Jasai Sorg, MD  metFORMIN (GLUCOPHAGE) 500 MG tablet Take 1 tablet (500 mg total) by mouth 2 (two) times daily with a meal. 06/20/15  Yes Quentin Angstlugbemiga E Guliana Weyandt, MD  risperiDONE (RISPERDAL) 1 MG tablet Take 1 tablet (1 mg total) by mouth 3 (three) times daily. 06/20/15  Yes  Quentin Angstlugbemiga E Mylia Pondexter, MD  tetrahydrozoline (VISINE) 0.05 % ophthalmic solution Place 2 drops into the left eye 3 (three) times daily. 08/23/14  Yes Quentin Angstlugbemiga E Terrill Alperin, MD  carbamide peroxide (DEBROX) 6.5 % otic solution Place 5 drops into the right ear 2 (two) times daily. For 5 days Patient not taking: Reported on 01/23/2016 09/14/14   Dessa PhiJosalyn Funches, MD  fluticasone (FLONASE) 50 MCG/ACT nasal spray Place 2 sprays into both nostrils at bedtime. Patient not taking: Reported on 01/28/2015 03/15/14   Dessa PhiJosalyn Funches, MD  ofloxacin (OCUFLOX) 0.3 % ophthalmic solution Place 2 drops into the left eye 4 (four) times daily. 01/23/16   Quentin Angstlugbemiga E Arlina Sabina, MD    Objective:   Vitals:   01/23/16 1623  BP: (!) 133/97  Pulse: 74  Resp: 18  Temp: 98.8 F (37.1 C)  TempSrc: Oral  SpO2: 98%  Weight: 135 lb 9.6 oz (61.5 kg)  Height: 4\' 8"  (1.422 m)   Exam General appearance : Awake, alert, not in any distress. Non-verbal. Not toxic looking, short stature HEENT: Opaque left cornea, irregular pupil, Atraumatic and Normocephalic Neck: Supple, no JVD. No cervical lymphadenopathy.  Chest: Good air entry bilaterally, no added sounds  CVS: S1 S2 regular, no murmurs.  Abdomen: Bowel sounds present, Non tender and not distended with no gaurding, rigidity or rebound. Extremities: B/L Lower Ext shows no edema, both legs are warm to touch Neurology: Awake alert, and oriented X 3, CN II-XII intact, Non focal Skin: No Rash  Data Review Lab Results  Component Value Date   HGBA1C 5.90 12/27/2014   HGBA1C 6.20 08/23/2014   HGBA1C 6.20 04/19/2014    Assessment & Plan   1. Type 2 diabetes mellitus without complication, without long-term current use of insulin (HCC)  - POCT A1C - Glucose (CBG) - Microalbumin/Creatinine Ratio, Urine - CBC with Differential/Platelet - COMPLETE METABOLIC PANEL WITH GFR - TSH  2. Redness of eye, left  - ofloxacin (OCUFLOX) 0.3 % ophthalmic solution; Place 2 drops into the  left eye 4 (four) times daily.  Dispense: 5 mL; Refill: 0  Patient have been counseled extensively about nutrition and exercise. Other issues discussed during this visit include: low cholesterol diet, weight control and daily exercise, foot care, annual eye examinations at Ophthalmology, importance of adherence with medications and regular follow-up. We also discussed long term complications of uncontrolled diabetes and hypertension.   Return in about 6 months (around 07/23/2016) for Hemoglobin A1C and Follow up, DM.   Interpreter was used to communicate directly with patient for the entire encounter including providing detailed patient instructions.   The patient was given clear instructions to go to ER or return to medical center if symptoms don't improve, worsen or new problems develop. The patient verbalized understanding. The patient was told to call to get lab results if they haven't heard anything in the next week.   This note has been created with Education officer, environmentalDragon speech recognition software and smart phrase technology. Any transcriptional errors are unintentional.    Jeanann LewandowskyJEGEDE, Ericberto Padget, MD, MHA, Maxwell CaulFACP, FAAP, CPE [facility] [place], [place] [phone]   01/23/2016, 4:37 PM

## 2016-01-23 NOTE — Patient Instructions (Signed)
Allergic Conjunctivitis A clear membrane (conjunctiva) covers the white part of your eye and the inner surface of your eyelid. Allergic conjunctivitis happens when this membrane has inflammation. This is caused by allergies. Common causes of allergic reactions (allergens)include:  Outdoor allergens, such as:  Pollen.  Grass and weeds.  Mold spores.  Indoor allergens, such as:  Dust.  Smoke.  Mold.  Pet dander.  Animal hair. This condition can make your eye red or pink. It can also make your eye feel itchy. This condition cannot be spread from one person to another person (is not contagious). Follow these instructions at home:  Try not to be around things that you are allergic to.  Take or apply over-the-counter and prescription medicines only as told by your doctor. These include any eye drops.  Place a cool, clean washcloth on your eye for 10-20 minutes. Do this 3-4 times a day.  Do not touch or rub your eyes.  Do not wear contact lenses until the inflammation is gone. Wear glasses instead.  Do not wear eye makeup until the inflammation is gone.  Keep all follow-up visits as told by your doctor. This is important. Contact a doctor if:  Your symptoms get worse.  Your symptoms do not get better with treatment.  You have mild eye pain.  You are sensitive to light,  You have spots or blisters on your eyes.  You have pus coming from your eye.  You have a fever. Get help right away if:  You have redness, swelling, or other symptoms in only one eye.  Your vision is blurry.  You have vision changes.  You have very bad eye pain. Summary  Allergic conjunctivitis is caused by allergies. It can make your eye red or pink, and it can make your eye feel itchy.  This condition cannot be spread from one person to another person (is not contagious).  Try not to be around things that you are allergic to.  Take or apply over-the-counter and prescription medicines  only as told by your doctor. These include any eye drops.  Contact your doctor if your symptoms get worse or they do not get better with treatment. This information is not intended to replace advice given to you by your health care provider. Make sure you discuss any questions you have with your health care provider. Document Released: 07/23/2009 Document Revised: 09/27/2015 Document Reviewed: 09/27/2015 Elsevier Interactive Patient Education  2017 Elsevier Inc.  

## 2016-01-23 NOTE — Progress Notes (Signed)
Patient is here for DM  Patient complains of HA's

## 2016-01-24 LAB — TSH: TSH: 3.08 mIU/L

## 2016-01-24 LAB — MICROALBUMIN / CREATININE URINE RATIO
Creatinine, Urine: 231 mg/dL (ref 20–320)
Microalb Creat Ratio: 8 mcg/mg creat (ref <30)
Microalb, Ur: 1.8 mg/dL

## 2016-02-18 ENCOUNTER — Telehealth: Payer: Self-pay | Admitting: *Deleted

## 2016-02-18 NOTE — Telephone Encounter (Signed)
-----   Message from Quentin Angstlugbemiga E Jegede, MD sent at 01/30/2016  8:50 AM EST ----- Please inform patient that her lab results are mostly normal, continue blood sugar control with current medications and dietary contrrol.

## 2016-02-18 NOTE — Telephone Encounter (Signed)
Medical Assistant used [employer] to contact patient.  Interpreter Name:Ali Interpreter #: [id number] Patient is aware of lab results being mostly normal and patient is advised to continue taking current medications. Patient expressed her understanding and had no further questions at this time.

## 2016-02-19 ENCOUNTER — Ambulatory Visit: Payer: Medicaid Other | Admitting: Nurse Practitioner

## 2016-02-20 ENCOUNTER — Encounter: Payer: Self-pay | Admitting: Nurse Practitioner

## 2016-02-24 MED FILL — metFORMIN HCL 500 MG TABS: 500 | 30 days supply | Qty: 60 | Fill #8

## 2016-02-24 MED FILL — risperiDONE 1 MG TABS: 1 | 30 days supply | Qty: 90 | Fill #8

## 2016-02-24 MED FILL — ACCU-CHEK AVIVA PLUS TEST S: 30 days supply | Qty: 100 | Fill #6

## 2016-03-12 ENCOUNTER — Ambulatory Visit (HOSPITAL_COMMUNITY)
Admission: EM | Admit: 2016-03-12 | Discharge: 2016-03-12 | Disposition: A | Discharge status: Home / Self Care | Payer: Medicaid Other | Attending: Family Medicine | Admitting: Family Medicine

## 2016-03-12 ENCOUNTER — Encounter (HOSPITAL_COMMUNITY): Payer: Self-pay | Admitting: Emergency Medicine

## 2016-03-12 DIAGNOSIS — R21 Rash and other nonspecific skin eruption: Secondary | ICD-10-CM | POA: Diagnosis not present

## 2016-03-12 DIAGNOSIS — J069 Acute upper respiratory infection, unspecified: Secondary | ICD-10-CM

## 2016-03-12 MED ORDER — MUPIROCIN 2 % EX OINT: 1.0000 "application " | TOPICAL_OINTMENT | Freq: Three times a day (TID) | CUTANEOUS | 1 refills | Status: DC

## 2016-03-12 MED ORDER — AZITHROMYCIN 250 MG PO TABS: 250.0000 mg | ORAL_TABLET | Freq: Every day | ORAL | 0 refills | Status: DC

## 2016-03-12 MED FILL — AZITHROMYCIN 250 MG TABLET: 250 | 5 days supply | Qty: 6 | Fill #0

## 2016-03-12 MED FILL — MUPIROCIN 2% OINTMENT: 2 | 20 days supply | Qty: 22 | Fill #0

## 2016-03-12 NOTE — Discharge Instructions (Signed)
The rash appears to be from either chafing or from a burn. The 8 by the cream taking twice a day should help this heal up over the next week.  Patient has an upper respiratory infection and because it's gone on for a week, I'm prescribing an antibiotic.

## 2016-03-12 NOTE — ED Provider Notes (Signed)
[facility]    CSN: [id number] Arrival date & time: 03/12/16  1137     History   Chief Complaint Chief Complaint  Patient presents with  . URI    HPI Makayla Roberts is a 31 y.o. female.   Pt c/o cold sx onset: 1 week   Sx include: prod cough,fever, ST, sneezing   Taking: OTC cold meds w/no relief.   Alert x1... Hx of mental redardation., deaf... NAD.   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  She's been bothered by a cough and fever as well as sore throat for the last week. She's had no nausea or vomiting.  In addition she has had a rash on her abdomen that was first noticed today. They're unsure how she developed this 2 x 4 cm oval rash.      Past Medical History:  Diagnosis Date  . Blind left eye   . Deaf   . Developmental delay   . Diabetes mellitus without complication (HCC)   . Mental retardation     Patient Active Problem List   Diagnosis Date Noted  . School physical exam 06/20/2015  . Type 2 diabetes mellitus without complication, without long-term current use of insulin (HCC) 06/20/2015  . Mental retardation 12/27/2014  . Tachycardia with hypertension 12/27/2014  . Right ear impacted cerumen 09/14/2014  . Upper respiratory infection 09/14/2014  . Sinus tachycardia 09/14/2014  . Type 2 diabetes mellitus without complication (HCC) 08/23/2014  . Redness of eye, left 08/23/2014  . Other specified diabetes mellitus without complications 04/19/2014  . Allergic rhinoconjunctivitis of left eye 03/15/2014  . Hypovitaminosis D 03/02/2013  . Unspecified hypothyroidism 03/02/2013  . Essential hypertension, benign 03/02/2013  . Blindness of left eye 01/11/2013  . Impaired speech 01/11/2013  . Developmental disability 01/11/2013    Past Surgical History:  Procedure Laterality Date  . None      OB History    Gravida Para Term Preterm AB Living   0 0 0 0 0     SAB TAB Ectopic Multiple Live  Births   0 0 0           Home Medications    Prior to Admission medications   Medication Sig Start Date End Date Taking? Authorizing Provider  carbamide peroxide (DEBROX) 6.5 % otic solution Place 5 drops into the right ear 2 (two) times daily. For 5 days 09/14/14  Yes Dessa Phi, MD  cetirizine (ZYRTEC) 10 MG tablet Take 1 tablet (10 mg total) by mouth daily. 03/15/14  Yes Josalyn Funches, MD  fluticasone (FLONASE) 50 MCG/ACT nasal spray Place 2 sprays into both nostrils at bedtime. 03/15/14  Yes Dessa Phi, MD  Lancet Devices [facility]) lancets Use as instructed 08/23/14  Yes Quentin Angst, MD  Lancets (ACCU-CHEK SOFT TOUCH) lancets Use as instructed 12/27/14  Yes Quentin Angst, MD  metFORMIN (GLUCOPHAGE) 500 MG tablet Take 1 tablet (500 mg total) by mouth 2 (two) times daily with a meal. 06/20/15  Yes Quentin Angst, MD  risperiDONE (RISPERDAL) 1 MG tablet Take 1 tablet (1 mg total) by mouth 3 (three) times daily. 06/20/15  Yes Quentin Angst, MD  ACCU-CHEK AVIVA PLUS test strip USE AS INSTRUCTED 08/26/15   Quentin Angst, MD  azithromycin (ZITHROMAX) 250 MG tablet Take 1 tablet (250 mg total) by mouth daily. Take first 2 tablets together, then 1 every day until finished. 03/12/16   Elvina Sidle,  MD  glucose blood (ACCU-CHEK AVIVA) test strip Use as instructed 12/27/14   Quentin Angstlugbemiga E Jegede, MD  mupirocin ointment (BACTROBAN) 2 % Apply 1 application topically 3 (three) times daily. 03/12/16   Elvina SidleKurt Detrell Umscheid, MD  ofloxacin (OCUFLOX) 0.3 % ophthalmic solution Place 2 drops into the left eye 4 (four) times daily. 01/23/16   Quentin Angstlugbemiga E Jegede, MD  tetrahydrozoline (VISINE) 0.05 % ophthalmic solution Place 2 drops into the left eye 3 (three) times daily. 08/23/14   Quentin Angstlugbemiga E Jegede, MD    Family History Family History  Problem Relation Age of Onset  . Hypertension Mother   . Arthritis Mother   . Heart disease Mother     Social History Social  History  Substance Use Topics  . Smoking status: Never Smoker  . Smokeless tobacco: Never Used  . Alcohol use No     Allergies   Patient has no known allergies.   Review of Systems Review of Systems  Constitutional: Positive for fever.  HENT: Positive for congestion and sore throat.   Respiratory: Positive for cough.   Musculoskeletal: Negative.   Skin: Positive for rash.  Neurological: Negative.      Physical Exam Triage Vital Signs ED Triage Vitals [03/12/16 1226]  Enc Vitals Group     BP (!) 145/107     Pulse Rate 98     Resp 22     Temp 97.8 F (36.6 C)     Temp Source Oral     SpO2 100 %     Weight      Height      Head Circumference      Peak Flow      Pain Score      Pain Loc      Pain Edu?      Excl. in GC?    No data found.   Updated Vital Signs BP (!) 145/107 (BP Location: Right Arm) Comment: pt will not sit still.   Pulse 98   Temp 97.8 F (36.6 C) (Oral)   Resp 22   SpO2 100%    Physical Exam  Constitutional: She is oriented to person, place, and time. She appears well-developed and well-nourished.  HENT:  Head: Normocephalic.  Right Ear: External ear normal.  Left Ear: External ear normal.  Mild erythema in the throat  Eyes: Conjunctivae are normal.  Left esotropia  Neck: Normal range of motion. Neck supple.  Cardiovascular: Normal rate, regular rhythm and normal heart sounds.   Pulmonary/Chest: Effort normal.  Musculoskeletal: Normal range of motion.  Neurological: She is alert and oriented to person, place, and time.  Skin: Skin is warm and dry.  2 x 4 cm denuded area on her abdomen just below umbilicus  Nursing note and vitals reviewed.    UC Treatments / Results  Labs (all labs ordered are listed, but only abnormal results are displayed) Labs Reviewed - No data to display  EKG  EKG Interpretation None       Radiology No results found.  Procedures Procedures (including critical care time)  Medications Ordered  in UC Medications - No data to display   Initial Impression / Assessment and Plan / UC Course  I have reviewed the triage vital signs and the nursing notes.  Pertinent labs & imaging results that were available during my care of the patient were reviewed by me and considered in my medical decision making (see chart for details).     Final Clinical Impressions(s) / UC  Diagnoses   Final diagnoses:  Upper respiratory tract infection, unspecified type  Rash    New Prescriptions New Prescriptions   AZITHROMYCIN (ZITHROMAX) 250 MG TABLET    Take 1 tablet (250 mg total) by mouth daily. Take first 2 tablets together, then 1 every day until finished.   MUPIROCIN OINTMENT (BACTROBAN) 2 %    Apply 1 application topically 3 (three) times daily.     Elvina Sidle, MD 03/12/16 1247

## 2016-03-12 NOTE — ED Triage Notes (Signed)
Pt c/o cold sx onset: 1 week   Sx include: prod cough,fever, ST, sneezing   Taking: OTC cold meds w/no relief.   Alert x1... Hx of mental redardation., deaf... NAD.

## 2016-03-23 MED FILL — risperiDONE 1 MG TABS: 1 | 30 days supply | Qty: 90 | Fill #2

## 2016-03-23 MED FILL — metFORMIN HCL 500 MG TABS: 500 | 30 days supply | Qty: 60 | Fill #9

## 2016-03-23 MED FILL — ACCU-CHEK AVIVA PLUS TEST S: 30 days supply | Qty: 100 | Fill #7

## 2016-04-23 ENCOUNTER — Other Ambulatory Visit: Payer: Self-pay | Admitting: Nurse Practitioner

## 2016-04-23 MED FILL — metFORMIN HCL 500 MG TABS: 500 | 30 days supply | Qty: 60 | Fill #10

## 2016-04-23 MED FILL — ACCU-CHEK AVIVA PLUS TEST S: 30 days supply | Qty: 100 | Fill #8

## 2016-05-12 ENCOUNTER — Ambulatory Visit (INDEPENDENT_AMBULATORY_CARE_PROVIDER_SITE_OTHER): Payer: Medicaid Other | Admitting: Nurse Practitioner

## 2016-05-12 ENCOUNTER — Encounter: Payer: Self-pay | Admitting: Nurse Practitioner

## 2016-05-12 VITALS — BP 132/92 | HR 127 | Ht 59.0 in | Wt 137.0 lb

## 2016-05-12 DIAGNOSIS — F79 Unspecified intellectual disabilities: Secondary | ICD-10-CM

## 2016-05-12 DIAGNOSIS — F89 Unspecified disorder of psychological development: Secondary | ICD-10-CM

## 2016-05-12 DIAGNOSIS — F69 Unspecified disorder of adult personality and behavior: Secondary | ICD-10-CM

## 2016-05-12 MED ORDER — RISPERIDONE 1 MG PO TABS: 1.0000 mg | ORAL_TABLET | Freq: Three times a day (TID) | ORAL | 11 refills | Status: DC

## 2016-05-12 NOTE — Progress Notes (Signed)
[facility]  PATIENT: Makayla Roberts DOB: 1985/05/13   REASON FOR VISIT: Mental retardation, behavior changes, developmental disability, blindness in the left eye HISTORY FROM: Mother and interpreter    HISTORY OF PRESENT ILLNESS:Makayla Roberts is a 31 year old female with a history of mental retardation, impaired speech and behavioral issues. The patient returns today for followup and interpreter is present. She is currently taken Risperdal and mom reports that it is working well for her.  She ran out of her medicine and has missed one dose, she does not sleep well without this according to . Family state that she is doing better since starting adult day care.  She goes  3 days a week and stays there for 8 hours. According to the mom she loves going to the day care. She has been diagnosed with diabetes since last seen. She also has seasonal allergies. She has been unable to get a CT of the head in the past due to not being cooperative even with medication. She returns for reevaluation HISTORY 03/14/13 Makayla Arabia(YAN):: immigrated from [place] 3 months ago, accompanied by her mother, interpreter, and a Child psychotherapistsocial worker at today's clinical visit  Mother had home delivery at [place] 29 years ago, it was a prolonged difficult delivery, she was blue and small when she was born, she was delayed in reaching her developmental milestones, began to walk at [age] years old, when she was 31 years old, physician at [place] told her mother that part of her brain was missing  she never went to school, developmentally delayed, was not able to speak, tends to have behavior issues  Since she moved to [place] ago, living in apartment with her mother, she no longer have other relatives to help taking care of her,  Mother felt overwhelming, patient has gained a lot of weight also become agitated easily, she eats everything that she can get her hands on,  Sometimes she is out of control, began developing hair  out of her head, also be violent towards her mother, she has difficulty sleeping,  There was no history of seizure, mother has 3 other children, that is all healthy    REVIEW OF SYSTEMS: Full 14 system review of systems performed and notable only for those listed, all others are neg:  Constitutional: neg  Cardiovascular: neg Ear/Nose/Throat: neg  Skin: neg Eyes: Blind in the left eye Respiratory: neg Gastroitestinal: neg  Hematology/Lymphatic: neg  Endocrine: neg Musculoskeletal:neg Allergy/Immunology: Seasonal Neurological: neg Psychiatric: No recent behavior issues Sleep : neg   ALLERGIES: No Known Allergies  HOME MEDICATIONS: Outpatient Medications Prior to Visit  Medication Sig Dispense Refill  . ACCU-CHEK AVIVA PLUS test strip USE AS INSTRUCTED 100 each 12  . fluticasone (FLONASE) 50 MCG/ACT nasal spray Place 2 sprays into both nostrils at bedtime. 16 g 6  . glucose blood (ACCU-CHEK AVIVA) test strip Use as instructed 100 each 12  . Lancet Devices (ACCU-CHEK SOFTCLIX) lancets Use as instructed 1 each 0  . Lancets (ACCU-CHEK SOFT TOUCH) lancets Use as instructed 100 each 12  . metFORMIN (GLUCOPHAGE) 500 MG tablet Take 1 tablet (500 mg total) by mouth 2 (two) times daily with a meal. 180 tablet 3  . mupirocin ointment (BACTROBAN) 2 % Apply 1 application topically 3 (three) times daily. 22 g 1  . ofloxacin (OCUFLOX) 0.3 % ophthalmic solution Place 2 drops into the left eye 4 (four) times daily. 5 mL 0  . risperiDONE (RISPERDAL) 1 MG tablet Take 1 tablet (  1 mg total) by mouth 3 (three) times daily. (Patient not taking: Reported on 05/12/2016) 90 tablet 8  . azithromycin (ZITHROMAX) 250 MG tablet Take 1 tablet (250 mg total) by mouth daily. Take first 2 tablets together, then 1 every day until finished. 6 tablet 0  . carbamide peroxide (DEBROX) 6.5 % otic solution Place 5 drops into the right ear 2 (two) times daily. For 5 days 15 mL 0  . cetirizine (ZYRTEC) 10 MG tablet Take 1  tablet (10 mg total) by mouth daily. 90 tablet 1  . tetrahydrozoline (VISINE) 0.05 % ophthalmic solution Place 2 drops into the left eye 3 (three) times daily. 15 mL 3   No facility-administered medications prior to visit.     PAST MEDICAL HISTORY: Past Medical History:  Diagnosis Date  . Blind left eye   . Deaf   . Developmental delay   . Diabetes mellitus without complication (HCC)   . Mental retardation     PAST SURGICAL HISTORY: Past Surgical History:  Procedure Laterality Date  . None      FAMILY HISTORY: Family History  Problem Relation Age of Onset  . Hypertension Mother   . Arthritis Mother   . Heart disease Mother     SOCIAL HISTORY: Social History   Social History  . Marital status: Single    Spouse name: N/A  . Number of children: N/A  . Years of education: N/A   Occupational History  . Not on file.   Social History Main Topics  . Smoking status: Never Smoker  . Smokeless tobacco: Never Used  . Alcohol use No  . Drug use: No  . Sexual activity: No   Other Topics Concern  . Not on file   Social History Narrative   ** Merged History Encounter **       Patient lives at home with her mom (Zenind). Education- None Left handed. Caffeine- Some hot tea  Patient Special Coordinator Evon Slack - [phone] -cell, [phone] and fax [phone].     PHYSICAL EXAM  Vitals:   05/12/16 0746  BP: (!) 132/92  Pulse: (!) 127  Weight: 137 lb (62.1 kg)  Height: 4\' 11"  (1.499 m)   Body mass index is 27.67 kg/m. Generalized: Well developed, Mildly obese female in no acute distress   Neurological examination  Mentation: Nonverbal, unable to follow directions.  Cranial nerve II-XII: Left cornea opaque, right pupil irregular but reactive to light. . Extraocular movements were full. Facial symmetry present. Motor: The motor testing reveals 5 over 5 strength of all 4 extremities. Good symmetric motor tone is noted throughout.  Sensory:  could not assess properly withdraws to pain Coordination: could not assess Gait and station: ambulates independently and without difficulty. Mother at her side Reflexes: Deep tendon reflexes are symmetric and normal bilaterally.    DIAGNOSTIC DATA (LABS, IMAGING, TESTING) - I reviewed patient records, labs, notes, testing and imaging myself where available.  Lab Results  Component Value Date   WBC 7.3 01/23/2016   HGB 14.3 01/23/2016   HCT 41.7 01/23/2016   MCV 89.5 01/23/2016   PLT 264 01/23/2016      Component Value Date/Time   NA 141 01/23/2016 1642   K 4.2 01/23/2016 1642   CL 104 01/23/2016 1642   CO2 26 01/23/2016 1642   GLUCOSE 180 (H) 01/23/2016 1642   BUN 12 01/23/2016 1642   CREATININE 0.83 01/23/2016 1642   CALCIUM 9.9 01/23/2016 1642   PROT  7.3 01/23/2016 1642   ALBUMIN 4.3 01/23/2016 1642   AST 12 01/23/2016 1642   ALT 12 01/23/2016 1642   ALKPHOS 58 01/23/2016 1642   BILITOT 0.3 01/23/2016 1642   GFRNONAA >89 01/23/2016 1642   GFRAA >89 01/23/2016 1642    Lab Results  Component Value Date   HGBA1C 6.3 01/23/2016       ASSESSMENT AND PLAN 31 y.o. year old female has a past medical history of Blind left eye; Mental retardation; Diabetes mellitus without complication; and Deaf. here to follow-up for 1. Mental retardation with developmental delays 2. Behavior issues  Continue Risperdal at current dose will refill F/U yearly and prn I spent 20 min in total face to face time with the patient , mom and intrepretor more than 50% of which was spent counseling and coordination of care, reviewing recent lab results reviewing medications and discussing and reviewing the diagnosis of her mental retardation with developmental delays with behavior issues.Interpreter was used to communicate directly with the patient and mother for the entire encounter including providing detailed instructions. She was given instructions if her behavior issues worsen.  Nilda Riggs, [facility], [facility], APRN  [facility] [address] [place], [place] [address] [phone]

## 2016-05-12 NOTE — Patient Instructions (Signed)
Continue Risperdal at current dose will refill F/U yearly and prn 

## 2016-05-13 NOTE — Progress Notes (Signed)
I have reviewed and agreed above plan. 

## 2016-05-19 ENCOUNTER — Telehealth: Payer: Self-pay | Admitting: *Deleted

## 2016-05-19 NOTE — Telephone Encounter (Signed)
Called [facility] [employer] to initiate PA for risperidone. Spoke with [place], [place] approved  # [id number], expires 05/14/2017. Interaction ID : [id number]

## 2016-05-22 MED FILL — metFORMIN HCL 500 MG TABS: 500 | 30 days supply | Qty: 60 | Fill #11

## 2016-05-22 MED FILL — ACCU-CHEK AVIVA PLUS TEST S: 30 days supply | Qty: 100 | Fill #9

## 2016-05-22 MED FILL — risperiDONE 1 MG TABS: 1 | 30 days supply | Qty: 90 | Fill #0

## 2016-06-25 ENCOUNTER — Other Ambulatory Visit: Payer: Self-pay | Admitting: Internal Medicine

## 2016-06-25 DIAGNOSIS — E119 Type 2 diabetes mellitus without complications: Secondary | ICD-10-CM

## 2016-06-25 MED FILL — ACCU-CHEK AVIVA PLUS TEST S: 30 days supply | Qty: 100 | Fill #10

## 2016-06-25 MED FILL — risperiDONE 1 MG TABS: 1 | 30 days supply | Qty: 90 | Fill #1

## 2016-06-26 MED FILL — metFORMIN HCL 500 MG TABS: 500 | 30 days supply | Qty: 60 | Fill #0

## 2016-07-27 ENCOUNTER — Other Ambulatory Visit: Payer: Self-pay | Admitting: Internal Medicine

## 2016-07-27 DIAGNOSIS — E119 Type 2 diabetes mellitus without complications: Secondary | ICD-10-CM

## 2016-07-27 MED FILL — risperiDONE 1 MG TABS: 1 | 30 days supply | Qty: 90 | Fill #2

## 2016-07-27 MED FILL — ACCU-CHEK AVIVA PLUS TEST S: 30 days supply | Qty: 100 | Fill #11

## 2016-08-12 ENCOUNTER — Ambulatory Visit: Payer: Medicaid Other | Attending: Internal Medicine | Admitting: Internal Medicine

## 2016-08-12 VITALS — BP 125/85 | HR 117 | Temp 98.2°F | Resp 20 | Ht 59.0 in | Wt 134.0 lb

## 2016-08-12 DIAGNOSIS — Z0000 Encounter for general adult medical examination without abnormal findings: Secondary | ICD-10-CM | POA: Diagnosis not present

## 2016-08-12 DIAGNOSIS — F79 Unspecified intellectual disabilities: Secondary | ICD-10-CM | POA: Diagnosis not present

## 2016-08-12 DIAGNOSIS — F89 Unspecified disorder of psychological development: Secondary | ICD-10-CM | POA: Diagnosis not present

## 2016-08-12 DIAGNOSIS — H9190 Unspecified hearing loss, unspecified ear: Secondary | ICD-10-CM | POA: Diagnosis not present

## 2016-08-12 DIAGNOSIS — Z7984 Long term (current) use of oral hypoglycemic drugs: Secondary | ICD-10-CM | POA: Diagnosis not present

## 2016-08-12 DIAGNOSIS — H578 Other specified disorders of eye and adnexa: Secondary | ICD-10-CM | POA: Diagnosis not present

## 2016-08-12 DIAGNOSIS — R632 Polyphagia: Secondary | ICD-10-CM | POA: Diagnosis not present

## 2016-08-12 DIAGNOSIS — E119 Type 2 diabetes mellitus without complications: Secondary | ICD-10-CM | POA: Insufficient documentation

## 2016-08-12 DIAGNOSIS — R6250 Unspecified lack of expected normal physiological development in childhood: Secondary | ICD-10-CM | POA: Insufficient documentation

## 2016-08-12 DIAGNOSIS — Z79899 Other long term (current) drug therapy: Secondary | ICD-10-CM | POA: Diagnosis not present

## 2016-08-12 DIAGNOSIS — R6889 Other general symptoms and signs: Secondary | ICD-10-CM | POA: Insufficient documentation

## 2016-08-12 DIAGNOSIS — H5462 Unqualified visual loss, left eye, normal vision right eye: Secondary | ICD-10-CM | POA: Insufficient documentation

## 2016-08-12 LAB — POCT GLYCOSYLATED HEMOGLOBIN (HGB A1C): Hemoglobin A1C: 6.5

## 2016-08-12 MED ORDER — METFORMIN HCL 500 MG PO TABS: 500.0000 mg | ORAL_TABLET | Freq: Two times a day (BID) | ORAL | 3 refills | Status: DC

## 2016-08-12 MED ORDER — KETOTIFEN FUMARATE 0.025 % OP SOLN: 1.0000 [drp] | Freq: Two times a day (BID) | OPHTHALMIC | 1 refills | Status: DC

## 2016-08-12 MED FILL — ?METFORMIN HCL 500MG TABLET: 500 | 30 days supply | Qty: 60 | Fill #0

## 2016-08-12 MED FILL — SM EYE ITCH RELIEF 0.025% D: 0.025 % | 40 days supply | Qty: 5 | Fill #0

## 2016-08-12 NOTE — Patient Instructions (Signed)
Diabetes Mellitus and Exercise Exercising regularly is important for your overall health, especially when you have diabetes (diabetes mellitus). Exercising is not only about losing weight. It has many health benefits, such as increasing muscle strength and bone density and reducing body fat and stress. This leads to improved fitness, flexibility, and endurance, all of which result in better overall health. Exercise has additional benefits for people with diabetes, including:  Reducing appetite.  Helping to lower and control blood glucose.  Lowering blood pressure.  Helping to control amounts of fatty substances (lipids) in the blood, such as cholesterol and triglycerides.  Helping the body to respond better to insulin (improving insulin sensitivity).  Reducing how much insulin the body needs.  Decreasing the risk for heart disease by: ? Lowering cholesterol and triglyceride levels. ? Increasing the levels of good cholesterol. ? Lowering blood glucose levels.  What is my activity plan? Your health care provider or certified diabetes educator can help you make a plan for the type and frequency of exercise (activity plan) that works for you. Make sure that you:  Do at least 150 minutes of moderate-intensity or vigorous-intensity exercise each week. This could be brisk walking, biking, or water aerobics. ? Do stretching and strength exercises, such as yoga or weightlifting, at least 2 times a week. ? Spread out your activity over at least 3 days of the week.  Get some form of physical activity every day. ? Do not go more than 2 days in a row without some kind of physical activity. ? Avoid being inactive for more than 90 minutes at a time. Take frequent breaks to walk or stretch.  Choose a type of exercise or activity that you enjoy, and set realistic goals.  Start slowly, and gradually increase the intensity of your exercise over time.  What do I need to know about managing my  diabetes?  Check your blood glucose before and after exercising. ? If your blood glucose is higher than 240 mg/dL (13.3 mmol/L) before you exercise, check your urine for ketones. If you have ketones in your urine, do not exercise until your blood glucose returns to normal.  Know the symptoms of low blood glucose (hypoglycemia) and how to treat it. Your risk for hypoglycemia increases during and after exercise. Common symptoms of hypoglycemia can include: ? Hunger. ? Anxiety. ? Sweating and feeling clammy. ? Confusion. ? Dizziness or feeling light-headed. ? Increased heart rate or palpitations. ? Blurry vision. ? Tingling or numbness around the mouth, lips, or tongue. ? Tremors or shakes. ? Irritability.  Keep a rapid-acting carbohydrate snack available before, during, and after exercise to help prevent or treat hypoglycemia.  Avoid injecting insulin into areas of the body that are going to be exercised. For example, avoid injecting insulin into: ? The arms, when playing tennis. ? The legs, when jogging.  Keep records of your exercise habits. Doing this can help you and your health care provider adjust your diabetes management plan as needed. Write down: ? Food that you eat before and after you exercise. ? Blood glucose levels before and after you exercise. ? The type and amount of exercise you have done. ? When your insulin is expected to peak, if you use insulin. Avoid exercising at times when your insulin is peaking.  When you start a new exercise or activity, work with your health care provider to make sure the activity is safe for you, and to adjust your insulin, medicines, or food intake as needed.    Drink plenty of water while you exercise to prevent dehydration or heat stroke. Drink enough fluid to keep your urine clear or pale yellow. This information is not intended to replace advice given to you by your health care provider. Make sure you discuss any questions you have with  your health care provider. Document Released: 04/25/2003 Document Revised: 08/23/2015 Document Reviewed: 07/15/2015 Elsevier Interactive Patient Education  2018 Else[employer]. Blood Glucose Monitoring, Adult Monitoring your blood sugar (glucose) helps you manage your diabetes. It also helps you and your health care provider determine how well your diabetes management plan is working. Blood glucose monitoring involves checking your blood glucose as often as directed, and keeping a record (log) of your results over time. Why should I monitor my blood glucose? Checking your blood glucose regularly can:  Help you understand how food, exercise, illnesses, and medicines affect your blood glucose.  Let you know what your blood glucose is at any time. You can quickly tell if you are having low blood glucose (hypoglycemia) or high blood glucose (hyperglycemia).  Help you and your health care provider adjust your medicines as needed.  When should I check my blood glucose? Follow instructions from your health care provider about how often to check your blood glucose. This may depend on:  The type of diabetes you have.  How well-controlled your diabetes is.  Medicines you are taking.  If you have type 1 diabetes:  Check your blood glucose at least 2 times a day.  Also check your blood glucose: ? Before every insulin injection. ? Before and after exercise. ? Between meals. ? 2 hours after a meal. ? Occasionally between 2:00 a.m. and 3:00 a.m., as directed. ? Before potentially dangerous tasks, like driving or using heavy machinery. ? At bedtime.  You may need to check your blood glucose more often, up to 6-10 times a day: ? If you use an insulin pump. ? If you need multiple daily injections (MDI). ? If your diabetes is not well-controlled. ? If you are ill. ? If you have a history of severe hypoglycemia. ? If you have a history of not knowing when your blood glucose is getting low  (hypoglycemia unawareness). If you have type 2 diabetes:  If you take insulin or other diabetes medicines, check your blood glucose at least 2 times a day.  If you are on intensive insulin therapy, check your blood glucose at least 4 times a day. Occasionally, you may also need to check between 2:00 a.m. and 3:00 a.m., as directed.  Also check your blood glucose: ? Before and after exercise. ? Before potentially dangerous tasks, like driving or using heavy machinery.  You may need to check your blood glucose more often if: ? Your medicine is being adjusted. ? Your diabetes is not well-controlled. ? You are ill. What is a blood glucose log?  A blood glucose log is a record of your blood glucose readings. It helps you and your health care provider: ? Look for patterns in your blood glucose over time. ? Adjust your diabetes management plan as needed.  Every time you check your blood glucose, write down your result and notes about things that may be affecting your blood glucose, such as your diet and exercise for the day.  Most glucose meters store a record of glucose readings in the meter. Some meters allow you to download your records to a computer. How do I check my blood glucose? Follow these steps to get accurate   readings of your blood glucose: Supplies needed   Blood glucose meter.  Test strips for your meter. Each meter has its own strips. You must use the strips that come with your meter.  A needle to prick your finger (lancet). Do not use lancets more than once.  A device that holds the lancet (lancing device).  A journal or log book to write down your results. Procedure  Wash your hands with soap and water.  Prick the side of your finger (not the tip) with the lancet. Use a different finger each time.  Gently rub the finger until a small drop of blood appears.  Follow instructions that come with your meter for inserting the test strip, applying blood to the strip,  and using your blood glucose meter.  Write down your result and any notes. Alternative testing sites  Some meters allow you to use areas of your body other than your finger (alternative sites) to test your blood.  If you think you may have hypoglycemia, or if you have hypoglycemia unawareness, do not use alternative sites. Use your finger instead.  Alternative sites may not be as accurate as the fingers, because blood flow is slower in these areas. This means that the result you get may be delayed, and it may be different from the result that you would get from your finger.  The most common alternative sites are: ? Forearm. ? Thigh. ? Palm of the hand. Additional tips  Always keep your supplies with you.  If you have questions or need help, all blood glucose meters have a 24-hour "hotline" number that you can call. You may also contact your health care provider.  After you use a few boxes of test strips, adjust (calibrate) your blood glucose meter by following instructions that came with your meter. This information is not intended to replace advice given to you by your health care provider. Make sure you discuss any questions you have with your health care provider. Document Released: 02/05/2003 Document Revised: 08/23/2015 Document Reviewed: 07/15/2015 Elsevier Interactive Patient Education  2017 Elsevier Inc.  

## 2016-08-12 NOTE — Progress Notes (Signed)
Makayla Roberts Hey, is a 31 y.o. female  [id number]  [id number]  DOB - 07-24-1985  Chief Complaint  Patient presents with  . Annual Exam      Subjective:   Makayla Roberts Makayla Roberts is a 31 y.o. female with history of mental retardation, deafness, blindness, impaired speech, behavior issues and type 2 diabetes mellitus on metformin here today for a follow up visit for DM and medication refill. The patient is here with mom, patient follows up with neurologist for mental retardation and other neurological issues. Mom said patient has persistent itchy eyes especially the left eye. She also eats all the time and mom says it;s difficult to control her voracious appetite. She is noticed to be gaining weight as a result. Patient is compliant with medications. No hypoglycemic episodes.   Problem  Itchy Eyes  Polyphagia    ALLERGIES: No Known Allergies  PAST MEDICAL HISTORY: Past Medical History:  Diagnosis Date  . Blind left eye   . Deaf   . Developmental delay   . Diabetes mellitus without complication (HCC)   . Mental retardation     MEDICATIONS AT HOME: Prior to Admission medications   Medication Sig Start Date End Date Taking? Authorizing Provider  ACCU-CHEK AVIVA PLUS test strip USE AS INSTRUCTED 08/26/15   Quentin AngstJegede, Terence Googe E, MD  fluticasone (FLONASE) 50 MCG/ACT nasal spray Place 2 sprays into both nostrils at bedtime. 03/15/14   Funches, Gerilyn NestleJosalyn, MD  glucose blood (ACCU-CHEK AVIVA) test strip Use as instructed 12/27/14   Quentin AngstJegede, Briony Parveen E, MD  ketotifen (ZADITOR) 0.025 % ophthalmic solution Place 1 drop into both eyes 2 (two) times daily. 08/12/16   Quentin AngstJegede, Deklan Minar E, MD  Lancets (ACCU-CHEK SOFT TOUCH) lancets Use as instructed 12/27/14   Quentin AngstJegede, Dayton Kenley E, MD  metFORMIN (GLUCOPHAGE) 500 MG tablet Take 1 tablet (500 mg total) by mouth 2 (two) times daily with a meal. 08/12/16   Krystale Rinkenberger, Phylliss Blakeslugbemiga E, MD  risperiDONE (RISPERDAL) 1 MG tablet Take 1 tablet (1 mg total) by mouth 3  (three) times daily. 05/12/16   Nilda RiggsMartin, Nancy Carolyn, NP    Objective:   Vitals:   08/12/16 1103  BP: 125/85  Pulse: (!) 117  Resp: 20  Temp: 98.2 F (36.8 C)  TempSrc: Oral  SpO2: 94%  Weight: 134 lb (60.8 kg)  Height: 4\' 11"  (1.499 m)   Exam General appearance : Awake, alert, not in any distress. Not toxic looking, non-verbal, blind left eye HEENT: Atraumatic and Normocephalic, pupils equally reactive to light and accomodation Neck: Supple, no JVD. No cervical lymphadenopathy.  Chest: Good air entry bilaterally, no added sounds  CVS: S1 S2 regular, no murmurs.  Abdomen: Bowel sounds present, Non tender and not distended with no gaurding, rigidity or rebound. Extremities: B/L Lower Ext shows no edema, both legs are warm to touch Neurology: Awake alert, and oriented X 3, CN II-XII intact, Non focal Skin: No Rash  Data Review Lab Results  Component Value Date   HGBA1C 6.5 08/12/2016   HGBA1C 6.3 01/23/2016   HGBA1C 5.90 12/27/2014    Assessment & Plan   1. Type 2 diabetes mellitus without complication, without long-term current use of insulin (HCC)  - HgB A1c - Ambulatory referral to Ophthalmology   - metFORMIN (GLUCOPHAGE) 500 MG tablet; Take 1 tablet (500 mg total) by mouth 2 (two) times daily with a meal.  Dispense: 60 tablet; Refill: 3  2. Developmental disability  Continue regular follow up with neurologist  3. Itchy eyes  -  ketotifen (ZADITOR) 0.025 % ophthalmic solution; Place 1 drop into both eyes 2 (two) times daily.  Dispense: 5 mL; Refill: 1  4. Polyphagia  - Amb ref to Medical Nutrition Therapy-MNT  Other issues discussed during this visit include: low cholesterol diet, weight control and daily exercise, foot care, annual eye examinations at Ophthalmology, importance of adherence with medications and regular follow-up. We also discussed long term complications of uncontrolled diabetes.  Return in about 6 months (around 02/11/2017) for Hemoglobin  A1C and Follow up, DM, Follow up Pain and comorbidities.  This note has been created with Education officer, environmental. Any transcriptional errors are unintentional.    Jeanann Lewandowsky, MD, MHA, FACP, FAAP, CPE [facility] [place], [place] [phone]   08/12/2016, 12:10 PM

## 2016-08-17 ENCOUNTER — Encounter: Payer: Self-pay | Admitting: Internal Medicine

## 2016-09-02 ENCOUNTER — Encounter: Payer: Medicaid Other | Attending: Internal Medicine | Admitting: *Deleted

## 2016-09-02 DIAGNOSIS — Z713 Dietary counseling and surveillance: Secondary | ICD-10-CM | POA: Diagnosis not present

## 2016-09-02 DIAGNOSIS — E119 Type 2 diabetes mellitus without complications: Secondary | ICD-10-CM | POA: Insufficient documentation

## 2016-09-04 NOTE — Patient Instructions (Addendum)
Plan: Continue providing egg or other protein at each meal Consider 2-3 servings of starch or fruit at each meal as these foods directly affect BG and should not be consumed in excess Use of vegetables as added foods to meal or for snacks has minimal affect on BG Providing some activity or exercise in the afternoon should be helpful.  Continue checking BG daily as prescribed by her MD Continue providing her with her mediations as prescribed.

## 2016-09-04 NOTE — Progress Notes (Signed)
Diabetes Self-Management Education  Visit Type: First/Initial  Appt. Start Time: 1600 Appt. End Time: 1700  09/04/2016  [name], identified by name and date of birth, is a 31 y.o. female with a diagnosis of Diabetes: Type 2. Patient is here with her mother and cousin. Mother verbalized agreement for cousin to interpret for this visit. They speak Arabic. Mother states patient goes to day care during the week. She states she gets aggressive asking for food whenever she is awake. Mother feels she gets plenty of food at day care and snacks are sent home on the bus, with only the wrappers left when patient arrives home. She wants to know how to handle this aggressive behavior towards wanting more food all of the time.   ASSESSMENT  Height 4\' 11"  (1.499 m), weight 134 lb (60.8 kg). Body mass index is 27.06 kg/m.      Diabetes Self-Management Education - 09/02/16 1554      Visit Information   Visit Type First/Initial     Initial Visit   Diabetes Type Type 2   Are you currently following a meal plan? No   Are you taking your medications as prescribed? Yes   Date Diagnosed 2015     Health Coping   How would you rate your overall health? Good     Psychosocial Assessment   Self-care barriers Low literacy;Hard of hearing;Other (comment)  limited cognition   Other persons present Patient;Family Member   Patient Concerns Nutrition/Meal planning   Special Needs Instruct caregiver   How often do you need to have someone help you when you read instructions, pamphlets, or other written materials from your doctor or pharmacy? 5 - Always   What is the last grade level you completed in school? N/A     Complications   Last HgB A1C per patient/outside source 6.5 %   How often do you check your blood sugar? > 4 times/day   Have you had a dilated eye exam in the past 12 months? Yes   Have you had a dental exam in the past 12 months? No   Are you checking your feet? Yes   How many days  per week are you checking your feet? 1     Dietary Intake   Breakfast boiled egg, occasionally toast, cheese, fresh fruit before the bus takes her to day care   Lunch eats at school except on weekends   Dinner meat, vegetables, fresh fruit   Beverage(s) water     Exercise   Exercise Type ADL's   How many days per week to you exercise? 1   How many minutes per day do you exercise? 25   Total minutes per week of exercise 25     Patient Education   Previous Diabetes Education Yes (please comment)   Disease state  Explored patient's options for treatment of their diabetes   Nutrition management  Meal options for control of blood glucose level and chronic complications.;Role of diet in the treatment of diabetes and the relationship between the three main macronutrients and blood glucose level;Meal timing in regards to the patients' current diabetes medication.   Physical activity and exercise  Helped patient identify appropriate exercises in relation to his/her diabetes, diabetes complications and other health issue.     Individualized Goals (developed by patient)   Nutrition General guidelines for healthy choices and portions discussed   Medications take my medication as prescribed   Monitoring  test my blood glucose as discussed  Post-Education Assessment   Patient understands incorporating nutritional management into lifestyle. Demonstrates understanding / competency     Outcomes   Expected Outcomes Demonstrated interest in learning. Expect positive outcomes   Future DMSE PRN      Individualized Plan for Diabetes Self-Management Training:   Learning Objective:  Patient will have a greater understanding of diabetes self-management. Patient education plan is to attend individual and/or group sessions per assessed needs and concerns.   Plan:   Patient Instructions  Plan: Continue providing egg or other protein at each meal Consider 2-3 servings of starch or fruit at each  meal as these foods directly affect BG and should not be consumed in excess Use of vegetables as added foods to meal or for snacks has minimal affect on BG Providing some activity or exercise in the afternoon should be helpful.  Continue checking BG daily as prescribed by her MD Continue providing her with her mediations as prescribed.   Expected Outcomes:  Demonstrated interest in learning. Expect positive outcomes  Education material provided: Carbohydrate counting sheet in Arabic  If problems or questions, patient to contact team via:  Phone  Future DSME appointment: PRN

## 2016-09-18 ENCOUNTER — Other Ambulatory Visit: Payer: Self-pay | Admitting: Internal Medicine

## 2016-09-18 MED FILL — risperiDONE 1 MG TABS: 1 | 30 days supply | Qty: 90 | Fill #3

## 2016-09-18 MED FILL — metFORMIN HCL 500 MG TABS: 500 | 30 days supply | Qty: 60 | Fill #1

## 2016-09-21 MED FILL — ACCU-CHEK AVIVA PLUS TEST S: 30 days supply | Qty: 100 | Fill #0

## 2016-09-24 MED FILL — PATADAY 0.2% EYE DROPS: 0.2 | 25 days supply | Qty: 3 | Fill #0

## 2016-10-26 MED FILL — risperiDONE 1 MG TABS: 1 | 30 days supply | Qty: 90 | Fill #4

## 2016-10-26 MED FILL — PATADAY 0.2% EYE DROPS: 0.2 | 25 days supply | Qty: 3 | Fill #1

## 2016-10-26 MED FILL — metFORMIN HCL 500 MG TABS: 500 | 30 days supply | Qty: 60 | Fill #2

## 2016-10-29 MED FILL — ACCU-CHEK AVIVA PLUS TEST S: 30 days supply | Qty: 100 | Fill #1

## 2016-11-20 ENCOUNTER — Telehealth: Payer: Self-pay

## 2016-11-20 NOTE — Telephone Encounter (Signed)
SCAT application completed with the assistance of Lynnae Sandhoff, [facility] TCC Coordinator who spoke to the patient's mother with Arabic interpreter with [employer].  Completed application faxed to SCAT eligibility.

## 2016-11-23 MED FILL — risperiDONE 1 MG TABS: 1 | 30 days supply | Qty: 90 | Fill #5

## 2016-11-23 MED FILL — metFORMIN HCL 500 MG TABS: 500 | 30 days supply | Qty: 60 | Fill #3

## 2016-11-23 MED FILL — ACCU-CHEK AVIVA PLUS TEST S: 30 days supply | Qty: 100 | Fill #2

## 2016-11-26 MED FILL — PATADAY 0.2% EYE DROPS: 0.2 | 25 days supply | Qty: 3 | Fill #2

## 2016-12-11 ENCOUNTER — Telehealth: Payer: Self-pay | Admitting: Internal Medicine

## 2016-12-11 NOTE — Telephone Encounter (Signed)
Call placed to patient #[phone] with Arabic interpreter on the line Ashok Croon(Asman [phone]) to ask her about her SCAT application and evaluation and if she got approved. No answer. Interpreter left message asking patient to return my call.

## 2016-12-30 ENCOUNTER — Other Ambulatory Visit: Payer: Self-pay | Admitting: Internal Medicine

## 2016-12-30 DIAGNOSIS — E119 Type 2 diabetes mellitus without complications: Secondary | ICD-10-CM

## 2017-01-05 ENCOUNTER — Telehealth: Payer: Self-pay | Admitting: Internal Medicine

## 2017-01-05 NOTE — Telephone Encounter (Signed)
Call placed to patient #[phone] using Arabic interpreter Hulan Amato(Mustafa [phone]) to check on the status of her SCAT application. No answer. Mustafa left message in Arabic asking patient's mother to return the call.   Call placed to [employer] #[phone] to check on the status of patient's application but recording stated that office was closed.

## 2017-01-06 NOTE — Telephone Encounter (Signed)
Call placed to [employer] #[phone] to check on the status of patient's application. Spoke with Annice PihJackie and she informed me that patient's transportation benefit was renewed until November 2021.

## 2017-01-11 MED FILL — risperiDONE 1 MG TABS: 1 | 30 days supply | Qty: 90 | Fill #6

## 2017-01-11 MED FILL — metFORMIN HCL 500 MG TABS: 500 | 30 days supply | Qty: 60 | Fill #0

## 2017-01-11 MED FILL — PATADAY 0.2% EYE DROPS: 0.2 | 25 days supply | Qty: 3 | Fill #3

## 2017-01-11 MED FILL — ACCU-CHEK AVIVA PLUS TEST S: 30 days supply | Qty: 100 | Fill #3

## 2017-02-04 MED FILL — risperiDONE 1 MG TABS: 1 | 30 days supply | Qty: 90 | Fill #7

## 2017-02-04 MED FILL — ACCU-CHEK AVIVA PLUS TEST S: 30 days supply | Qty: 100 | Fill #4

## 2017-02-04 MED FILL — PATADAY 0.2% EYE DROPS: 0.2 | 25 days supply | Qty: 3 | Fill #4

## 2017-02-04 MED FILL — metFORMIN HCL 500 MG TABS: 500 | 30 days supply | Qty: 60 | Fill #1

## 2017-02-26 ENCOUNTER — Other Ambulatory Visit: Payer: Self-pay | Admitting: Internal Medicine

## 2017-02-26 DIAGNOSIS — E119 Type 2 diabetes mellitus without complications: Secondary | ICD-10-CM

## 2017-02-26 MED FILL — risperiDONE 1 MG TABS: 1 | 30 days supply | Qty: 90 | Fill #8

## 2017-02-26 MED FILL — PATADAY 0.2% EYE DROPS: 0.2 | 25 days supply | Qty: 3 | Fill #5

## 2017-03-01 MED FILL — metFORMIN HCL 500 MG TABS: 500 | 30 days supply | Qty: 60 | Fill #0

## 2017-03-03 ENCOUNTER — Other Ambulatory Visit: Payer: Self-pay

## 2017-03-03 ENCOUNTER — Encounter: Payer: Self-pay | Admitting: Internal Medicine

## 2017-03-03 ENCOUNTER — Ambulatory Visit: Payer: Medicaid Other | Attending: Internal Medicine | Admitting: Internal Medicine

## 2017-03-03 VITALS — BP 120/78 | HR 106 | Temp 97.5°F | Resp 16 | Wt 136.0 lb

## 2017-03-03 DIAGNOSIS — F89 Unspecified disorder of psychological development: Secondary | ICD-10-CM

## 2017-03-03 DIAGNOSIS — J029 Acute pharyngitis, unspecified: Secondary | ICD-10-CM | POA: Insufficient documentation

## 2017-03-03 DIAGNOSIS — Z79899 Other long term (current) drug therapy: Secondary | ICD-10-CM | POA: Diagnosis not present

## 2017-03-03 DIAGNOSIS — Z7984 Long term (current) use of oral hypoglycemic drugs: Secondary | ICD-10-CM | POA: Insufficient documentation

## 2017-03-03 DIAGNOSIS — E119 Type 2 diabetes mellitus without complications: Secondary | ICD-10-CM | POA: Diagnosis not present

## 2017-03-03 LAB — GLUCOSE, POCT (MANUAL RESULT ENTRY): POC Glucose: 114 mg/dl — AB (ref 70–99)

## 2017-03-03 LAB — POCT GLYCOSYLATED HEMOGLOBIN (HGB A1C): Hemoglobin A1C: 7.1

## 2017-03-03 MED ORDER — AMOXICILLIN 500 MG PO CAPS: 500.0000 mg | ORAL_CAPSULE | Freq: Two times a day (BID) | ORAL | 0 refills | Status: AC

## 2017-03-03 MED ORDER — DEXTROMETHORPHAN-BENZOCAINE 10-15 MG MT LOZG: LOZENGE | OROMUCOSAL | 0 refills | Status: DC

## 2017-03-03 MED ORDER — METFORMIN HCL 500 MG PO TABS: 500.0000 mg | ORAL_TABLET | Freq: Two times a day (BID) | ORAL | 3 refills | Status: DC

## 2017-03-03 MED FILL — AMOXICILLIN 500 MG CAPSULE: 500 | 7 days supply | Qty: 14 | Fill #0

## 2017-03-03 MED FILL — ACCU-CHEK AVIVA PLUS TEST S: 30 days supply | Qty: 100 | Fill #5

## 2017-03-03 NOTE — Progress Notes (Signed)
Needs RF on medication DM f/u  samir [phone]

## 2017-03-03 NOTE — Progress Notes (Signed)
Makayla Roberts, is a 32 y.o. female  ZOX:[id number]:[id number]  WJX:[id number]  DOB - [birth year]     Subjective:   Makayla Roberts is a 32 y.o. female with history of mental retardation, deafness, blindness, impaired speech, behavior issues and type 2 diabetes mellitus on metforminheretodayfor a follow up visitfor DM and medication refill. She is here with her mom,patient follows up with neurologist for mental retardation and other neurological issues. She is said to have sore throat and she appears to be in pain when she swallows. No fever. Patient is non-verbal so unable to get appropriate history. Per mom, she is adherent with her medications and follow up.   Problem  Sorethroat   ALLERGIES: No Known Allergies  PAST MEDICAL HISTORY: Past Medical History:  Diagnosis Date  . Blind left eye   . Deaf   . Developmental delay   . Diabetes mellitus without complication (HCC)   . Mental retardation     MEDICATIONS AT HOME: Prior to Admission medications   Medication Sig Start Date End Date Taking? Authorizing Provider  ACCU-CHEK AVIVA PLUS test strip USE TO TEST BLOOD SUGAR THREE TIMES DAILY OR AS INSTRUCTED 09/18/16  Yes Ninnie Fein E, MD  fluticasone (FLONASE) 50 MCG/ACT nasal spray Place 2 sprays into both nostrils at bedtime. 03/15/14  Yes Funches, Josalyn, MD  ketotifen (ZADITOR) 0.025 % ophthalmic solution Place 1 drop into both eyes 2 (two) times daily. 08/12/16  Yes Quentin AngstJegede, Kyla Duffy E, MD  Lancets (ACCU-CHEK SOFT TOUCH) lancets Use as instructed 12/27/14  Yes Carl Butner E, MD  metFORMIN (GLUCOPHAGE) 500 MG tablet Take 1 tablet (500 mg total) by mouth 2 (two) times daily with a meal. 03/03/17  Yes Sundra Haddix E, MD  risperiDONE (RISPERDAL) 1 MG tablet Take 1 tablet (1 mg total) by mouth 3 (three) times daily. 05/12/16  Yes Nilda RiggsMartin, Nancy Carolyn, NP  amoxicillin (AMOXIL) 500 MG capsule Take 1 capsule (500 mg total) by mouth 2 (two) times daily for 7 days. 03/03/17 03/10/17   Quentin AngstJegede, Latika Kronick E, MD  Dextromethorphan-Benzocaine 10-15 MG LOZG Take 1 tab Q6H prn 03/03/17   Quentin AngstJegede, Maimuna Leaman E, MD    Objective:   Vitals:   03/03/17 1531 03/03/17 1651  BP: (!) 144/90 120/78  Pulse: (!) 106   Resp: 16   Temp: (!) 97.5 F (36.4 C)   TempSrc: Oral   SpO2: 97%   Weight: 136 lb (61.7 kg)    Exam General appearance : Awake, alert, not in any distress. Not toxic looking, obese, non-verbal HEENT: Atraumatic and Normocephalic, Legally blind Neck: Supple, no JVD. No cervical lymphadenopathy.  Chest: Good air entry bilaterally, no added sounds  CVS: S1 S2 regular, no murmurs.  Abdomen: Bowel sounds present, Non tender and not distended with no gaurding, rigidity or rebound. Extremities: B/L Lower Ext shows no edema, both legs are warm to touch Neurology: Awake alert, and oriented X 3, CN II-XII intact, Non focal Skin: No Rash  Data Review Lab Results  Component Value Date   HGBA1C 7.1 03/03/2017   HGBA1C 6.5 08/12/2016   HGBA1C 6.3 01/23/2016    Assessment & Plan   1. Type 2 diabetes mellitus without complication, without long-term current use of insulin (HCC)  - Glucose (CBG) - HgB A1c - metFORMIN (GLUCOPHAGE) 500 MG tablet; Take 1 tablet (500 mg total) by mouth 2 (two) times daily with a meal.  Dispense: 180 tablet; Refill: 3  2. Developmental disability  - Continue follow up with Neurologist  3. Sorethroat  -  amoxicillin (AMOXIL) 500 MG capsule; Take 1 capsule (500 mg total) by mouth 2 (two) times daily for 7 days.  Dispense: 14 capsule; Refill: 0 - Dextromethorphan-Benzocaine 10-15 MG LOZG; Take 1 tab Q6H prn  Dispense: 20 each; Refill: 0  Patient have been counseled extensively about nutrition and exercise. Other issues discussed during this visit include: low cholesterol diet, weight control and daily exercise, foot care, annual eye examinations at Ophthalmology, importance of adherence with medications and regular follow-up. We also  discussed long term complications of uncontrolled diabetes.   Return in about 3 months (around 06/01/2017) for Follow up HTN, Hemoglobin A1C and Follow up, DM.  The patient was given clear instructions to go to ER or return to medical center if symptoms don't improve, worsen or new problems develop. The patient verbalized understanding. The patient was told to call to get lab results if they haven't heard anything in the next week.   This note has been created with Education officer, environmental. Any transcriptional errors are unintentional.    Jeanann Lewandowsky, MD, MHA, Maxwell Caul, CPE [facility] [place], [place] [phone]   03/03/2017, 4:51 PM

## 2017-03-03 NOTE — Patient Instructions (Signed)

## 2017-03-24 ENCOUNTER — Encounter (HOSPITAL_COMMUNITY): Payer: Self-pay | Admitting: Emergency Medicine

## 2017-03-24 ENCOUNTER — Other Ambulatory Visit: Payer: Self-pay

## 2017-03-24 ENCOUNTER — Emergency Department (HOSPITAL_COMMUNITY): Payer: Medicaid Other

## 2017-03-24 ENCOUNTER — Emergency Department (HOSPITAL_COMMUNITY)
Admission: EM | Admit: 2017-03-24 | Discharge: 2017-03-25 | Disposition: A | Discharge status: Home / Self Care | Payer: Medicaid Other | Attending: Emergency Medicine | Admitting: Emergency Medicine

## 2017-03-24 DIAGNOSIS — Y939 Activity, unspecified: Secondary | ICD-10-CM | POA: Insufficient documentation

## 2017-03-24 DIAGNOSIS — Z79899 Other long term (current) drug therapy: Secondary | ICD-10-CM | POA: Insufficient documentation

## 2017-03-24 DIAGNOSIS — S92354A Nondisplaced fracture of fifth metatarsal bone, right foot, initial encounter for closed fracture: Secondary | ICD-10-CM | POA: Diagnosis not present

## 2017-03-24 DIAGNOSIS — X501XXA Overexertion from prolonged static or awkward postures, initial encounter: Secondary | ICD-10-CM | POA: Diagnosis not present

## 2017-03-24 DIAGNOSIS — S92351A Displaced fracture of fifth metatarsal bone, right foot, initial encounter for closed fracture: Secondary | ICD-10-CM

## 2017-03-24 DIAGNOSIS — Y929 Unspecified place or not applicable: Secondary | ICD-10-CM | POA: Insufficient documentation

## 2017-03-24 DIAGNOSIS — S99911A Unspecified injury of right ankle, initial encounter: Secondary | ICD-10-CM | POA: Diagnosis present

## 2017-03-24 DIAGNOSIS — Y999 Unspecified external cause status: Secondary | ICD-10-CM | POA: Insufficient documentation

## 2017-03-24 DIAGNOSIS — E119 Type 2 diabetes mellitus without complications: Secondary | ICD-10-CM | POA: Diagnosis not present

## 2017-03-24 DIAGNOSIS — Z7984 Long term (current) use of oral hypoglycemic drugs: Secondary | ICD-10-CM | POA: Diagnosis not present

## 2017-03-24 MED ORDER — IBUPROFEN 800 MG PO TABS: 800.0000 mg | ORAL_TABLET | Freq: Three times a day (TID) | ORAL | 0 refills | Status: DC

## 2017-03-24 NOTE — ED Triage Notes (Signed)
Pt family reports that she twisted her R ankle/foot walking at school. Swelling and bruising present. Pt is nonverbal.

## 2017-03-24 NOTE — ED Provider Notes (Signed)
[facility] EMERGENCY DEPARTMENT Provider Note   CSN: [id number] Arrival date & time: 03/24/17  2020     History   Chief Complaint Chief Complaint  Patient presents with  . Ankle Pain    HPI Makayla Roberts is a 32 y.o. female who is non-verbal with a history of MR presenting to the emergency department today for acute onset of right foot pain began today.  History is provided by family members.  Family reports that the patient was walking at school when she stepped off a curb and twisted her right ankle.  She is been complaining of pain along the lateral aspect of the foot since this time.  They note swelling and bruising is present to the lateral aspect of the patient's foot.  They have not given anything for symptoms.  No open wounds.  HPI  Past Medical History:  Diagnosis Date  . Blind left eye   . Deaf   . Developmental delay   . Diabetes mellitus without complication (HCC)   . Mental retardation     Patient Active Problem List   Diagnosis Date Noted  . Sorethroat 03/03/2017  . Itchy eyes 08/12/2016  . Polyphagia 08/12/2016  . Behavior problem, adult 05/12/2016  . School physical exam 06/20/2015  . Type 2 diabetes mellitus without complication, without long-term current use of insulin (HCC) 06/20/2015  . Mental retardation 12/27/2014  . Tachycardia with hypertension 12/27/2014  . Right ear impacted cerumen 09/14/2014  . Upper respiratory infection 09/14/2014  . Sinus tachycardia 09/14/2014  . Type 2 diabetes mellitus without complication (HCC) 08/23/2014  . Redness of eye, left 08/23/2014  . Other specified diabetes mellitus without complications (HCC) 04/19/2014  . Allergic rhinoconjunctivitis of left eye 03/15/2014  . Hypovitaminosis D 03/02/2013  . Unspecified hypothyroidism 03/02/2013  . Essential hypertension, benign 03/02/2013  . Blindness of left eye 01/11/2013  . Impaired speech 01/11/2013  . Developmental disability 01/11/2013    Past  Surgical History:  Procedure Laterality Date  . None      OB History    Gravida Para Term Preterm AB Living   0 0 0 0 0     SAB TAB Ectopic Multiple Live Births   0 0 0           Home Medications    Prior to Admission medications   Medication Sig Start Date End Date Taking? Authorizing Provider  ACCU-CHEK AVIVA PLUS test strip USE TO TEST BLOOD SUGAR THREE TIMES DAILY OR AS INSTRUCTED 09/18/16   Quentin Angst, MD  Dextromethorphan-Benzocaine 10-15 MG LOZG Take 1 tab Q6H prn 03/03/17   Jegede, Phylliss Blakes, MD  fluticasone (FLONASE) 50 MCG/ACT nasal spray Place 2 sprays into both nostrils at bedtime. 03/15/14   Funches, Gerilyn Nestle, MD  ketotifen (ZADITOR) 0.025 % ophthalmic solution Place 1 drop into both eyes 2 (two) times daily. 08/12/16   Quentin Angst, MD  Lancets (ACCU-CHEK SOFT TOUCH) lancets Use as instructed 12/27/14   Quentin Angst, MD  metFORMIN (GLUCOPHAGE) 500 MG tablet Take 1 tablet (500 mg total) by mouth 2 (two) times daily with a meal. 03/03/17   Jegede, Phylliss Blakes, MD  risperiDONE (RISPERDAL) 1 MG tablet Take 1 tablet (1 mg total) by mouth 3 (three) times daily. 05/12/16   Nilda Riggs, NP    Family History Family History  Problem Relation Age of Onset  . Hypertension Mother   . Arthritis Mother   . Heart disease Mother  Social History Social History   Tobacco Use  . Smoking status: Never Smoker  . Smokeless tobacco: Never Used  Substance Use Topics  . Alcohol use: No  . Drug use: No     Allergies   Morphine and related   Review of Systems Review of Systems Level 5 caveat  Physical Exam Updated Vital Signs BP 111/71   Pulse 95   Temp 98.5 F (36.9 C) (Oral)   Resp 17   Ht 5\' 1"  (1.549 m)   Wt 61.7 kg (136 lb)   SpO2 99%   BMI 25.70 kg/m   Physical Exam  Constitutional: She appears well-developed and well-nourished.  HENT:  Head: Normocephalic and atraumatic.  Right Ear: External ear normal.  Left Ear:  External ear normal.  Eyes: Conjunctivae are normal. Right eye exhibits no discharge. Left eye exhibits no discharge. No scleral icterus.  Cardiovascular:  Pulses:      Dorsalis pedis pulses are 2+ on the right side.       Posterior tibial pulses are 2+ on the right side.  Pulmonary/Chest: Effort normal. No respiratory distress.  Musculoskeletal:       Feet:  Swelling to the right foot.  Bruising is indicated in diagram.  Neurological: She is alert.  Recent withdrawals to light touch of the right foot.  Skin: Skin is warm, dry and intact. Capillary refill takes less than 2 seconds. No pallor.  Psychiatric: She has a normal mood and affect.  Nursing note and vitals reviewed.    ED Treatments / Results  Labs (all labs ordered are listed, but only abnormal results are displayed) Labs Reviewed - No data to display  EKG  EKG Interpretation None       Radiology Dg Ankle Complete Right  Result Date: 03/24/2017 CLINICAL DATA:  Acute onset of right ankle and foot swelling, after rolling right foot. Initial encounter. EXAM: RIGHT ANKLE - COMPLETE 3+ VIEW COMPARISON:  None. FINDINGS: There is a minimally displaced avulsion fracture through the base of the fifth metatarsal. The ankle mortise is intact; the interosseous space is within normal limits. No talar tilt or subluxation is seen. The joint spaces are preserved. No significant soft tissue abnormalities are seen. IMPRESSION: Minimally displaced avulsion fracture through the base of the fifth metatarsal. Electronically Signed   By: Roanna RaiderJeffery  Chang M.D.   On: 03/24/2017 22:53   Dg Foot Complete Right  Result Date: 03/24/2017 CLINICAL DATA:  Acute onset of right ankle and foot swelling, after rolling right foot. Initial encounter. EXAM: RIGHT FOOT COMPLETE - 3+ VIEW COMPARISON:  None. FINDINGS: There is a minimally displaced avulsion fracture through the base of the fifth metatarsal. No additional fractures are seen. Visualized joint spaces  are otherwise grossly preserved. The subtalar joint is unremarkable. Mild soft tissue swelling is suggested anterior to the ankle. IMPRESSION: Minimally displaced avulsion fracture through the base of the fifth metatarsal. Electronically Signed   By: Roanna RaiderJeffery  Chang M.D.   On: 03/24/2017 22:54    Procedures Procedures (including critical care time)  Medications Ordered in ED Medications - No data to display   Initial Impression / Assessment and Plan / ED Course  I have reviewed the triage vital signs and the nursing notes.  Pertinent labs & imaging results that were available during my care of the patient were reviewed by me and considered in my medical decision making (see chart for details).     32 y.o. nonverbal female with pain along the base of her  fifth MTP and lateral ankle per family after rolling her ankle stepping down from a curb this afternoon.  The patient is neurovascularly intact on exam.  No open wounds.  There is noted to be avulsion fracture at the base of the fifth metatarsal.  No Jones fracture.  Most likely ankle sprain as well.  Patient is unable to tolerate crutches.  Will place in cam walker boot.  Advised conservative therapy, Price therapy and close follow-up with orthopedics this week. Specific return precautions discussed. Time was given for all questions to be answered. The patient verbalized understanding and agreement with plan. The patient appears safe for discharge home.  Final Clinical Impressions(s) / ED Diagnoses   Final diagnoses:  Closed fracture of base of fifth metatarsal bone of right foot, initial encounter       Princella Pellegrini 03/24/17 2341    Jacalyn Lefevre, MD 03/24/17 2355

## 2017-03-24 NOTE — ED Notes (Signed)
Pt does not speak  She has a rt foot and ankle swollen.  Fall on a uneven concrete surface

## 2017-03-24 NOTE — Discharge Instructions (Signed)
Please read and follow all provided instructions.  You have been seen today for right foot pain  Tests performed today include: An x-ray of the affected area - shows avulsion fracture at the base of the 5th MTP Vital signs. See below for your results today.   Home care instructions: -- *PRICE in the first 24-48 hours after injury: Protect - use walking boot Rest Ice- Do not apply ice pack directly to your skin, place towel or similar between your skin and ice/ice pack. Apply ice for 20 min, then remove for 40 min while awake Compression- Wear brace, elastic bandage, splint as directed by your provider Elevate affected extremity above the level of your heart when not walking around for the first 24-48 hours   For pain control you may take: 800mg  of ibuprofen (that is usually four 200mg  over the counter pills) up to 3 times a day (please take with food) and acetaminophen 975mg  (this is 3 normal strength, 325mg , over the counter pills) up to four times a day. Please do not take more than this. Do not drink alcohol or combine with other medications that have acetaminophen or Ibuprofen as an ingredient (Read the labels!).   Follow-up instructions: Please follow-up with orthopedic physician (bone specialist) this week  Return instructions:  Please return if your toes or feet are numb or tingling, appear gray or blue, or you have severe pain (also elevate the leg and loosen splint or wrap if you were given one) Please return to the Emergency Department if you experience worsening symptoms.  Please return if you have any other emergent concerns. Additional Information:  Your vital signs today were: BP 111/71    Pulse 95    Temp 98.5 F (36.9 C) (Oral)    Resp 17    Ht 5\' 1"  (1.549 m)    Wt 61.7 kg (136 lb)    SpO2 99%    BMI 25.70 kg/m  If your blood pressure (BP) was elevated above 135/85 this visit, please have this repeated by your doctor within one month. ---------------

## 2017-03-29 MED FILL — metFORMIN HCL 500 MG TABS: 500 | 30 days supply | Qty: 60 | Fill #0

## 2017-03-29 MED FILL — PATADAY 0.2% EYE DROPS: 0.2 | 25 days supply | Qty: 3 | Fill #6

## 2017-03-29 MED FILL — ACCU-CHEK AVIVA PLUS TEST S: 30 days supply | Qty: 100 | Fill #6

## 2017-03-29 MED FILL — risperiDONE 1 MG TABS: 1 | 30 days supply | Qty: 90 | Fill #9

## 2017-04-29 MED FILL — metFORMIN HCL 500 MG TABS: 500 | 30 days supply | Qty: 60 | Fill #1

## 2017-04-29 MED FILL — ACCU-CHEK AVIVA PLUS TEST S: 30 days supply | Qty: 100 | Fill #7

## 2017-04-29 MED FILL — PATADAY 0.2% EYE DROPS: 0.2 | 25 days supply | Qty: 3 | Fill #7

## 2017-05-13 NOTE — Progress Notes (Signed)
[facility]  PATIENT: Makayla Roberts DOB: June 06, 1985   REASON FOR VISIT: Mental retardation, behavior changes, developmental disability, blindness in the left eye HISTORY FROM: Mother and interpreter    HISTORY OF PRESENT ILLNESS:Makayla Roberts is a 32 year old female with a history of mental retardation, impaired speech and behavioral issues. The patient returns today for followup and interpreter is present. She is currently off Risperdal as she ran out and was told by the pharmacy that no refills remained.  Since she has been off of the medication she has had more behavior issues hitting the mom jumping around and hitting herself and these are happening daily.  She has been well controlled on Risperdal in the past.  Mom also reports that blood sugars range from 150-170 and her appetite is good.  She still goes to daycare 5 days a week . She also has seasonal allergies. She has been unable to get a CT of the head in the past due to not being cooperative even with medication. She returns for reevaluation HISTORY 03/14/13 Makayla Roberts):: immigrated from [place] 3 months ago, accompanied by her mother, interpreter, and a Child psychotherapist at today's clinical visit  Mother had home delivery at [place] 29 years ago, it was a prolonged difficult delivery, she was blue and small when she was born, she was delayed in reaching her developmental milestones, began to walk at 32 years old, when she was 32 years old, physician at [place] told her mother that part of her brain was missing  she never went to school, developmentally delayed, was not able to speak, tends to have behavior issues  Since she moved to [place] 3 months ago, living in apartment with her mother, she no longer have other relatives to help taking care of her,  Mother felt overwhelming, patient has gained a lot of weight also become agitated easily, she eats everything that she can get her hands on,  Sometimes she is out of control, began  developing hair out of her head, also be violent towards her mother, she has difficulty sleeping,  There was no history of seizure, mother has 3 other children, that is all healthy    REVIEW OF SYSTEMS: Full 14 system review of systems performed and notable only for those listed, all others are neg:  Constitutional: neg  Cardiovascular: neg Ear/Nose/Throat: neg  Skin: neg Eyes: Blind in the left eye Respiratory: neg Gastroitestinal: neg  Hematology/Lymphatic: neg  Endocrine: neg Musculoskeletal:neg Allergy/Immunology: Seasonal Neurological: neg Psychiatric:  behavior issues Sleep : neg   ALLERGIES: Allergies  Allergen Reactions  . Morphine And Related Shortness Of Breath    HOME MEDICATIONS: Outpatient Medications Prior to Visit  Medication Sig Dispense Refill  . ACCU-CHEK AVIVA PLUS test strip USE TO TEST BLOOD SUGAR THREE TIMES DAILY OR AS INSTRUCTED 100 each 12  . ketotifen (ZADITOR) 0.025 % ophthalmic solution Place 1 drop into both eyes 2 (two) times daily. 5 mL 1  . Lancets (ACCU-CHEK SOFT TOUCH) lancets Use as instructed 100 each 12  . metFORMIN (GLUCOPHAGE) 500 MG tablet Take 1 tablet (500 mg total) by mouth 2 (two) times daily with a meal. 180 tablet 3  . risperiDONE (RISPERDAL) 1 MG tablet Take 1 tablet (1 mg total) by mouth 3 (three) times daily. (Patient not taking: Reported on 05/17/2017) 90 tablet 11  . Dextromethorphan-Benzocaine 10-15 MG LOZG Take 1 tab Q6H prn 20 each 0  . fluticasone (FLONASE) 50 MCG/ACT nasal spray Place 2 sprays into both nostrils  at bedtime. 16 g 6  . ibuprofen (ADVIL,MOTRIN) 800 MG tablet Take 1 tablet (800 mg total) by mouth 3 (three) times daily. 21 tablet 0   No facility-administered medications prior to visit.     PAST MEDICAL HISTORY: Past Medical History:  Diagnosis Date  . Blind left eye   . Deaf   . Developmental delay   . Diabetes mellitus without complication (HCC)   . Mental retardation     PAST SURGICAL  HISTORY: Past Surgical History:  Procedure Laterality Date  . None      FAMILY HISTORY: Family History  Problem Relation Age of Onset  . Hypertension Mother   . Arthritis Mother   . Heart disease Mother     SOCIAL HISTORY: Social History   Socioeconomic History  . Marital status: Single    Spouse name: Not on file  . Number of children: Not on file  . Years of education: Not on file  . Highest education level: Not on file  Occupational History  . Not on file  Social Needs  . Financial resource strain: Not on file  . Food insecurity:    Worry: Not on file    Inability: Not on file  . Transportation needs:    Medical: Not on file    Non-medical: Not on file  Tobacco Use  . Smoking status: Never Smoker  . Smokeless tobacco: Never Used  Substance and Sexual Activity  . Alcohol use: No  . Drug use: No  . Sexual activity: Never  Lifestyle  . Physical activity:    Days per week: Not on file    Minutes per session: Not on file  . Stress: Not on file  Relationships  . Social connections:    Talks on phone: Not on file    Gets together: Not on file    Attends religious service: Not on file    Active member of club or organization: Not on file    Attends meetings of clubs or organizations: Not on file    Relationship status: Not on file  . Intimate partner violence:    Fear of current or ex partner: Not on file    Emotionally abused: Not on file    Physically abused: Not on file    Forced sexual activity: Not on file  Other Topics Concern  . Not on file  Social History Narrative   ** Merged History Encounter **       Patient lives at home with her mom (Makayla Roberts). Education- None Left handed. Caffeine- Some hot tea  Patient Special Coordinator Evon Slack- Margaret Evans - [phone], [phone] and fax [phone].     PHYSICAL EXAM  Vitals:   05/12/16 0746  BP: (!) 132/92  Pulse: (!) 127  Weight: 137 lb (62.1 kg)  Height: 4\' 11"  (1.499 m)   Body mass  index is 27.67 kg/m. Generalized: Well developed, Mildly obese female in no acute distress   Neurological examination  Mentation: Nonverbal, unable to follow directions.  Cranial nerve II-XII: Left cornea opaque, right pupil irregular but reactive to light. . Extraocular movements were full. Facial symmetry present. Motor: The motor testing reveals 5 over 5 strength of all 4 extremities. Good symmetric motor tone is noted throughout.  Sensory: could not assess properly withdraws to pain Coordination: could not assess Gait and station: ambulates independently and without difficulty. Mother at her side Reflexes: Deep tendon reflexes are symmetric and normal bilaterally.    DIAGNOSTIC DATA (  LABS, IMAGING, TESTING) - I reviewed patient records, labs, notes, testing and imaging myself where available.  Lab Results  Component Value Date   WBC 7.3 01/23/2016   HGB 14.3 01/23/2016   HCT 41.7 01/23/2016   MCV 89.5 01/23/2016   PLT 264 01/23/2016      Component Value Date/Time   NA 141 01/23/2016 1642   K 4.2 01/23/2016 1642   CL 104 01/23/2016 1642   CO2 26 01/23/2016 1642   GLUCOSE 180 (H) 01/23/2016 1642   BUN 12 01/23/2016 1642   CREATININE 0.83 01/23/2016 1642   CALCIUM 9.9 01/23/2016 1642   PROT 7.3 01/23/2016 1642   ALBUMIN 4.3 01/23/2016 1642   AST 12 01/23/2016 1642   ALT 12 01/23/2016 1642   ALKPHOS 58 01/23/2016 1642   BILITOT 0.3 01/23/2016 1642   GFRNONAA >89 01/23/2016 1642   GFRAA >89 01/23/2016 1642    Lab Results  Component Value Date   HGBA1C 7.1 03/03/2017       ASSESSMENT AND PLAN 32 y.o. year old female has a past medical history of Blind left eye; Mental retardation; Diabetes mellitus without complication; and Deaf.  here to follow-up for 1. Mental retardation with developmental delays 2. Behavior issues  Restart  Risperdal at current dose will refill do not run out of medication F/U yearly and prn I spent 20 min in total face to face time  with the patient , mom and intrepretor more than 50% of which was spent counseling and coordination of care, reviewing recent lab results reviewing medications and discussing and reviewing the diagnosis of her mental retardation with developmental delays with behavior issues.Interpreter was used to communicate directly with the patient and mother for the entire encounter including providing detailed instructions. She was given instructions if her behavior issues worsen.  Nilda Riggs, [facility], [facility], APRN  [facility] [address], [address] [place], [place] [address] [phone]

## 2017-05-17 ENCOUNTER — Encounter: Payer: Self-pay | Admitting: Nurse Practitioner

## 2017-05-17 ENCOUNTER — Ambulatory Visit: Payer: Medicaid Other | Admitting: Nurse Practitioner

## 2017-05-17 VITALS — BP 118/78 | HR 111 | Ht 59.0 in | Wt 137.0 lb

## 2017-05-17 DIAGNOSIS — H5440 Blindness, one eye, unspecified eye: Secondary | ICD-10-CM | POA: Diagnosis not present

## 2017-05-17 DIAGNOSIS — F89 Unspecified disorder of psychological development: Secondary | ICD-10-CM | POA: Diagnosis not present

## 2017-05-17 DIAGNOSIS — F79 Unspecified intellectual disabilities: Secondary | ICD-10-CM | POA: Diagnosis not present

## 2017-05-17 DIAGNOSIS — F69 Unspecified disorder of adult personality and behavior: Secondary | ICD-10-CM

## 2017-05-17 MED ORDER — RISPERIDONE 1 MG PO TABS
1.0000 mg | ORAL_TABLET | Freq: Three times a day (TID) | ORAL | 11 refills | Status: DC
Start: 2017-05-17 — End: 2018-05-31

## 2017-05-17 NOTE — Progress Notes (Signed)
I have reviewed and agreed above plan. 

## 2017-05-17 NOTE — Patient Instructions (Signed)
Continue Risperdal at current dose will refill F/U yearly and prn

## 2017-05-20 ENCOUNTER — Telehealth: Payer: Self-pay | Admitting: Nurse Practitioner

## 2017-05-20 MED FILL — PATADAY 0.2% EYE DROPS: 0.2 | 25 days supply | Qty: 3 | Fill #8

## 2017-05-20 NOTE — Telephone Encounter (Signed)
Pt called stating the pharmacy needing a PA for risperiDONE (RISPERDAL) 1 MG tablet

## 2017-05-21 NOTE — Telephone Encounter (Addendum)
[employer] [employer] called, spoke to [place], [id number], approval for risperidone 1 mg tabs (she takes 1 tablet po TID  #90).  V#[id number]#[id number].   Until 05-16-2018.  I LMVM for her [facility], which was closed for lunch and also LMVM for pt' home to let them know as well.

## 2017-05-25 MED FILL — risperiDONE 1 MG TABS: 1 | 30 days supply | Qty: 90 | Fill #0

## 2017-06-01 MED FILL — ACCU-CHEK AVIVA PLUS TEST S: 30 days supply | Qty: 100 | Fill #8

## 2017-06-01 MED FILL — metFORMIN HCL 500 MG TABS: 500 | 30 days supply | Qty: 60 | Fill #2

## 2017-06-29 MED FILL — ACCU-CHEK AVIVA PLUS TEST S: 30 days supply | Qty: 100 | Fill #9

## 2017-06-29 MED FILL — metFORMIN HCL 500 MG TABS: 500 | 30 days supply | Qty: 60 | Fill #3

## 2017-06-29 MED FILL — PATADAY 0.2% EYE DROPS: 0.2 | 25 days supply | Qty: 3 | Fill #9

## 2017-06-29 MED FILL — risperiDONE 1 MG TABS: 1 | 30 days supply | Qty: 90 | Fill #1

## 2017-07-14 ENCOUNTER — Ambulatory Visit: Payer: Medicaid Other | Attending: Family Medicine | Admitting: Physician Assistant

## 2017-07-14 VITALS — BP 133/85 | HR 118 | Temp 98.2°F | Resp 16 | Ht 59.0 in | Wt 135.8 lb

## 2017-07-14 DIAGNOSIS — H5462 Unqualified visual loss, left eye, normal vision right eye: Secondary | ICD-10-CM | POA: Diagnosis not present

## 2017-07-14 DIAGNOSIS — T148XXA Other injury of unspecified body region, initial encounter: Secondary | ICD-10-CM

## 2017-07-14 DIAGNOSIS — Z7984 Long term (current) use of oral hypoglycemic drugs: Secondary | ICD-10-CM | POA: Insufficient documentation

## 2017-07-14 DIAGNOSIS — Z79899 Other long term (current) drug therapy: Secondary | ICD-10-CM | POA: Diagnosis not present

## 2017-07-14 DIAGNOSIS — H9190 Unspecified hearing loss, unspecified ear: Secondary | ICD-10-CM | POA: Diagnosis not present

## 2017-07-14 DIAGNOSIS — Z885 Allergy status to narcotic agent status: Secondary | ICD-10-CM | POA: Diagnosis not present

## 2017-07-14 DIAGNOSIS — E119 Type 2 diabetes mellitus without complications: Secondary | ICD-10-CM | POA: Diagnosis present

## 2017-07-14 DIAGNOSIS — F79 Unspecified intellectual disabilities: Secondary | ICD-10-CM | POA: Insufficient documentation

## 2017-07-14 DIAGNOSIS — L299 Pruritus, unspecified: Secondary | ICD-10-CM | POA: Diagnosis not present

## 2017-07-14 LAB — GLUCOSE, POCT (MANUAL RESULT ENTRY): POC Glucose: 164 mg/dl — AB (ref 70–99)

## 2017-07-14 MED ORDER — TRIAMCINOLONE ACETONIDE 0.1 % EX CREA: 1.0000 "application " | TOPICAL_CREAM | Freq: Two times a day (BID) | CUTANEOUS | 0 refills | Status: DC

## 2017-07-14 MED ORDER — CETIRIZINE HCL 10 MG PO TABS: 10.0000 mg | ORAL_TABLET | Freq: Every day | ORAL | 11 refills | Status: DC

## 2017-07-14 MED FILL — TRIAMCINOLONE ACETONIDE 0.1: 0.1 | 15 days supply | Qty: 30 | Fill #0

## 2017-07-14 MED FILL — CETIRIZINE HCL 10 MG TABS: 10 | 30 days supply | Qty: 30 | Fill #0

## 2017-07-14 NOTE — Progress Notes (Signed)
Patient ID: Makayla Roberts, female   DOB: Jul 23, 1985, 32 y.o.   MRN: [id number]    Makayla Roberts, is a 32 y.o. female  WGN:[id number]  HQI:[id number]  DOB - [birth year]  Subjective:  Chief Complaint and HPI: Makayla Roberts is a 32 y.o. female here today with probable mosquito bites.  They noticed a mosquito in the house and within a couple of hours the patient was noticed to be scratching at various areas.  This started about 1 week ago.  On hands, ankles, and L upper arm, and L face. No one else in home affected.    "Muhammad" with [employer].  Mom is here with her.    ROS:   Constitutional:  No f/c, No night sweats, No unexplained weight loss. EENT:  No vision changes, No blurry vision, No hearing changes. No mouth, throat, or ear problems.  Respiratory: No cough, No SOB Cardiac: No CP, no palpitations GI:  No abd pain, No N/V/D. GU: No Urinary s/sx Musculoskeletal: No joint pain Neuro: No headache, no dizziness, no motor weakness.  Skin: No rash, +itching/bites Endocrine:  No polydipsia. No polyuria.  Psych: Denies SI/HI  No problems updated.  ALLERGIES: Allergies  Allergen Reactions  . Morphine And Related Shortness Of Breath    PAST MEDICAL HISTORY: Past Medical History:  Diagnosis Date  . Blind left eye   . Deaf   . Developmental delay   . Diabetes mellitus without complication (HCC)   . Mental retardation     MEDICATIONS AT HOME: Prior to Admission medications   Medication Sig Start Date End Date Taking? Authorizing Provider  ACCU-CHEK AVIVA PLUS test strip USE TO TEST BLOOD SUGAR THREE TIMES DAILY OR AS INSTRUCTED 09/18/16  Yes Jegede, Olugbemiga E, MD  ketotifen (ZADITOR) 0.025 % ophthalmic solution Place 1 drop into both eyes 2 (two) times daily. 08/12/16  Yes Quentin Angst, MD  Lancets (ACCU-CHEK SOFT TOUCH) lancets Use as instructed 12/27/14  Yes Jegede, Olugbemiga E, MD  metFORMIN (GLUCOPHAGE) 500 MG tablet Take 1 tablet (500 mg total) by mouth 2  (two) times daily with a meal. 03/03/17  Yes Jegede, Olugbemiga E, MD  risperiDONE (RISPERDAL) 1 MG tablet Take 1 tablet (1 mg total) by mouth 3 (three) times daily. 05/17/17  Yes Nilda Riggs, NP  cetirizine (ZYRTEC) 10 MG tablet Take 1 tablet (10 mg total) by mouth daily. Prn itchin 07/14/17   Anders Simmonds, PA-C  triamcinolone cream (KENALOG) 0.1 % Apply 1 application topically 2 (two) times daily. 07/14/17   Anders Simmonds, PA-C     Objective:  EXAM:   Vitals:   07/14/17 1124  BP: 133/85  Pulse: (!) 118  Resp: 16  Temp: 98.2 F (36.8 C)  TempSrc: Oral  SpO2: 98%  Weight: 135 lb 12.8 oz (61.6 kg)  Height:  (1.499 m)    General appearance : A&OX3. NAD. Non-toxic-appearing HEENT: Atraumatic and Normocephalic.  PERRLA. EOM intact.  Neck: supple, no JVD. No cervical lymphadenopathy. No thyromegaly Chest/Lungs:  Breathing-non-labored, Good air entry bilaterally, breath sounds normal without rales, rhonchi, or wheezing  CVS: S1 S2 regular, no murmurs, gallops, rubs  Extremities: Bilateral Lower Ext shows no edema, both legs are warm to touch with = pulse throughout Neurology:  CN II-XII grossly intact, Non focal.   Skin:  No Rash, 1-3 excoriated lesions on B hands, R lower leg near the ankle, L face and L upper arm.  No secondary infection.    Data Review Lab  Results  Component Value Date   HGBA1C 7.1 03/03/2017   HGBA1C 6.5 08/12/2016   HGBA1C 6.3 01/23/2016     Assessment & Plan   1. Type 2 diabetes mellitus without complication, without long-term current use of insulin (HCC) Not at goal.  Eliminate sugar intake/work on diet.  Continue current regimen - Glucose (CBG)  2. Bites Likely mosquito bites - cetirizine (ZYRTEC) 10 MG tablet; Take 1 tablet (10 mg total) by mouth daily. Prn itchin  Dispense: 30 tablet; Refill: 11 - triamcinolone cream (KENALOG) 0.1 %; Apply 1 application topically 2 (two) times daily.  Dispense: 30 g; Refill: 0   Patient  have been counseled extensively about nutrition and exercise  Return in about 1 month (around 08/14/2017) for assign new PCP; f/up DM.  The patient was given clear instructions to go to ER or return to medical center if symptoms don't improve, worsen or new problems develop. The patient verbalized understanding. The patient was told to call to get lab results if they haven't heard anything in the next week.     Georgian Co, PA-C [facility] and [facility] [place], [place] [phone]   07/14/2017, 11:42 AM

## 2017-07-14 NOTE — Progress Notes (Signed)
Pt. Is here regarding about a mosquito bites like on her hands and face. Pt's mom stated it has been going for a week and stated its itchy.

## 2017-07-29 MED FILL — PATADAY 0.2% EYE DROPS: 0.2 | 25 days supply | Qty: 3 | Fill #10

## 2017-07-29 MED FILL — risperiDONE 1 MG TABS: 1 | 30 days supply | Qty: 90 | Fill #2

## 2017-07-29 MED FILL — metFORMIN HCL 500 MG TABS: 500 | 30 days supply | Qty: 60 | Fill #4

## 2017-07-29 MED FILL — ACCU-CHEK AVIVA PLUS TEST S: 30 days supply | Qty: 100 | Fill #10

## 2017-08-10 ENCOUNTER — Telehealth: Payer: Self-pay | Admitting: Nurse Practitioner

## 2017-08-10 NOTE — Telephone Encounter (Signed)
[facility] [phone] called the patient is applying for citizenship and needs to be evaluated by her Dr. Hendricks LimesPhone rep asked if that should be done thru PCP, she said no neurologist. Is that correct? Does she see NP (last provider seen) or Dr?

## 2017-08-10 NOTE — Telephone Encounter (Signed)
I have never done this

## 2017-08-11 NOTE — Telephone Encounter (Signed)
[facility] returning her call.  She stated that the lawyer stated that the neurologist was needed to fill out the paperwork due to her mental status.  I relayed that her pcp (who has seen her since she has been here since she has lived in [place] would be the best one to fill out.  She would contact them and then if questions will call us back.

## 2017-08-11 NOTE — Telephone Encounter (Signed)
I called and LMVM for [facility], [facility] to return call.

## 2017-08-16 ENCOUNTER — Ambulatory Visit: Payer: Medicaid Other | Attending: Nurse Practitioner | Admitting: Nurse Practitioner

## 2017-08-16 ENCOUNTER — Encounter: Payer: Self-pay | Admitting: Nurse Practitioner

## 2017-08-16 VITALS — BP 125/96 | HR 118 | Temp 97.8°F | Ht 59.0 in | Wt 136.8 lb

## 2017-08-16 DIAGNOSIS — F79 Unspecified intellectual disabilities: Secondary | ICD-10-CM | POA: Insufficient documentation

## 2017-08-16 DIAGNOSIS — H5462 Unqualified visual loss, left eye, normal vision right eye: Secondary | ICD-10-CM | POA: Insufficient documentation

## 2017-08-16 DIAGNOSIS — Z8249 Family history of ischemic heart disease and other diseases of the circulatory system: Secondary | ICD-10-CM | POA: Diagnosis not present

## 2017-08-16 DIAGNOSIS — R21 Rash and other nonspecific skin eruption: Secondary | ICD-10-CM | POA: Diagnosis not present

## 2017-08-16 DIAGNOSIS — Z8261 Family history of arthritis: Secondary | ICD-10-CM | POA: Insufficient documentation

## 2017-08-16 DIAGNOSIS — Z833 Family history of diabetes mellitus: Secondary | ICD-10-CM | POA: Diagnosis not present

## 2017-08-16 DIAGNOSIS — Z79899 Other long term (current) drug therapy: Secondary | ICD-10-CM | POA: Diagnosis not present

## 2017-08-16 DIAGNOSIS — Z7984 Long term (current) use of oral hypoglycemic drugs: Secondary | ICD-10-CM | POA: Diagnosis not present

## 2017-08-16 DIAGNOSIS — Z885 Allergy status to narcotic agent status: Secondary | ICD-10-CM | POA: Diagnosis not present

## 2017-08-16 DIAGNOSIS — H9190 Unspecified hearing loss, unspecified ear: Secondary | ICD-10-CM | POA: Insufficient documentation

## 2017-08-16 DIAGNOSIS — E119 Type 2 diabetes mellitus without complications: Secondary | ICD-10-CM | POA: Diagnosis not present

## 2017-08-16 LAB — POCT GLYCOSYLATED HEMOGLOBIN (HGB A1C): Hemoglobin A1C: 6.6 % — AB (ref 4.0–5.6)

## 2017-08-16 LAB — GLUCOSE, POCT (MANUAL RESULT ENTRY): POC Glucose: 137 mg/dl — AB (ref 70–99)

## 2017-08-16 MED ORDER — HYDROCORTISONE 1 % EX LOTN: 1.0000 "application " | TOPICAL_LOTION | Freq: Two times a day (BID) | CUTANEOUS | 1 refills | Status: DC

## 2017-08-16 MED ORDER — GLUCOSE BLOOD VI STRP: ORAL_STRIP | 12 refills | Status: DC

## 2017-08-16 MED ORDER — ACCU-CHEK SOFT TOUCH LANCETS MISC: 12 refills | Status: DC

## 2017-08-16 MED ORDER — ACCU-CHEK AVIVA PLUS W/DEVICE KIT: PACK | 0 refills | Status: DC

## 2017-08-16 NOTE — Progress Notes (Signed)
Assessment & Plan:  Makayla Roberts was seen today for establish care.  Diagnoses and all orders for this visit:  Type 2 diabetes mellitus without complication, without long-term current use of insulin (HCC) -     Glucose (CBG) -     HgB A1c -     glucose blood (ACCU-CHEK AVIVA PLUS) test strip; USE TO TEST BLOOD SUGAR  AS INSTRUCTED -     Lancets (ACCU-CHEK SOFT TOUCH) lancets; Use as instructed -     Blood Glucose Monitoring Suppl (ACCU-CHEK AVIVA PLUS) w/Device KIT; Use as instructed -     CBC -     CMP14+EGFR -     Lipid panel Continue blood sugar control as discussed in office today, low carbohydrate diet, and regular physical exercise as tolerated, 150 minutes per week (30 min each day, 5 days per week, or 50 min 3 days per week). Keep blood sugar logs with fasting goal of 80-130 mg/dl, post prandial less than 180.  For Hypoglycemia: BS <60 and Hyperglycemia BS >400; contact the clinic ASAP. Annual eye exams and foot exams are recommended.   Skin rash -     hydrocortisone 1 % lotion; Apply 1 application topically 2 (two) times daily.    Patient has been counseled on age-appropriate routine health concerns for screening and prevention. These are reviewed and up-to-date. Referrals have been placed accordingly. Immunizations are up-to-date or declined.    Subjective:   Chief Complaint  Patient presents with  . Establish Care    Pt. is here to establish care for diabetes. Pt's mom stated the Metformin is not working for her.    HPI Makayla Roberts 32 y.o. female presents to office today to establish care. She has a history of DM, Mental retardation and Deafness with dysphasia. A family friend is interpreting for her today and. her mother is also present.  She takes Risperdal '1mg'$  TID for agitation and behavioral issues. Review of Neurology notes dated 05-17-2017 state the following: immigrated from [place] 3 months ago, accompanied by her mother, interpreter, and a Education officer, museum at today's  clinical visit  Mother had home delivery at [place] 29 years ago, it was a prolonged difficult delivery, she was blue and small when she was born, she was delayed in reaching her developmental milestones, began to walk at 32 years old, when she was 32 years old, physician at [place] told her mother that part of her brain was missing  she never went to school, developmentally delayed, was not able to speak, tends to have behavior issues  Since she moved to [place] 3 months ago, living in apartment with her mother, she no longer have other relatives to help taking care of her,  Mother felt overwhelming, patient has gained a lot of weight also become agitated easily, she eats everything that she can get her hands on,  Sometimes she is out of control, began developing hair out of her head, also be violent towards her mother, she has difficulty sleeping,  There was no history of seizure, mother has 3 other children, that is all healthy     DM Chronic and well controlled. A1c down form 7.1 to 6.6. Her glucose level is 137 today as well. Taking metformin 500 mg BID as prescribed. Her mother denies any hypo or hyperglycemic symptoms. Blood sugar averages 130-150s.  Lab Results  Component Value Date   HGBA1C 6.6 (A) 08/16/2017   Review of Systems  Constitutional: Negative for fever, malaise/fatigue and weight loss.  Eyes: Positive for blurred vision. Negative for double vision and photophobia.       Left eye blindness  Respiratory: Negative.  Negative for cough and shortness of breath.   Cardiovascular: Negative.  Negative for chest pain, palpitations and leg swelling.  Gastrointestinal: Negative.  Negative for heartburn, nausea and vomiting.  Musculoskeletal: Negative.  Negative for myalgias.  Neurological: Positive for speech change. Negative for dizziness, focal weakness, seizures and headaches.       SEE HPI  Psychiatric/Behavioral: Negative for suicidal ideas.       SEE HPI    Past  Medical History:  Diagnosis Date  . Blind left eye   . Deaf   . Developmental delay   . Diabetes mellitus without complication ([place])   . Mental retardation     Past Surgical History:  Procedure Laterality Date  . None      Family History  Problem Relation Age of Onset  . Hypertension Mother   . Arthritis Mother   . Heart disease Mother   . Diabetes Father   . Hypertension Father     Social History Reviewed with no changes to be made today.   Outpatient Medications Prior to Visit  Medication Sig Dispense Refill  . cetirizine (ZYRTEC) 10 MG tablet Take 1 tablet (10 mg total) by mouth daily. Prn itchin 30 tablet 11  . ketotifen (ZADITOR) 0.025 % ophthalmic solution Place 1 drop into both eyes 2 (two) times daily. 5 mL 1  . metFORMIN (GLUCOPHAGE) 500 MG tablet Take 1 tablet (500 mg total) by mouth 2 (two) times daily with a meal. 180 tablet 3  . risperiDONE (RISPERDAL) 1 MG tablet Take 1 tablet (1 mg total) by mouth 3 (three) times daily. 90 tablet 11  . ACCU-CHEK AVIVA PLUS test strip USE TO TEST BLOOD SUGAR THREE TIMES DAILY OR AS INSTRUCTED 100 each 12  . Lancets (ACCU-CHEK SOFT TOUCH) lancets Use as instructed 100 each 12  . triamcinolone cream (KENALOG) 0.1 % Apply 1 application topically 2 (two) times daily. 30 g 0   No facility-administered medications prior to visit.     Allergies  Allergen Reactions  . Morphine And Related Shortness Of Breath       Objective:    BP (!) 125/96 (BP Location: Right Arm, Patient Position: Sitting, Cuff Size: Normal)   Pulse (!) 118   Temp 97.8 F (36.6 C) (Oral)   Ht '4\' 11"'$  (1.499 m)   Wt 136 lb 12.8 oz (62.1 kg)   SpO2 97%   BMI 27.63 kg/m  Wt Readings from Last 3 Encounters:  08/16/17 136 lb 12.8 oz (62.1 kg)  07/14/17 135 lb 12.8 oz (61.6 kg)  05/17/17 137 lb (62.1 kg)    Physical Exam  Constitutional: She appears well-developed and well-nourished. She is cooperative.  HENT:  Head: Normocephalic and atraumatic.    Neck: Normal range of motion.  Cardiovascular: Regular rhythm and normal heart sounds. Tachycardia present. Exam reveals no gallop and no friction rub.  No murmur heard. Pulmonary/Chest: Effort normal and breath sounds normal. No tachypnea. No respiratory distress. She has no decreased breath sounds. She has no wheezes. She has no rhonchi. She has no rales. She exhibits no tenderness.  Abdominal: Bowel sounds are normal.  Musculoskeletal: She exhibits no edema or tenderness.  Neurological: Coordination normal.  Nonverbal   Skin: Skin is warm and dry. Rash noted. Rash is maculopapular.  Multiple maculopapular lesions on bilateral arms with erythema present.   Nursing note  and vitals reviewed.     Patient has been counseled extensively about nutrition and exercise as well as the importance of adherence with medications and regular follow-up. The patient was given clear instructions to go to ER or return to medical center if symptoms don't improve, worsen or new problems develop. The patient verbalized understanding.   Follow-up: Return in about 3 months (around 11/16/2017) for DM.   Gildardo Pounds, FNP-BC [facility] and [place], [place]   08/22/2017, 12:31 AM

## 2017-08-17 ENCOUNTER — Telehealth: Payer: Self-pay | Admitting: Nurse Practitioner

## 2017-08-17 LAB — CMP14+EGFR
ALT: 15 IU/L (ref 0–32)
AST: 10 IU/L (ref 0–40)
Albumin/Globulin Ratio: 1.8 (ref 1.2–2.2)
Albumin: 4.6 g/dL (ref 3.5–5.5)
Alkaline Phosphatase: 54 IU/L (ref 39–117)
BUN/Creatinine Ratio: 16 (ref 9–23)
BUN: 7 mg/dL (ref 6–20)
Bilirubin Total: <0.2 mg/dL (ref 0.0–1.2)
CO2: 25 mmol/L (ref 20–29)
Calcium: 9.8 mg/dL (ref 8.7–10.2)
Chloride: 103 mmol/L (ref 96–106)
Creatinine, Ser: 0.45 mg/dL — ABNORMAL LOW (ref 0.57–1.00)
GFR calc Af Amer: 152 mL/min/{1.73_m2} (ref >59)
GFR calc non Af Amer: 132 mL/min/{1.73_m2} (ref >59)
Globulin, Total: 2.6 g/dL (ref 1.5–4.5)
Glucose: 136 mg/dL — ABNORMAL HIGH (ref 65–99)
Potassium: 4.3 mmol/L (ref 3.5–5.2)
Sodium: 140 mmol/L (ref 134–144)
Total Protein: 7.2 g/dL (ref 6.0–8.5)

## 2017-08-17 LAB — LIPID PANEL
Chol/HDL Ratio: 2.1 ratio (ref 0.0–4.4)
Cholesterol, Total: 103 mg/dL (ref 100–199)
HDL: 48 mg/dL (ref >39)
LDL Calculated: 27 mg/dL (ref 0–99)
Triglycerides: 142 mg/dL (ref 0–149)
VLDL Cholesterol Cal: 28 mg/dL (ref 5–40)

## 2017-08-17 LAB — CBC
Hematocrit: 42.8 % (ref 34.0–46.6)
Hemoglobin: 14.2 g/dL (ref 11.1–15.9)
MCH: 30.5 pg (ref 26.6–33.0)
MCHC: 33.2 g/dL (ref 31.5–35.7)
MCV: 92 fL (ref 79–97)
Platelets: 280 10*3/uL (ref 150–450)
RBC: 4.66 x10E6/uL (ref 3.77–5.28)
RDW: 14.6 % (ref 12.3–15.4)
WBC: 8.5 10*3/uL (ref 3.4–10.8)

## 2017-08-17 NOTE — Telephone Encounter (Signed)
6 page, paperwork received through fax 08-17-17.

## 2017-08-22 ENCOUNTER — Encounter: Payer: Self-pay | Admitting: Nurse Practitioner

## 2017-09-01 MED FILL — metFORMIN HCL 500 MG TABS: 500 | 30 days supply | Qty: 60 | Fill #5

## 2017-09-01 MED FILL — PATADAY 0.2% EYE DROPS: 0.2 | 25 days supply | Qty: 3 | Fill #11

## 2017-09-01 MED FILL — ACCU-CHEK AVIVA PLUS TEST S: 30 days supply | Qty: 100 | Fill #11

## 2017-09-01 MED FILL — risperiDONE 1 MG TABS: 1 | 30 days supply | Qty: 90 | Fill #3

## 2017-09-06 NOTE — Telephone Encounter (Signed)
Patients mother came into the office today for update on citizenship paperwork. Please fu at your earliest convenience.

## 2017-09-20 ENCOUNTER — Telehealth: Payer: Self-pay

## 2017-09-20 NOTE — Telephone Encounter (Signed)
CMA attempt to call patient.  No answer and left a VM for patient to call back.

## 2017-09-20 NOTE — Telephone Encounter (Signed)
Paper work is ready

## 2017-09-20 NOTE — Telephone Encounter (Signed)
CMA attempt to call to inform paperwork is ready for pick up. No answer and left a VM.

## 2017-09-21 MED FILL — PATADAY 0.2% EYE DROPS: 0.2 | 25 days supply | Qty: 3 | Fill #12

## 2017-09-27 ENCOUNTER — Encounter

## 2017-09-27 ENCOUNTER — Encounter: Payer: Self-pay | Admitting: Nurse Practitioner

## 2017-09-27 ENCOUNTER — Ambulatory Visit: Payer: Medicaid Other | Attending: Nurse Practitioner | Admitting: Nurse Practitioner

## 2017-09-27 VITALS — BP 133/81 | HR 110 | Temp 97.8°F | Ht 59.0 in | Wt 142.0 lb

## 2017-09-27 DIAGNOSIS — E119 Type 2 diabetes mellitus without complications: Secondary | ICD-10-CM | POA: Diagnosis not present

## 2017-09-27 DIAGNOSIS — Z0001 Encounter for general adult medical examination with abnormal findings: Secondary | ICD-10-CM | POA: Insufficient documentation

## 2017-09-27 DIAGNOSIS — H9190 Unspecified hearing loss, unspecified ear: Secondary | ICD-10-CM | POA: Diagnosis not present

## 2017-09-27 DIAGNOSIS — Z885 Allergy status to narcotic agent status: Secondary | ICD-10-CM | POA: Insufficient documentation

## 2017-09-27 DIAGNOSIS — F79 Unspecified intellectual disabilities: Secondary | ICD-10-CM | POA: Insufficient documentation

## 2017-09-27 DIAGNOSIS — K5900 Constipation, unspecified: Secondary | ICD-10-CM | POA: Insufficient documentation

## 2017-09-27 DIAGNOSIS — Z7984 Long term (current) use of oral hypoglycemic drugs: Secondary | ICD-10-CM | POA: Diagnosis not present

## 2017-09-27 DIAGNOSIS — H5462 Unqualified visual loss, left eye, normal vision right eye: Secondary | ICD-10-CM | POA: Diagnosis not present

## 2017-09-27 LAB — GLUCOSE, POCT (MANUAL RESULT ENTRY): POC Glucose: 218 mg/dl — AB (ref 70–99)

## 2017-09-27 MED ORDER — METFORMIN HCL 500 MG PO TABS: 500.0000 mg | ORAL_TABLET | Freq: Two times a day (BID) | ORAL | 3 refills | Status: DC

## 2017-09-27 MED ORDER — DOCUSATE SODIUM 100 MG PO CAPS: 100.0000 mg | ORAL_CAPSULE | Freq: Every day | ORAL | 0 refills | Status: DC

## 2017-09-27 MED FILL — risperiDONE 1 MG TABS: 1 | 30 days supply | Qty: 90 | Fill #4

## 2017-09-27 MED FILL — metFORMIN HCL 500 MG TABS: 500 | 30 days supply | Qty: 60 | Fill #6

## 2017-09-27 MED FILL — ACCU-CHEK AVIVA PLUS TEST S: 30 days supply | Qty: 100 | Fill #0

## 2017-09-27 NOTE — Progress Notes (Signed)
Assessment & Plan:  [name] was seen today for annual exam.  Diagnoses and all orders for this visit:  Encounter for routine adult physical exam with abnormal findings  Type 2 diabetes mellitus without complication, without long-term current use of insulin (HCC) -     Glucose (CBG) -     Ambulatory referral to Ophthalmology -     metFORMIN (GLUCOPHAGE) 500 MG tablet; Take 1 tablet (500 mg total) by mouth 2 (two) times daily with a meal.  Constipation, unspecified constipation type -     docusate sodium (COLACE) 100 MG capsule; Take 1 capsule (100 mg total) by mouth daily. Onset 3 days ago. However patient's mother endorses history of intermittent constipation.   Patient has been counseled on age-appropriate routine health concerns for screening and prevention. These are reviewed and up-to-date. Referrals have been placed accordingly. Immunizations are up-to-date or declined.    Subjective:   Chief Complaint  Patient presents with  . Annual Exam    Pt. is here for physical   HPI Makayla Roberts 32 y.o. female presents to office today for annual physical exam. VRI was used to communicate directly with patient for the entire encounter including providing detailed patient instructions. Her mother is her historian today. Patient is deaf, non verbal and has mental retardation. Her mother states she will not be able to undress her for a physical exam today due to difficulty in dressing and undressing the patient. Exam today is limited.      Review of Systems  Constitutional: Negative.  Negative for fever.  HENT: Positive for hearing loss (DEAF).   Eyes:       BLIND LEFT EYE  Respiratory: Negative.  Negative for cough, sputum production, shortness of breath and wheezing.   Cardiovascular: Negative.  Negative for leg swelling.  Gastrointestinal: Positive for constipation.  Genitourinary: Negative.   Musculoskeletal: Negative.  Negative for joint pain and myalgias.  Skin: Negative.   Negative for rash.  Neurological: Positive for speech change (non verbal).  Endo/Heme/Allergies: Positive for environmental allergies.  Psychiatric/Behavioral: The patient is nervous/anxious.        MENTAL RETARDATION She takes risperdal for agitation    Past Medical History:  Diagnosis Date  . Blind left eye   . Deaf   . Developmental delay   . Diabetes mellitus without complication ([place])   . Mental retardation     Past Surgical History:  Procedure Laterality Date  . None      Family History  Problem Relation Age of Onset  . Hypertension Mother   . Arthritis Mother   . Heart disease Mother   . Diabetes Father   . Hypertension Father     Social History Reviewed with no changes to be made today.   Outpatient Medications Prior to Visit  Medication Sig Dispense Refill  . Blood Glucose Monitoring Suppl (ACCU-CHEK AVIVA PLUS) w/Device KIT Use as instructed 1 kit 0  . cetirizine (ZYRTEC) 10 MG tablet Take 1 tablet (10 mg total) by mouth daily. Prn itchin 30 tablet 11  . glucose blood (ACCU-CHEK AVIVA PLUS) test strip USE TO TEST BLOOD SUGAR  AS INSTRUCTED 100 each 12  . hydrocortisone 1 % lotion Apply 1 application topically 2 (two) times daily. 118 mL 1  . ketotifen (ZADITOR) 0.025 % ophthalmic solution Place 1 drop into both eyes 2 (two) times daily. 5 mL 1  . Lancets (ACCU-CHEK SOFT TOUCH) lancets Use as instructed 100 each 12  . risperiDONE (RISPERDAL) 1 MG  tablet Take 1 tablet (1 mg total) by mouth 3 (three) times daily. 90 tablet 11  . metFORMIN (GLUCOPHAGE) 500 MG tablet Take 1 tablet (500 mg total) by mouth 2 (two) times daily with a meal. 180 tablet 3   No facility-administered medications prior to visit.     Allergies  Allergen Reactions  . Morphine And Related Shortness Of Breath       Objective:    BP 133/81 (BP Location: Left Arm, Patient Position: Sitting, Cuff Size: Normal)   Pulse (!) 110   Temp 97.8 F (36.6 C) (Oral)   Ht _0  (1.499 m)   Wt  142 lb (64.4 kg)   SpO2 97%   BMI 28.68 kg/m  Wt Readings from Last 3 Encounters:  09/27/17 142 lb (64.4 kg)  08/16/17 136 lb 12.8 oz (62.1 kg)  07/14/17 135 lb 12.8 oz (61.6 kg)    Physical Exam  Constitutional: She is oriented to person, place, and time. She appears well-developed and well-nourished.  HENT:  Head: Normocephalic and atraumatic.  Left Ear: Tympanic membrane, external ear and ear canal normal.  Nose: Nose normal.  Mouth/Throat: Oropharynx is clear and moist. Abnormal dentition (poor dentition). Dental caries present. No oropharyngeal exudate.  I am unable to visualize the right tympanic membrane due to impacted cerumen  Eyes: Right eye exhibits no discharge. No scleral icterus.  She is blind in the left eye with mild ptosis of left eye lid.  Neck: Normal range of motion. Neck supple. No thyromegaly present.  Cardiovascular: Regular rhythm and normal heart sounds. Tachycardia present. Exam reveals no friction rub.  No murmur heard. Pulmonary/Chest: Effort normal and breath sounds normal. No accessory muscle usage. No respiratory distress. She has no decreased breath sounds. She has no wheezes. She has no rhonchi. She has no rales. Breasts are symmetrical.  Abdominal: Soft. Bowel sounds are normal. She exhibits no distension and no mass. There is no tenderness. There is no rebound and no guarding.  Musculoskeletal: Normal range of motion. She exhibits no edema, tenderness or deformity.  Neurological: She is alert and oriented to person, place, and time. She has normal reflexes. No cranial nerve deficit. Coordination normal.  Skin: Skin is warm and dry. No erythema.  Psychiatric: She has a normal mood and affect. Her behavior is normal.         Patient has been counseled extensively about nutrition and exercise as well as the importance of adherence with medications and regular follow-up. The patient was given clear instructions to go to ER or return to medical center  if symptoms don't improve, worsen or new problems develop. The patient verbalized understanding.   Follow-up: Return in about 2 months (around 11/27/2017) for DM.   Gildardo Pounds, FNP-BC [facility] and [place], [place]   09/27/2017, 2:49 PM

## 2017-10-28 MED FILL — risperiDONE 1 MG TABS: 1 | 30 days supply | Qty: 90 | Fill #5

## 2017-10-28 MED FILL — metFORMIN HCL 500 MG TABS: 500 | 30 days supply | Qty: 60 | Fill #7

## 2017-10-28 MED FILL — ACCU-CHEK AVIVA PLUS TEST S: 30 days supply | Qty: 100 | Fill #1

## 2017-10-28 MED FILL — PATADAY 0.2% EYE DROPS: 0.2 | 25 days supply | Qty: 3 | Fill #0

## 2017-11-19 ENCOUNTER — Ambulatory Visit: Payer: Self-pay | Admitting: Nurse Practitioner

## 2017-11-26 MED FILL — metFORMIN HCL 500 MG TABS: 500 | 30 days supply | Qty: 60 | Fill #8

## 2017-11-26 MED FILL — ACCU-CHEK AVIVA PLUS TEST S: 30 days supply | Qty: 100 | Fill #2

## 2017-11-26 MED FILL — risperiDONE 1 MG TABS: 1 | 30 days supply | Qty: 90 | Fill #6

## 2017-11-26 MED FILL — PATADAY 0.2% EYE DROPS: 0.2 | 25 days supply | Qty: 3 | Fill #1

## 2017-12-30 MED FILL — PATADAY 0.2% EYE DROPS: 0.2 | 25 days supply | Qty: 3 | Fill #2

## 2017-12-30 MED FILL — ACCU-CHEK AVIVA PLUS TEST S: 30 days supply | Qty: 100 | Fill #3

## 2017-12-30 MED FILL — metFORMIN HCL 500 MG TABS: 500 | 30 days supply | Qty: 60 | Fill #9

## 2017-12-30 MED FILL — risperiDONE 1 MG TABS: 1 | 30 days supply | Qty: 90 | Fill #7

## 2018-01-25 MED FILL — PATADAY 0.2% EYE DROPS: 0.2 | 25 days supply | Qty: 3 | Fill #3

## 2018-01-25 MED FILL — risperiDONE 1 MG TABS: 1 | 30 days supply | Qty: 90 | Fill #8

## 2018-01-25 MED FILL — metFORMIN HCL 500 MG TABS: 500 | 30 days supply | Qty: 60 | Fill #10

## 2018-01-25 MED FILL — ACCU-CHEK AVIVA PLUS TEST S: 30 days supply | Qty: 100 | Fill #4

## 2018-02-04 LAB — HM DIABETES EYE EXAM

## 2018-02-28 ENCOUNTER — Other Ambulatory Visit (HOSPITAL_COMMUNITY)
Admission: RE | Admit: 2018-02-28 | Discharge: 2018-02-28 | Disposition: A | Discharge status: Home / Self Care | Payer: Medicaid Other | Source: Ambulatory Visit | Attending: Nurse Practitioner | Admitting: Nurse Practitioner

## 2018-02-28 ENCOUNTER — Ambulatory Visit: Payer: Medicaid Other | Attending: Nurse Practitioner | Admitting: Nurse Practitioner

## 2018-02-28 ENCOUNTER — Encounter: Payer: Self-pay | Admitting: Nurse Practitioner

## 2018-02-28 VITALS — BP 142/96 | HR 101 | Temp 98.2°F | Resp 16 | Wt 145.0 lb

## 2018-02-28 DIAGNOSIS — I10 Essential (primary) hypertension: Secondary | ICD-10-CM | POA: Diagnosis not present

## 2018-02-28 DIAGNOSIS — Z7984 Long term (current) use of oral hypoglycemic drugs: Secondary | ICD-10-CM | POA: Insufficient documentation

## 2018-02-28 DIAGNOSIS — L22 Diaper dermatitis: Secondary | ICD-10-CM | POA: Insufficient documentation

## 2018-02-28 DIAGNOSIS — E119 Type 2 diabetes mellitus without complications: Secondary | ICD-10-CM | POA: Insufficient documentation

## 2018-02-28 DIAGNOSIS — Z885 Allergy status to narcotic agent status: Secondary | ICD-10-CM | POA: Insufficient documentation

## 2018-02-28 DIAGNOSIS — H9190 Unspecified hearing loss, unspecified ear: Secondary | ICD-10-CM | POA: Diagnosis not present

## 2018-02-28 DIAGNOSIS — Z8249 Family history of ischemic heart disease and other diseases of the circulatory system: Secondary | ICD-10-CM | POA: Diagnosis not present

## 2018-02-28 DIAGNOSIS — E1165 Type 2 diabetes mellitus with hyperglycemia: Secondary | ICD-10-CM

## 2018-02-28 DIAGNOSIS — H5462 Unqualified visual loss, left eye, normal vision right eye: Secondary | ICD-10-CM | POA: Insufficient documentation

## 2018-02-28 DIAGNOSIS — Z79899 Other long term (current) drug therapy: Secondary | ICD-10-CM | POA: Insufficient documentation

## 2018-02-28 DIAGNOSIS — F79 Unspecified intellectual disabilities: Secondary | ICD-10-CM | POA: Diagnosis not present

## 2018-02-28 LAB — POCT URINALYSIS DIP (CLINITEK)
Bilirubin, UA: NEGATIVE
Blood, UA: NEGATIVE
Glucose, UA: 250 mg/dL — AB
Ketones, POC UA: NEGATIVE mg/dL
Leukocytes, UA: NEGATIVE
Nitrite, UA: NEGATIVE
POC PROTEIN,UA: NEGATIVE
Spec Grav, UA: <=1.005 — AB (ref 1.010–1.025)
Urobilinogen, UA: 0.2 E.U./dL
pH, UA: 5 (ref 5.0–8.0)

## 2018-02-28 LAB — POCT GLYCOSYLATED HEMOGLOBIN (HGB A1C): HbA1c, POC (controlled diabetic range): 7.5 % — AB (ref 0.0–7.0)

## 2018-02-28 LAB — GLUCOSE, POCT (MANUAL RESULT ENTRY): POC Glucose: 262 mg/dl — AB (ref 70–99)

## 2018-02-28 MED ORDER — ZINC OXIDE 13 % EX CREA: TOPICAL_CREAM | CUTANEOUS | 1 refills | Status: DC

## 2018-02-28 MED ORDER — LISINOPRIL 5 MG PO TABS: 5.0000 mg | ORAL_TABLET | Freq: Every day | ORAL | 3 refills | Status: DC

## 2018-02-28 MED ORDER — GLUCOSE BLOOD VI STRP
ORAL_STRIP | 12 refills | Status: DC
Start: 2018-02-28 — End: 2018-03-28

## 2018-02-28 MED ORDER — METFORMIN HCL 500 MG PO TABS: 1000.0000 mg | ORAL_TABLET | Freq: Two times a day (BID) | ORAL | 1 refills | Status: DC

## 2018-02-28 MED FILL — ACCU-CHEK GUIDE TEST STRIP: 50 days supply | Qty: 100 | Fill #0

## 2018-02-28 MED FILL — LISINOPRIL 5 MG TAB: 5 | 30 days supply | Qty: 30 | Fill #0

## 2018-02-28 MED FILL — metFORMIN HCL 500 MG TABS: 500 | 30 days supply | Qty: 120 | Fill #0

## 2018-02-28 MED FILL — PATADAY 0.2% EYE DROPS: 0.2 | 25 days supply | Qty: 3 | Fill #4

## 2018-02-28 MED FILL — risperiDONE 1 MG TABS: 1 | 30 days supply | Qty: 90 | Fill #9

## 2018-02-28 NOTE — Progress Notes (Signed)
Assessment & Plan:  [name] was seen today for diabetes.  Diagnoses and all orders for this visit:  Type 2 diabetes mellitus without complication, without long-term current use of insulin (HCC) -     POCT glucose (manual entry) -     POCT glycosylated hemoglobin (Hb A1C) -     Microalbumin/Creatinine Ratio, Urine -     metFORMIN (GLUCOPHAGE) 500 MG tablet; Take 2 tablets (1,000 mg total) by mouth 2 (two) times daily with a meal. -     glucose blood (ACCU-CHEK AVIVA PLUS) test strip; USE TO TEST BLOOD SUGAR  AS INSTRUCTED. Check blood glucose levels twice per day Continue blood sugar control as discussed in office today, low carbohydrate diet, and regular physical exercise as tolerated, 150 minutes per week (30 min each day, 5 days per week, or 50 min 3 days per week). Keep blood sugar logs with fasting goal of 90-130 mg/dl, post prandial (after you eat) less than 180.  For Hypoglycemia: BS <60 and Hyperglycemia BS >400; contact the clinic ASAP. Annual eye exams and foot exams are recommended.   Diaper dermatitis -     POCT URINALYSIS DIP (CLINITEK) -     Zinc Oxide 13 % CREA; Apply topically to affected area several times per day. -     Urine cytology ancillary only  Essential hypertension -     lisinopril (PRINIVIL,ZESTRIL) 5 MG tablet; Take 1 tablet (5 mg total) by mouth daily. Chronic tachycardia. If blood pressure still elevated at next office visit. May benefit from beta blocker.  Continue all antihypertensives as prescribed.  Remember to bring in your blood pressure log with you for your follow up appointment.  DASH/Mediterranean Diets are healthier choices for HTN.    Patient has been counseled on age-appropriate routine health concerns for screening and prevention. These are reviewed and up-to-date. Referrals have been placed accordingly. Immunizations are up-to-date or declined.    Subjective:   Chief Complaint  Patient presents with  . Diabetes   HPI Makayla Roberts 33  y.o. female presents to office today for follow up to DM. She is accompanied by her mother. Telephone interpreter was used to communicate directly with patient for the entire encounter including providing detailed patient instructions. 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 Her mother has her citizenship papers with her today and reports they need to be redone. I have instructed her that the citizenship representative needs to call me and discuss what details he needs added to the form.  Her mother also reports a bilateral groin rash today. Started a few days ago. She believes it is from the diaper she is wearing. I am unable to perform a physical exam due to her pacing the floor today and will not sit on the table long enough to evaluate.    DM TYPE 2 Not well controlled . Current medications include metformin 500 mg BID. Checking blood glucose levels BID with average readings 180-250. Her mother reports she becomes very irate when she is not given extra food and she goes into the kitchen and steals extra food. I have recommended that she only bring healthy foods into the house as this may cut down on some of the unhealthy snacking. A1c has increased as well as her weight and BP. I will increase metformin to 593m 2 tab BID and have her follow up in a few weeks for BP recheck and  glucose meter check with the pharmacist. She may need an additional glucose agent if still poorly controlled. Will also start lisinopril 75m today. Her mother denies any hypo or hyperglycemic symptoms. She is UTD on her eye exam.  Lab Results  Component Value Date   HGBA1C 7.5 (A) 02/28/2018   Lab Results  Component Value Date   HGBA1C 6.6 (A) 08/16/2017   BP Readings from Last 3 Encounters:  02/28/18 (!) 142/96  09/27/17 133/81  08/16/17 (!) 125/96   Review of Systems  Constitutional: Negative for fever, malaise/fatigue and weight loss.  HENT:  Negative.  Negative for nosebleeds.   Eyes: Negative for discharge and redness.  Respiratory: Negative.  Negative for cough and shortness of breath.   Cardiovascular: Negative.  Negative for chest pain, palpitations and leg swelling.  Gastrointestinal: Negative.  Negative for heartburn, nausea and vomiting.  Musculoskeletal: Negative.  Negative for myalgias.  Skin: Positive for itching and rash.  Neurological: Negative.  Negative for dizziness, focal weakness, seizures and headaches.  Psychiatric/Behavioral: Negative for suicidal ideas. The patient is nervous/anxious.     Past Medical History:  Diagnosis Date  . Blind left eye   . Deaf   . Developmental delay   . [place]   . Mental retardation     Past Surgical History:  Procedure Laterality Date  . None      Family History  Problem Relation Age of Onset  . Hypertension Mother   . Arthritis Mother   . Heart disease Mother   . Diabetes Father   . Hypertension Father     Social History Reviewed with no changes to be made today.   Outpatient Medications Prior to Visit  Medication Sig Dispense Refill  . Blood Glucose Monitoring Suppl (ACCU-CHEK AVIVA PLUS) w/Device KIT Use as instructed 1 kit 0  . cetirizine (ZYRTEC) 10 MG tablet Take 1 tablet (10 mg total) by mouth daily. Prn itchin 30 tablet 11  . hydrocortisone 1 % lotion Apply 1 application topically 2 (two) times daily. 118 mL 1  . ketotifen (ZADITOR) 0.025 % ophthalmic solution Place 1 drop into both eyes 2 (two) times daily. 5 mL 1  . Lancets (ACCU-CHEK SOFT TOUCH) lancets Use as instructed 100 each 12  . risperiDONE (RISPERDAL) 1 MG tablet Take 1 tablet (1 mg total) by mouth 3 (three) times daily. 90 tablet 11  . docusate sodium (COLACE) 100 MG capsule Take 1 capsule (100 mg total) by mouth daily. 20 capsule 0  . glucose blood (ACCU-CHEK AVIVA PLUS) test strip USE TO TEST BLOOD SUGAR  AS INSTRUCTED 100 each 12  . metFORMIN  (GLUCOPHAGE) 500 MG tablet Take 1 tablet (500 mg total) by mouth 2 (two) times daily with a meal. 180 tablet 3   No facility-administered medications prior to visit.     Allergies  Allergen Reactions  . Morphine And Related Shortness Of Breath       Objective:    BP (!) 142/96   Pulse (!) 101   Temp 98.2 F (36.8 C) (Oral)   Resp 16   Wt 145 lb (65.8 kg)   SpO2 98%   BMI 29.29 kg/m  Wt Readings from Last 3 Encounters:  02/28/18 145 lb (65.8 kg)  09/27/17 142 lb (64.4 kg)  08/16/17 136 lb 12.8 oz (62.1 kg)    Physical Exam Vitals signs and nursing note reviewed.  Constitutional:      Appearance: She is well-developed.  HENT:  Head: Normocephalic and atraumatic.  Neck:     Musculoskeletal: Normal range of motion.  Cardiovascular:     Rate and Rhythm: Regular rhythm. Tachycardia present.     Heart sounds: Normal heart sounds. No murmur. No friction rub. No gallop.   Pulmonary:     Effort: Pulmonary effort is normal. No tachypnea or respiratory distress.     Breath sounds: Normal breath sounds. No decreased breath sounds, wheezing, rhonchi or rales.  Chest:     Chest wall: No tenderness.  Abdominal:     General: Bowel sounds are normal.     Palpations: Abdomen is soft.  Genitourinary:    Comments: Unable to visualize. SEE HPI Musculoskeletal: Normal range of motion.  Skin:    General: Skin is warm and dry.  Neurological:     Mental Status: She is alert and oriented to person, place, and time.     Coordination: Coordination normal.  Psychiatric:        Speech: She is noncommunicative.        Behavior: Behavior is hyperactive.        Judgment: Judgment is impulsive.        Patient has been counseled extensively about nutrition and exercise as well as the importance of adherence with medications and regular follow-up. The patient was given clear instructions to go to ER or return to medical center if symptoms don't improve, worsen or new problems develop.  The patient verbalized understanding.   Follow-up: Return in about 4 weeks (around 03/28/2018) for bring meter to see [facility]; increased metformin, BP recheck and NEEDS LABS.   Gildardo Pounds, FNP-BC [facility] [place], [place]   02/28/2018, 11:56 AM

## 2018-03-01 LAB — URINE CYTOLOGY ANCILLARY ONLY
Chlamydia: NEGATIVE
Neisseria Gonorrhea: NEGATIVE
Trichomonas: NEGATIVE

## 2018-03-01 LAB — MICROALBUMIN / CREATININE URINE RATIO
Creatinine, Urine: 50.3 mg/dL
Microalb/Creat Ratio: 11.5 mg/g creat (ref 0.0–30.0)
Microalbumin, Urine: 5.8 ug/mL

## 2018-03-02 ENCOUNTER — Other Ambulatory Visit: Payer: Self-pay | Admitting: Nurse Practitioner

## 2018-03-02 LAB — URINE CYTOLOGY ANCILLARY ONLY
Bacterial vaginitis: POSITIVE — AB
Candida vaginitis: NEGATIVE

## 2018-03-02 MED ORDER — METRONIDAZOLE 500 MG PO TABS: 500.0000 mg | ORAL_TABLET | Freq: Two times a day (BID) | ORAL | 0 refills | Status: AC

## 2018-03-03 MED FILL — metroNIDAZOLE 500 MG TABS: 500 | 7 days supply | Qty: 14 | Fill #0

## 2018-03-16 ENCOUNTER — Encounter (HOSPITAL_COMMUNITY): Payer: Self-pay

## 2018-03-16 ENCOUNTER — Telehealth: Payer: Self-pay | Admitting: Nurse Practitioner

## 2018-03-16 ENCOUNTER — Ambulatory Visit (HOSPITAL_COMMUNITY)
Admission: EM | Admit: 2018-03-16 | Discharge: 2018-03-16 | Disposition: A | Discharge status: Home / Self Care | Payer: Medicaid Other | Attending: Family Medicine | Admitting: Family Medicine

## 2018-03-16 DIAGNOSIS — B9789 Other viral agents as the cause of diseases classified elsewhere: Secondary | ICD-10-CM | POA: Insufficient documentation

## 2018-03-16 DIAGNOSIS — J069 Acute upper respiratory infection, unspecified: Secondary | ICD-10-CM | POA: Diagnosis not present

## 2018-03-16 MED ORDER — CETIRIZINE HCL 1 MG/ML PO SOLN: 10.0000 mg | Freq: Every day | ORAL | 0 refills | Status: DC

## 2018-03-16 MED ORDER — PSEUDOEPH-BROMPHEN-DM 30-2-10 MG/5ML PO SYRP: 5.0000 mL | ORAL_SOLUTION | Freq: Four times a day (QID) | ORAL | 0 refills | Status: DC | PRN

## 2018-03-16 NOTE — ED Provider Notes (Signed)
[facility]    CSN: [id number] Arrival date & time: 03/16/18  9390     History   Chief Complaint Chief Complaint  Patient presents with  . Cough    HPI Arabic interpretation through [employer] interpreter, history provided from mom Makayla Roberts is a 33 y.o. female, nonverbal, history of mental disability, left eye blindness, DM type 2, HTN, Presenting today for evaluation of [place] symptoms.  Patient has had congestion, sore throat and cough.  Symptoms began 2 days ago.  Mom has also noted her feeling warm and subjective fevers.  Denies any known measured fevers.  She is also had decreased oral intake.  Last dose of Tylenol was around 7 AM this morning.  Denies vomiting or diarrhea.  Notes that when she is eating she occasionally has some choking.  HPI  Past Medical History:  Diagnosis Date  . Blind left eye   . Deaf   . Developmental delay   . Diabetes mellitus without complication ([place])   . Mental retardation     Patient Active Problem List   Diagnosis Date Noted  . Sorethroat 03/03/2017  . Itchy eyes 08/12/2016  . Polyphagia 08/12/2016  . Behavior problem, adult 05/12/2016  . School physical exam 06/20/2015  . Type 2 diabetes mellitus without complication, without long-term current use of insulin ([place]) 06/20/2015  . Intellectual disability 12/27/2014  . Tachycardia with hypertension 12/27/2014  . Right ear impacted cerumen 09/14/2014  . Upper respiratory infection 09/14/2014  . Sinus tachycardia 09/14/2014  . Type 2 diabetes mellitus without complication ([place]) 30/10/2328  . Redness of eye, left 08/23/2014  . Other specified diabetes mellitus without complications ([place]) 07/62/2633  . Allergic rhinoconjunctivitis of left eye 03/15/2014  . Hypovitaminosis D 03/02/2013  . Unspecified hypothyroidism 03/02/2013  . Essential hypertension, benign 03/02/2013  . Blindness of left eye 01/11/2013  . Impaired speech 01/11/2013  . Developmental disability 01/11/2013     Past Surgical History:  Procedure Laterality Date  . None      OB History    Gravida  0   Para  0   Term  0   Preterm  0   AB  0   Living        SAB  0   TAB  0   Ectopic  0   Multiple      Live Births               Home Medications    Prior to Admission medications   Medication Sig Start Date End Date Taking? Authorizing Provider  Blood Glucose Monitoring Suppl (ACCU-CHEK AVIVA PLUS) w/Device KIT Use as instructed 08/16/17   Gildardo Pounds, NP  brompheniramine-pseudoephedrine-DM 30-2-10 MG/5ML syrup Take 5 mLs by mouth 4 (four) times daily as needed. 03/16/18   Ailea Rhatigan C, PA-C  cetirizine HCl (ZYRTEC) 1 MG/ML solution Take 10 mLs (10 mg total) by mouth daily for 10 days. 03/16/18 03/26/18  Nate Perri C, PA-C  glucose blood (ACCU-CHEK AVIVA PLUS) test strip USE TO TEST BLOOD SUGAR  AS INSTRUCTED. Check blood glucose levels twice per day 02/28/18   Gildardo Pounds, NP  hydrocortisone 1 % lotion Apply 1 application topically 2 (two) times daily. 08/16/17   Gildardo Pounds, NP  ketotifen (ZADITOR) 0.025 % ophthalmic solution Place 1 drop into both eyes 2 (two) times daily. 08/12/16   Tresa Garter, MD  Lancets (ACCU-CHEK SOFT TOUCH) lancets Use as instructed 08/16/17   Gildardo Pounds, NP  lisinopril (PRINIVIL,ZESTRIL) 5 MG tablet Take 1 tablet (5 mg total) by mouth daily. 02/28/18   Gildardo Pounds, NP  metFORMIN (GLUCOPHAGE) 500 MG tablet Take 2 tablets (1,000 mg total) by mouth 2 (two) times daily with a meal. 02/28/18 05/29/18  Gildardo Pounds, NP  risperiDONE (RISPERDAL) 1 MG tablet Take 1 tablet (1 mg total) by mouth 3 (three) times daily. 05/17/17   Dennie Bible, NP  Zinc Oxide 13 % CREA Apply topically to affected area several times per day. 02/28/18   Gildardo Pounds, NP    Family History Family History  Problem Relation Age of Onset  . Hypertension Mother   . Arthritis Mother   . Heart disease Mother   . Diabetes Father   .  Hypertension Father     Social History Social History   Tobacco Use  . Smoking status: Never Smoker  . Smokeless tobacco: Never Used  Substance Use Topics  . Alcohol use: No  . Drug use: No     Allergies   Morphine and related   Review of Systems Review of Systems  Constitutional: Positive for fever. Negative for activity change, appetite change, chills and fatigue.  HENT: Positive for congestion, rhinorrhea and sore throat. Negative for ear pain, sinus pressure and trouble swallowing.   Eyes: Negative for discharge and redness.  Respiratory: Positive for cough. Negative for chest tightness and shortness of breath.   Cardiovascular: Negative for chest pain.  Gastrointestinal: Negative for abdominal pain, diarrhea, nausea and vomiting.  Musculoskeletal: Negative for myalgias.  Skin: Negative for rash.  Neurological: Negative for dizziness, light-headedness and headaches.     Physical Exam Triage Vital Signs ED Triage Vitals [03/16/18 0950]  Enc Vitals Group     BP (!) 100/47     Pulse Rate (!) 114     Resp 16     Temp 97.6 F (36.4 C)     Temp Source Oral     SpO2 98 %     Weight      Height      Head Circumference      Peak Flow      Pain Score 0     Pain Loc      Pain Edu?      Excl. in [place]?    No data found.  Updated Vital Signs BP (!) 100/47 (BP Location: Right Arm)   Pulse (!) 114   Temp 97.6 F (36.4 C) (Oral)   Resp 16   SpO2 98%  Heart rate rechecked and stabilized around 109 Visual Acuity Right Eye Distance:   Left Eye Distance:   Bilateral Distance:    Right Eye Near:   Left Eye Near:    Bilateral Near:     Physical Exam Vitals signs and nursing note reviewed.  Constitutional:      General: She is not in acute distress.    Appearance: She is well-developed.  HENT:     Head: Normocephalic and atraumatic.     Ears:     Comments: Bilateral ears without tenderness to palpation of external auricle, tragus and mastoid, EAC's without  erythema or swelling, TM's with good bony landmarks and cone of light. Non erythematous.    Nose:     Comments: Nasal mucosa pink, nonerythematous, no swollen turbinates    Mouth/Throat:     Comments: Oral mucosa pink and moist, no tonsillar enlargement or exudate. Posterior pharynx patent and nonerythematous, no uvula deviation or swelling. Normal phonation.  Eyes:     Comments: Left eye opaque/blind  Neck:     Musculoskeletal: Neck supple.  Cardiovascular:     Rate and Rhythm: Regular rhythm. Tachycardia present.     Heart sounds: No murmur.  Pulmonary:     Effort: Pulmonary effort is normal. No respiratory distress.     Breath sounds: Normal breath sounds.     Comments: Breathing comfortably at rest, CTABL, no wheezing, rales or other adventitious sounds auscultated Abdominal:     Palpations: Abdomen is soft.     Tenderness: There is no abdominal tenderness.  Skin:    General: Skin is warm and dry.  Neurological:     Mental Status: She is alert.      UC Treatments / Results  Labs (all labs ordered are listed, but only abnormal results are displayed) Labs Reviewed - No data to display  EKG None  Radiology No results found.  Procedures Procedures (including critical care time)  Medications Ordered in UC Medications - No data to display  Initial Impression / Assessment and Plan / UC Course  I have reviewed the triage vital signs and the nursing notes.  Pertinent labs & imaging results that were available during my care of the patient were reviewed by me and considered in my medical decision making (see chart for details).    Patient with 2 days of URI symptoms, throat exam was normal, do not suspect underlying strep or tonsillitis causing decreased oral intake or difficulties with eating.  At this time will recommend symptomatic and supportive care, provided Zyrtec to help with any postnasal drainage that is irritating throat and causing choking sensation.  Cough  syrup as needed.  Continue to monitor,Discussed strict return precautions. Patient verbalized understanding and is agreeable with plan.   Final Clinical Impressions(s) / UC Diagnoses   Final diagnoses:  Viral URI with cough     Discharge Instructions     Begin daily cetirizine to help with congestion and drainage that may be irritating her throat- please use consistently over next 1-2 weeks Cough syrup as needed for cough and congestion every 8 hours Tylenol and ibuprofen as needed for fever, pain  Follow up if not resolving    ED Prescriptions    Medication Sig Dispense Auth. Provider   cetirizine HCl (ZYRTEC) 1 MG/ML solution Take 10 mLs (10 mg total) by mouth daily for 10 days. 118 mL Glenn Christo C, PA-C   brompheniramine-pseudoephedrine-DM 30-2-10 MG/5ML syrup Take 5 mLs by mouth 4 (four) times daily as needed. 120 mL Rex Magee C, PA-C     Controlled Substance Prescriptions [place] Controlled Substance Registry consulted? Not Applicable   Janith Lima, [place] 03/16/18 1046

## 2018-03-16 NOTE — Telephone Encounter (Signed)
Pt mother came for forms she left for Meredeth Ide to fill out so mother can take to attorney for citizenship hearing. Called interpreter services Per provider the forms will be ready Friday 03/18/18.  Mother verbalized understanding and will come around noon on 03/18/18.

## 2018-03-16 NOTE — Discharge Instructions (Signed)
Begin daily cetirizine to help with congestion and drainage that may be irritating her throat- please use consistently over next 1-2 weeks Cough syrup as needed for cough and congestion every 8 hours Tylenol and ibuprofen as needed for fever, pain  Follow up if not resolving

## 2018-03-16 NOTE — ED Triage Notes (Signed)
Pt is non verbal, mom states pt has had a cough and sore throat x2days

## 2018-03-18 ENCOUNTER — Encounter: Payer: Self-pay | Admitting: Nurse Practitioner

## 2018-03-18 NOTE — Telephone Encounter (Signed)
Pt mother came this morning as previously instructed. Unable to locate forms for pt. F/U with Meredeth Ide for forms. Pt will be back here at 3:00 pm today to get the forms.

## 2018-03-28 ENCOUNTER — Ambulatory Visit: Payer: Medicaid Other | Attending: Family Medicine | Admitting: Pharmacist

## 2018-03-28 ENCOUNTER — Encounter: Payer: Self-pay | Admitting: Pharmacist

## 2018-03-28 VITALS — BP 118/82 | HR 103 | Temp 97.5°F

## 2018-03-28 DIAGNOSIS — Z7984 Long term (current) use of oral hypoglycemic drugs: Secondary | ICD-10-CM | POA: Insufficient documentation

## 2018-03-28 DIAGNOSIS — E119 Type 2 diabetes mellitus without complications: Secondary | ICD-10-CM | POA: Diagnosis present

## 2018-03-28 DIAGNOSIS — Z833 Family history of diabetes mellitus: Secondary | ICD-10-CM | POA: Insufficient documentation

## 2018-03-28 DIAGNOSIS — E1165 Type 2 diabetes mellitus with hyperglycemia: Secondary | ICD-10-CM | POA: Diagnosis not present

## 2018-03-28 DIAGNOSIS — Z8249 Family history of ischemic heart disease and other diseases of the circulatory system: Secondary | ICD-10-CM | POA: Insufficient documentation

## 2018-03-28 DIAGNOSIS — I10 Essential (primary) hypertension: Secondary | ICD-10-CM | POA: Diagnosis not present

## 2018-03-28 DIAGNOSIS — Z79899 Other long term (current) drug therapy: Secondary | ICD-10-CM | POA: Insufficient documentation

## 2018-03-28 LAB — GLUCOSE, POCT (MANUAL RESULT ENTRY): POC Glucose: 165 mg/dl — AB (ref 70–99)

## 2018-03-28 MED ORDER — ACCU-CHEK GUIDE W/DEVICE KIT: 1.0000 | PACK | Freq: Every day | 0 refills | Status: DC

## 2018-03-28 MED ORDER — GLUCOSE BLOOD VI STRP: ORAL_STRIP | 11 refills | Status: DC

## 2018-03-28 MED ORDER — ACCU-CHEK FASTCLIX LANCETS MISC: 1.0000 | Freq: Every day | 11 refills | Status: DC

## 2018-03-28 MED FILL — ACCU-CHEK FASTCLIX LANCETS: 30 days supply | Qty: 102 | Fill #0

## 2018-03-28 MED FILL — LISINOPRIL 5 MG TAB: 5 | 30 days supply | Qty: 30 | Fill #1

## 2018-03-28 MED FILL — metFORMIN HCL 500 MG TABS: 500 | 30 days supply | Qty: 120 | Fill #1

## 2018-03-28 MED FILL — ACCU-CHEK GUIDE W/DEVICE KI: W/DEVICE | 30 days supply | Qty: 1 | Fill #0

## 2018-03-28 MED FILL — ACCU-CHEK GUIDE TEST STRIP: 30 days supply | Qty: 100 | Fill #0

## 2018-03-28 NOTE — Patient Instructions (Signed)
shukraan lihudurikum liruyati alyawm. yrja alqiam bima yly:  1. tuasil almitfawrmin , 2 habat maratayn fi alyawm limard alsikari.  2. muasalat altahaquq min alsakriaat fi aldam fi almanzil.  3. muasalat 'iijra' taghyirat namat alhayat alty naqashnaha maeaan khilal ziaratina. yaleab alhimyat waltamarin dwrana mhmana fi tahsin nisbat alsukar fi aldm.  4. almutabaeat mae zilda 05/19/2018.     Thank you for coming to see me today. Please do the following:  1. Continue metformin, 2 tablets twice a day for diabetes.  2. Continue checking blood sugars at home. 3. Continue making the lifestyle changes we've discussed together during our visit. Diet and exercise play a significant role in improving your blood sugars.  4. Follow-up with Zelda 05/19/2018.

## 2018-03-28 NOTE — Progress Notes (Signed)
    S:     No chief complaint on file.  Patient arrives in good spirits. She is accompanied by her mother today who serves as the patient's caregiver. Presents for diabetes management at the request of Zelda who last saw Zelda 02/28/18. Pt's metformin was increased to 1g BID at that appointment.    Family/Social History:  - DM (father), Heart disease (mother)  Insurance coverage/medication affordability:  - [place] Medicaid  Patient reports adherence with medications.  Current diabetes medications include: metformin 500 mg, two tablets BID Current hypertension medications include: lisinopril 5 mg daily   Patient denies hypoglycemic events.  Patient reported dietary habits:  - Mother manages diet/substituted unhealthy snacks with fresh fruit since last visit  Patient-reported exercise habits:  - Does not exercise    Patient denies nocturia.  Patient denies neuropathy. Patient denies visual changes from baseline. Patient reports self foot exams.   O:  POCT: 165 (2 hour post-prandial)  Pt did not obtain meter; blood sugars are reported from mother Home fasting: 130s  Home post-prandial: 140-160s  Lab Results  Component Value Date   HGBA1C 7.5 (A) 02/28/2018   Vitals:   03/28/18 1555  BP: 118/82  Pulse: (!) 103  Temp: (!) 97.5 F (36.4 C)   Lipid Panel     Component Value Date/Time   CHOL 103 08/16/2017 1641   TRIG 142 08/16/2017 1641   HDL 48 08/16/2017 1641   CHOLHDL 2.1 08/16/2017 1641   LDLCALC 27 08/16/2017 1641   Clinical ASCVD: No  The ASCVD Risk score Denman George DC Jr., et al., 2013) failed to calculate for the following reasons:   The 2013 ASCVD risk score is only valid for ages 62 to 49   A/P: Diabetes longstanding currently uncontrolled based on A1c, however, since the increase in metformin, her sugars have improved. Today's POCT value is at goal. Patient is able to verbalize appropriate hypoglycemia management plan. Patient is adherent with  medication.  Will not make any changes given her reported home sugars. Provided her with a rx for a meter and supplies covered by insurance. Encouraged continued compliance.   -Continued metformin. -Extensively discussed pathophysiology of DM, recommended lifestyle interventions, dietary effects on glycemic control -Counseled on s/sx of and management of hypoglycemia -HM: Fluarix given -Next A1C anticipated 05/2018.   Hypertension longstanding currently controlled.  BP goal = <130/80 mmHg. Patient is adherent with medication.  - Continued lisinopril  Written patient instructions provided. Total time in face to face counseling 15 minutes.   Follow up w/ PCP.     Butch Penny, PharmD, CPP Clinical Pharmacist [facility] [phone]

## 2018-04-07 ENCOUNTER — Telehealth: Payer: Self-pay | Admitting: Nurse Practitioner

## 2018-04-07 NOTE — Telephone Encounter (Signed)
Patient mother stated she would drop off new paperwork but left the office without leaving any new paperwork to be completed.

## 2018-04-07 NOTE — Telephone Encounter (Signed)
Patient mother came in to check on the paperwork she left to be filled out. Please follow up.

## 2018-04-07 NOTE — Telephone Encounter (Signed)
Will route to PCP regarding about paperwork.

## 2018-04-08 NOTE — Telephone Encounter (Signed)
Completed. Patient's mother can pick up

## 2018-05-02 MED FILL — metFORMIN HCL 500 MG TABS: 500 | 30 days supply | Qty: 120 | Fill #2

## 2018-05-02 MED FILL — risperiDONE 1 MG TABS: 1 | 30 days supply | Qty: 90 | Fill #10

## 2018-05-02 MED FILL — LISINOPRIL 5 MG TAB: 5 | 30 days supply | Qty: 30 | Fill #2

## 2018-05-02 MED FILL — ACCU-CHEK GUIDE TEST STRIP: 30 days supply | Qty: 100 | Fill #1

## 2018-05-03 MED FILL — PATADAY 0.2% EYE DROPS: 0.2 | 25 days supply | Qty: 3 | Fill #5

## 2018-05-19 ENCOUNTER — Ambulatory Visit: Payer: Medicaid Other | Admitting: Neurology

## 2018-05-19 ENCOUNTER — Ambulatory Visit: Payer: Medicaid Other | Admitting: Nurse Practitioner

## 2018-05-31 ENCOUNTER — Other Ambulatory Visit: Payer: Self-pay | Admitting: *Deleted

## 2018-05-31 DIAGNOSIS — F89 Unspecified disorder of psychological development: Secondary | ICD-10-CM

## 2018-05-31 MED ORDER — RISPERIDONE 1 MG PO TABS: 1.0000 mg | ORAL_TABLET | Freq: Three times a day (TID) | ORAL | 11 refills | Status: DC

## 2018-05-31 MED FILL — PATADAY 0.2% EYE DROPS: 0.2 | 25 days supply | Qty: 3 | Fill #6

## 2018-05-31 MED FILL — metFORMIN HCL 500 MG TABS: 500 | 90 days supply | Qty: 360 | Fill #3

## 2018-05-31 MED FILL — ACCU-CHEK GUIDE TEST STRIP: 30 days supply | Qty: 100 | Fill #2

## 2018-06-07 ENCOUNTER — Other Ambulatory Visit: Payer: Self-pay | Admitting: *Deleted

## 2018-06-07 DIAGNOSIS — F89 Unspecified disorder of psychological development: Secondary | ICD-10-CM

## 2018-06-07 MED ORDER — RISPERIDONE 1 MG PO TABS: 1.0000 mg | ORAL_TABLET | Freq: Three times a day (TID) | ORAL | 3 refills | Status: DC

## 2018-06-07 MED ORDER — RISPERIDONE 3 MG PO TABS: 1.5000 mg | ORAL_TABLET | Freq: Two times a day (BID) | ORAL | 2 refills | Status: DC

## 2018-06-07 MED FILL — risperiDONE 3 MG TABS: 3 | 30 days supply | Qty: 30 | Fill #0

## 2018-06-07 NOTE — Telephone Encounter (Signed)
[facility], spoke to [place], pharmacist.   They are not getting approval for pts risperdal 1mg  tabs po TID from Medicaid.   Will require PA, or if ok the 3mg  tablets run through with no problem.  Please advise.

## 2018-06-07 NOTE — Telephone Encounter (Signed)
Cancelled Risperidone 1mg  prescription.  Placed new order for  Risperidone 3mg  tablets (1/2 tablet po bid).

## 2018-06-07 NOTE — Telephone Encounter (Signed)
respiradone needs to be changed to 3mg  a day due to medicaid not covering for pt to take 3 x a day

## 2018-06-07 NOTE — Telephone Encounter (Signed)
I have reviewed the chart, last visit was in April 2019, we may give her Rx of Risepridone 3mg , 1/2 tab twice a day.

## 2018-07-18 ENCOUNTER — Other Ambulatory Visit: Payer: Self-pay | Admitting: Nurse Practitioner

## 2018-07-18 DIAGNOSIS — E119 Type 2 diabetes mellitus without complications: Secondary | ICD-10-CM

## 2018-07-18 MED FILL — ACCU-CHEK GUIDE TEST STRIP: 30 days supply | Qty: 100 | Fill #3

## 2018-07-18 MED FILL — risperiDONE 3 MG TABS: 3 | 30 days supply | Qty: 30 | Fill #1

## 2018-07-21 ENCOUNTER — Telehealth: Payer: Self-pay | Admitting: *Deleted

## 2018-07-21 ENCOUNTER — Encounter: Payer: Self-pay | Admitting: *Deleted

## 2018-07-21 NOTE — Telephone Encounter (Signed)
I have called the language line [phone]) to reach out to the patient.  His appt on 08/01/2018 has been canceled due to scheduling changes due to COVID-19 precautions.  They were unable to reach him or his secondary contact.  Voicemail boxes were unavailable.  I will mail a letter today.

## 2018-08-01 ENCOUNTER — Ambulatory Visit: Payer: Medicaid Other | Admitting: Neurology

## 2018-08-29 MED FILL — metFORMIN HCL 500 MG TABS: 500 | 30 days supply | Qty: 120 | Fill #0

## 2018-08-29 MED FILL — risperiDONE 3 MG TABS: 3 | 30 days supply | Qty: 30 | Fill #2

## 2018-08-29 MED FILL — ACCU-CHEK GUIDE TEST STRIP: 30 days supply | Qty: 100 | Fill #4

## 2018-08-30 ENCOUNTER — Encounter (HOSPITAL_COMMUNITY): Payer: Self-pay

## 2018-08-30 ENCOUNTER — Other Ambulatory Visit: Payer: Self-pay

## 2018-08-30 ENCOUNTER — Ambulatory Visit (HOSPITAL_COMMUNITY)
Admission: EM | Admit: 2018-08-30 | Discharge: 2018-08-30 | Disposition: A | Discharge status: Home / Self Care | Payer: Medicaid Other | Attending: Urgent Care | Admitting: Urgent Care

## 2018-08-30 ENCOUNTER — Ambulatory Visit (INDEPENDENT_AMBULATORY_CARE_PROVIDER_SITE_OTHER): Payer: Medicaid Other

## 2018-08-30 DIAGNOSIS — M79671 Pain in right foot: Secondary | ICD-10-CM

## 2018-08-30 DIAGNOSIS — S92351A Displaced fracture of fifth metatarsal bone, right foot, initial encounter for closed fracture: Secondary | ICD-10-CM

## 2018-08-30 DIAGNOSIS — M7989 Other specified soft tissue disorders: Secondary | ICD-10-CM | POA: Diagnosis not present

## 2018-08-30 MED ORDER — NAPROXEN 500 MG PO TABS: 500.0000 mg | ORAL_TABLET | Freq: Two times a day (BID) | ORAL | 0 refills | Status: DC

## 2018-08-30 MED FILL — NAPROXEN 500 MG TABLET: 500 | 15 days supply | Qty: 30 | Fill #0

## 2018-08-30 NOTE — Discharge Instructions (Addendum)
[facility] °Phone: [phone] ° °[facility] °Phone: [phone] ° °[facility] °Phone: [phone] ° °[facility] °Phone: [phone] ° °

## 2018-08-30 NOTE — ED Triage Notes (Signed)
Pt's mom states the pt fell yesterday  And today she has pain in her left foot. Pt has swelling in her left foot.

## 2018-08-30 NOTE — ED Provider Notes (Signed)
MRN: [id number] DOB: December 29, 1985  Subjective:   Talisha Erby is a 33 y.o. female presenting for 1 day history of acute injury to her right foot.  Patient's mother presents with her and states that she was reaching down to get her medicine and ended up falling twisting and hitting her foot pretty hard against the floor.  She was in severe pain yesterday and has some improvement today but the swelling is persisting.  Patient's mother gave her Aleve yesterday with some relief.  No current facility-administered medications for this encounter.   Current Outpatient Medications:  .  ACCU-CHEK FASTCLIX LANCETS MISC, 1 kit by Does not apply route daily. Use to check blood sugar up to 3 times daily., Disp: 102 each, Rfl: 11 .  Blood Glucose Monitoring Suppl (ACCU-CHEK GUIDE) w/Device KIT, 1 kit by Does not apply route daily. Use to check blood sugar up to 3 times daily., Disp: 1 kit, Rfl: 0 .  glucose blood (ACCU-CHEK GUIDE) test strip, Use as instructed to check blood sugar up to 3 times daily., Disp: 100 each, Rfl: 11 .  lisinopril (PRINIVIL,ZESTRIL) 5 MG tablet, Take 1 tablet (5 mg total) by mouth daily., Disp: 90 tablet, Rfl: 3 .  metFORMIN (GLUCOPHAGE) 500 MG tablet, Take 2 tablets (1,000 mg total) by mouth 2 (two) times daily with a meal., Disp: 360 tablet, Rfl: 0 .  risperiDONE (RISPERDAL) 3 MG tablet, Take 0.5 tablets (1.5 mg total) by mouth 2 (two) times daily., Disp: 30 tablet, Rfl: 2 .  Zinc Oxide 13 % CREA, Apply topically to affected area several times per day., Disp: 113 g, Rfl: 1    Allergies  Allergen Reactions  . Morphine And Related Shortness Of Breath    Past Medical History:  Diagnosis Date  . Blind left eye   . Deaf   . Developmental delay   . Diabetes mellitus without complication ([place])   . Mental retardation      Past Surgical History:  Procedure Laterality Date  . None      ROS  Objective:   Vitals: BP 121/75 (BP Location: Right Arm)   Pulse 100   Temp 98.9 F  (37.2 C) (Oral)   Resp 18   SpO2 100%   Physical Exam Constitutional:      General: She is not in acute distress.    Appearance: Normal appearance. She is well-developed. She is not ill-appearing.  HENT:     Head: Normocephalic and atraumatic.     Nose: Nose normal.     Mouth/Throat:     Mouth: Mucous membranes are moist.     Pharynx: Oropharynx is clear.  Eyes:     General: No scleral icterus.    Extraocular Movements: Extraocular movements intact.     Pupils: Pupils are equal, round, and reactive to light.  Cardiovascular:     Rate and Rhythm: Normal rate.  Pulmonary:     Effort: Pulmonary effort is normal.  Musculoskeletal:     Right foot: Decreased range of motion (Mildly decreased). Normal capillary refill. Tenderness (Assume from patient having wincing on exam), bony tenderness and swelling (Over area depicted) present. No crepitus, deformity or laceration.       Feet:  Skin:    General: Skin is warm and dry.  Neurological:     General: No focal deficit present.     Mental Status: She is alert and oriented to person, place, and time.  Psychiatric:        Mood and Affect:  Mood normal.        Behavior: Behavior normal.     Dg Foot 2 Views Right  Result Date: 08/30/2018 CLINICAL DATA:  Right foot pain. EXAM: RIGHT FOOT - 2 VIEW COMPARISON:  None. FINDINGS: There is a nondisplaced fracture of the fifth metatarsal base with extension to the fifth TMT joint. No additional fracture identified. Pes planus. Joint space is relatively preserved. Mild soft tissue prominence over the dorsal lateral aspect of the midfoot and forefoot. IMPRESSION: Acute nondisplaced fracture of the fifth metatarsal base. Electronically Signed   By: Davina Poke M.D.   On: 08/30/2018 16:57    Assessment and Plan :   1. Closed fracture of base of fifth metatarsal bone of right foot, initial encounter   2. Right foot pain   3. Swelling of right foot     Counseled patient's mother on  management of nondisplaced fifth metatarsal fracture.  Will use postop shoe, naproxen.  Recommended she reach out to an orthopedist, list was provided to her.  Patient's mother requested a work note for 2 months that she can stay home with her daughter as she has disabilities.  I counseled that I am unable to do that as her employer would require FMLA and or short-term disability which our clinic is unable to do.  Recommended that she reach out to her primary care physician for this. Counseled patient on potential for adverse effects with medications prescribed/recommended today, ER and return-to-clinic precautions discussed, patient verbalized understanding.    Jaynee Eagles, [place] 08/30/18 1727

## 2018-09-06 IMAGING — DX DG ANKLE COMPLETE 3+V*R*
3 series · 3 of 3 positions shown · non-contrast
Comparison: None.

CLINICAL DATA: Acute onset of right ankle and foot swelling, after
rolling right foot. Initial encounter.

EXAM:
RIGHT ANKLE - COMPLETE 3+ VIEW

[x ankle lat right]
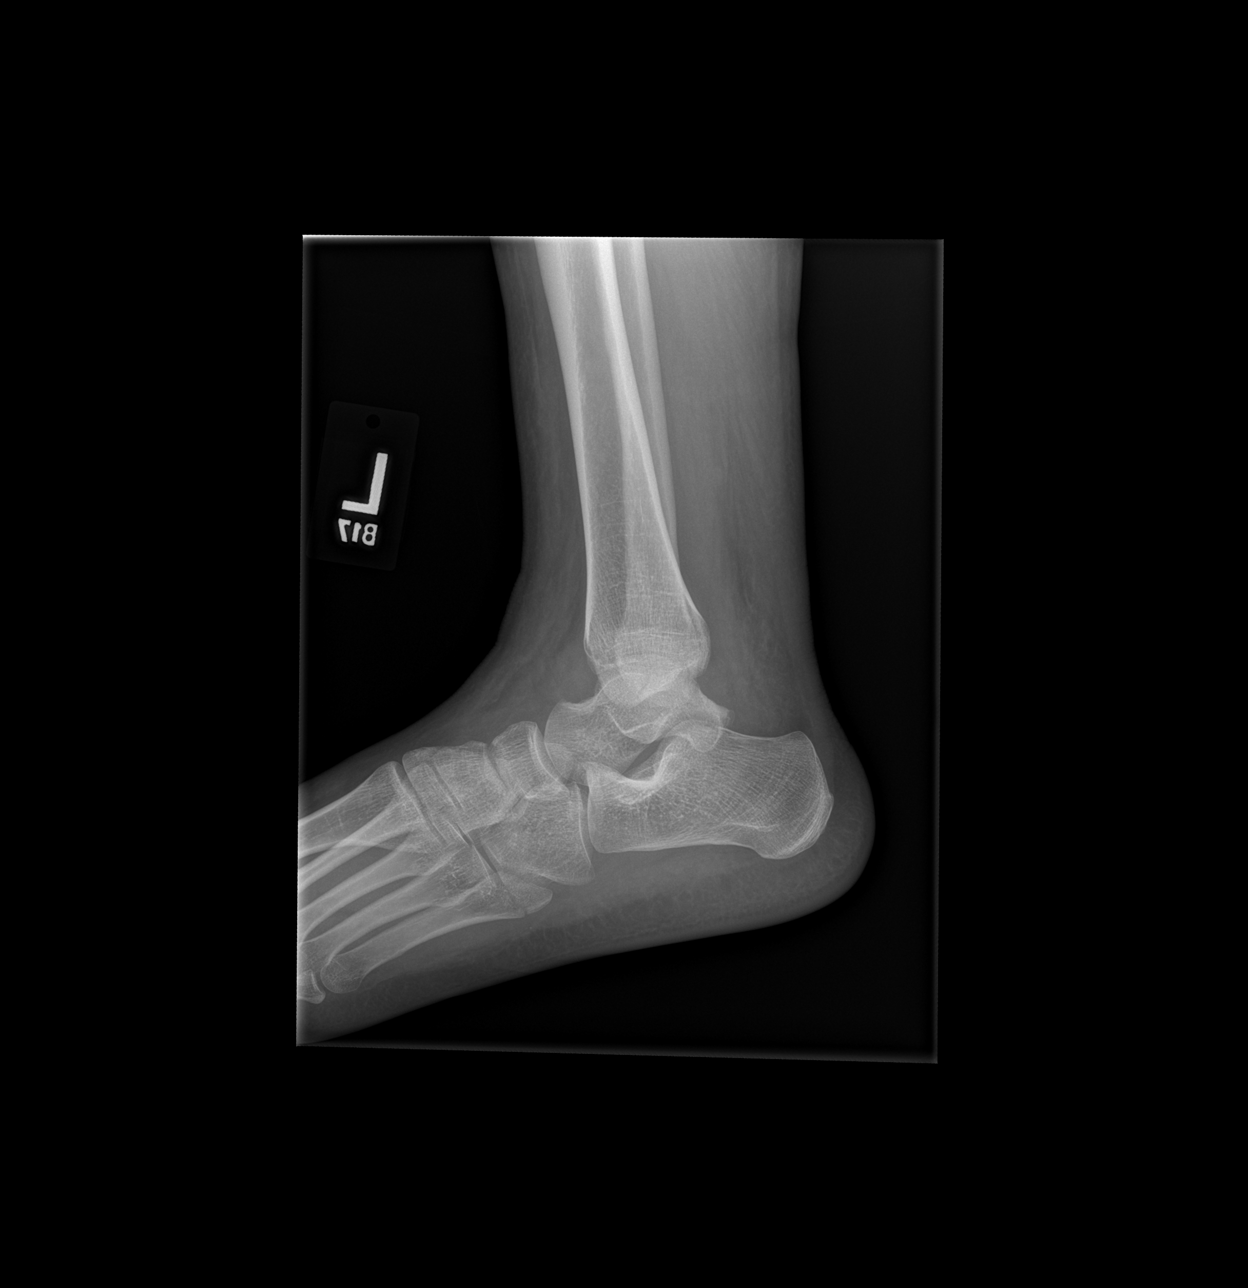

[x ankle ap right]
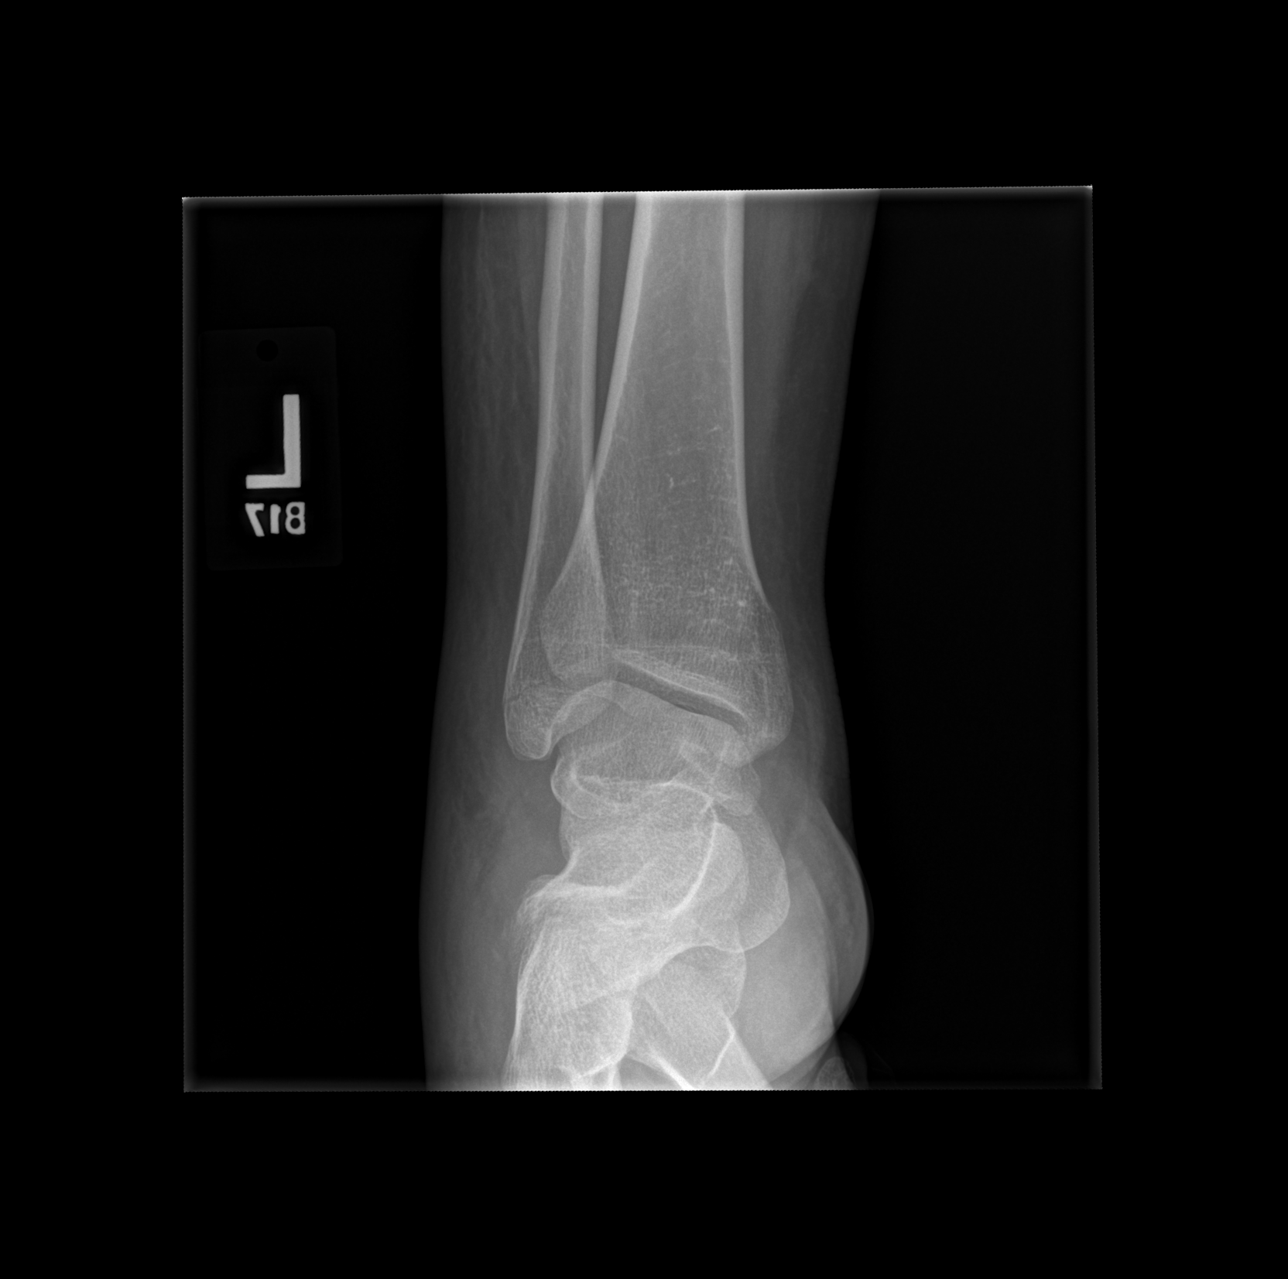

[x ankle obl right]
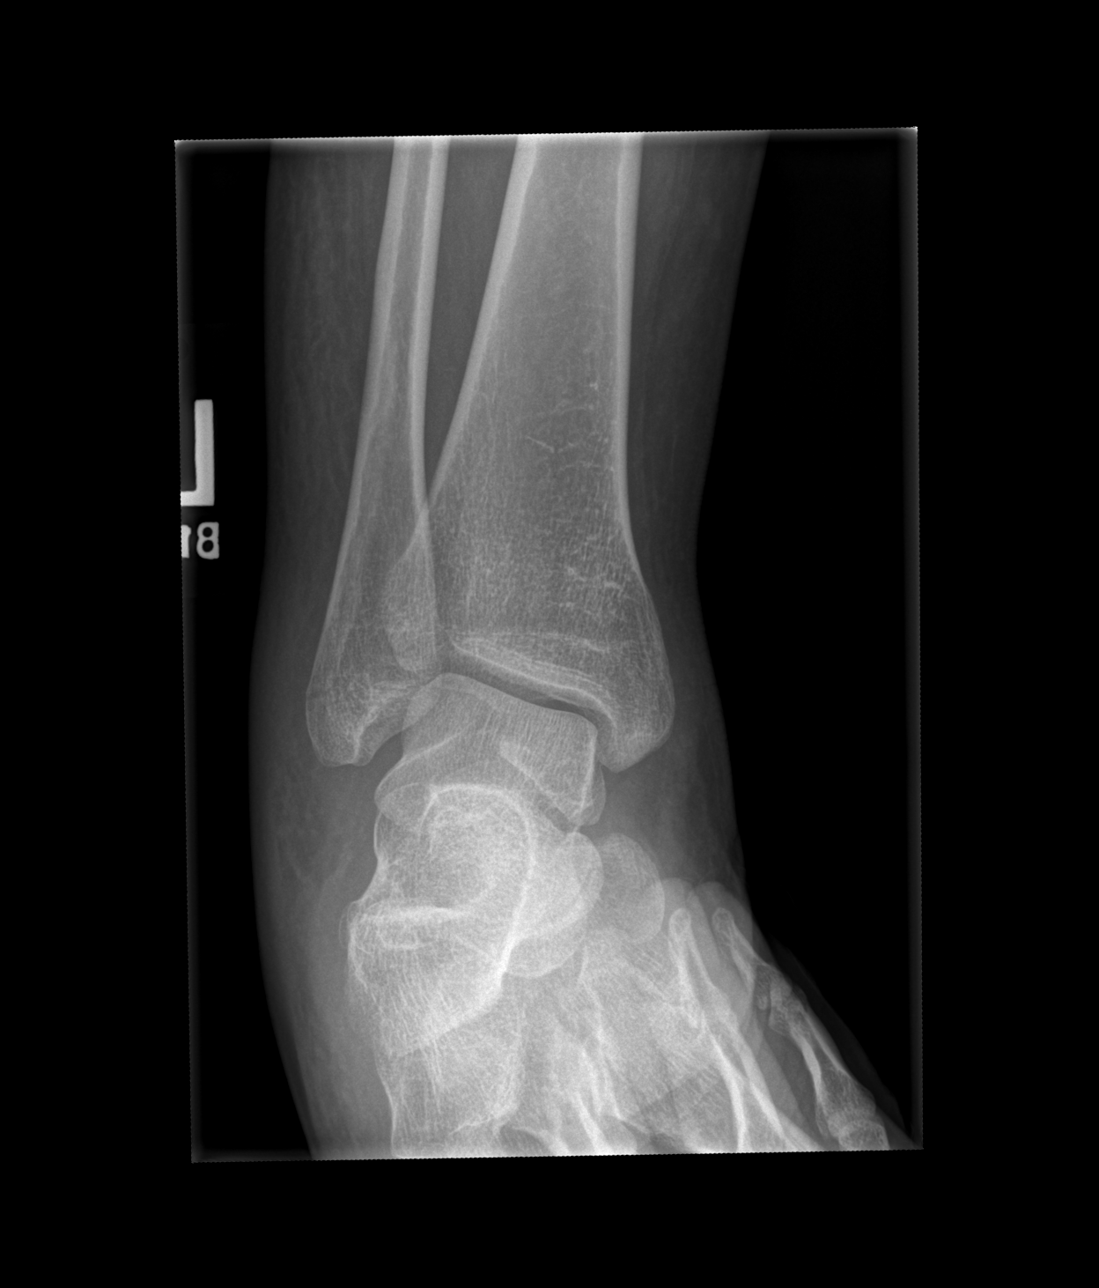

[3 of 3 positions shown; findings below may reference images not displayed]

FINDINGS: There is a minimally displaced avulsion fracture through the base of
the fifth metatarsal. The ankle mortise is intact; the interosseous
space is within normal limits. No talar tilt or subluxation is seen.

The joint spaces are preserved. No significant soft tissue
abnormalities are seen.
IMPRESSION: Minimally displaced avulsion fracture through the base of the fifth
metatarsal.

## 2018-09-06 IMAGING — DX DG FOOT COMPLETE 3+V*R*
3 series · 3 of 3 positions shown · non-contrast
Comparison: None.

CLINICAL DATA: Acute onset of right ankle and foot swelling, after
rolling right foot. Initial encounter.

EXAM:
RIGHT FOOT COMPLETE - 3+ VIEW

[x foot ap right]
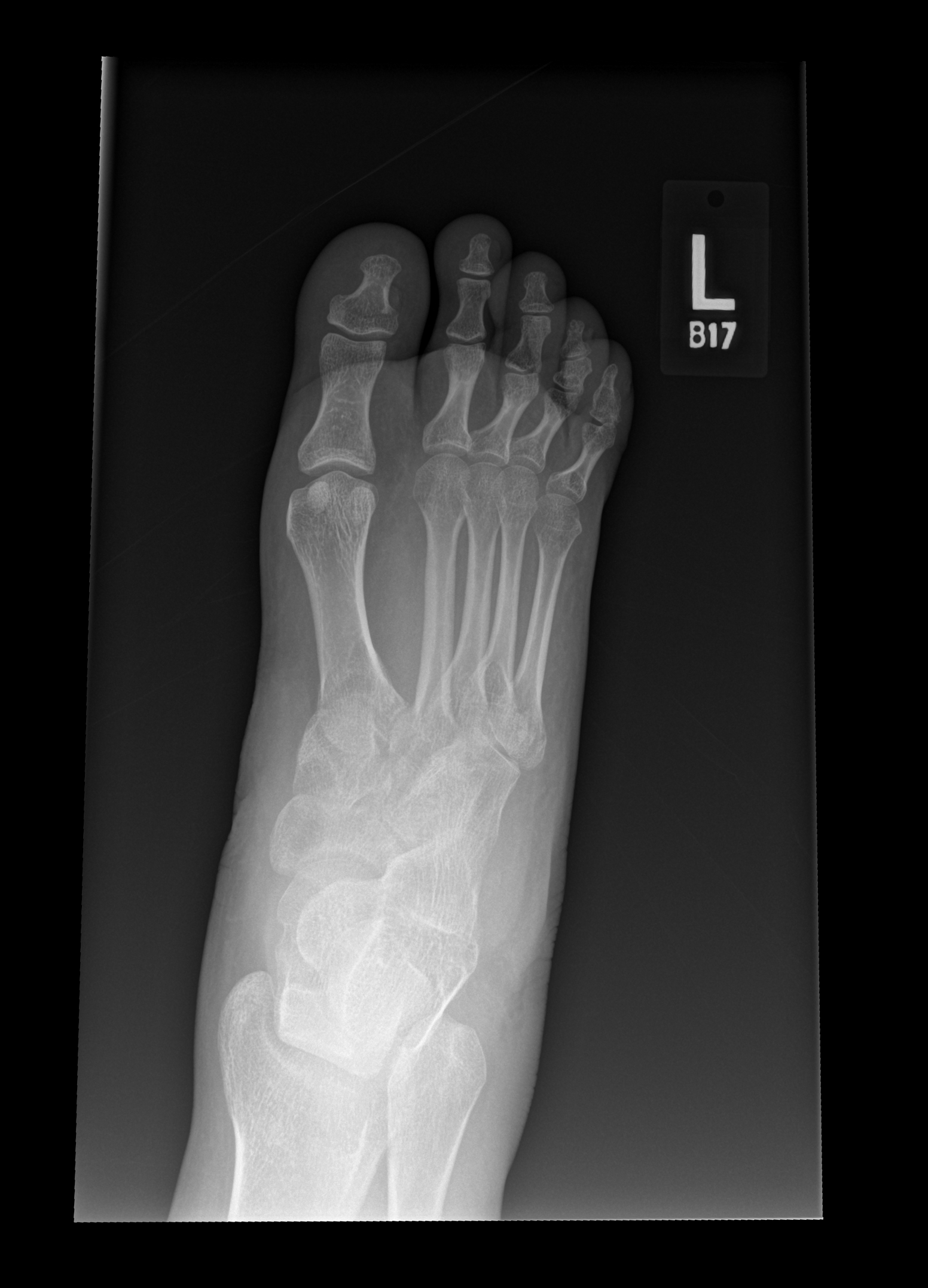

[x foot obl right]
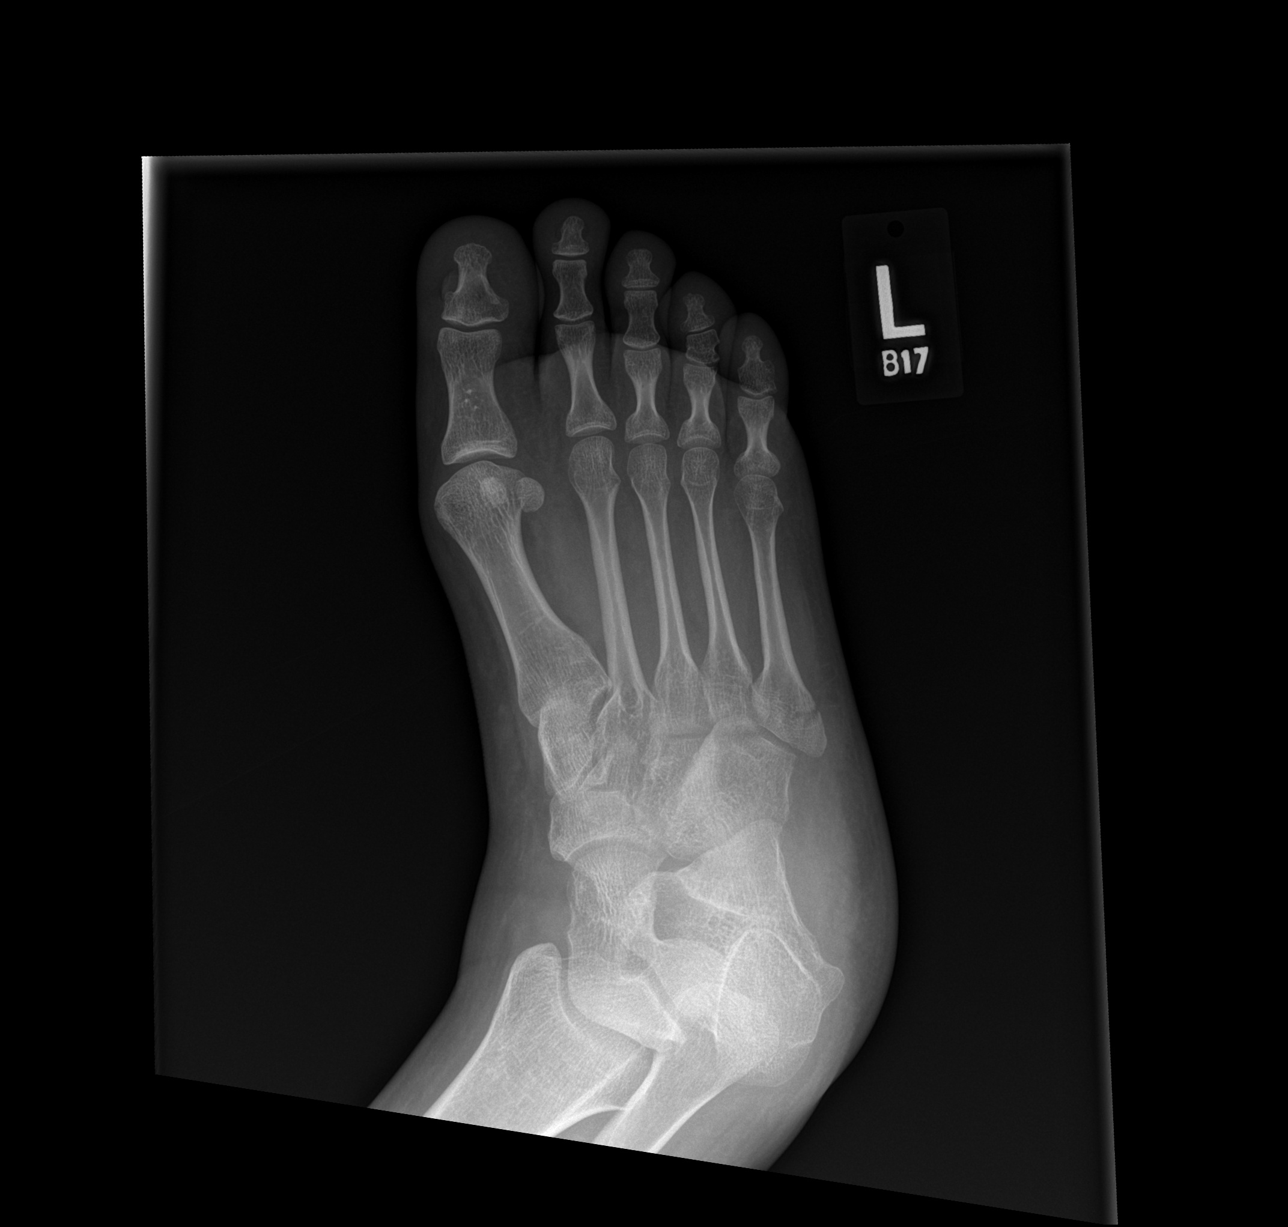

[x foot lat right]
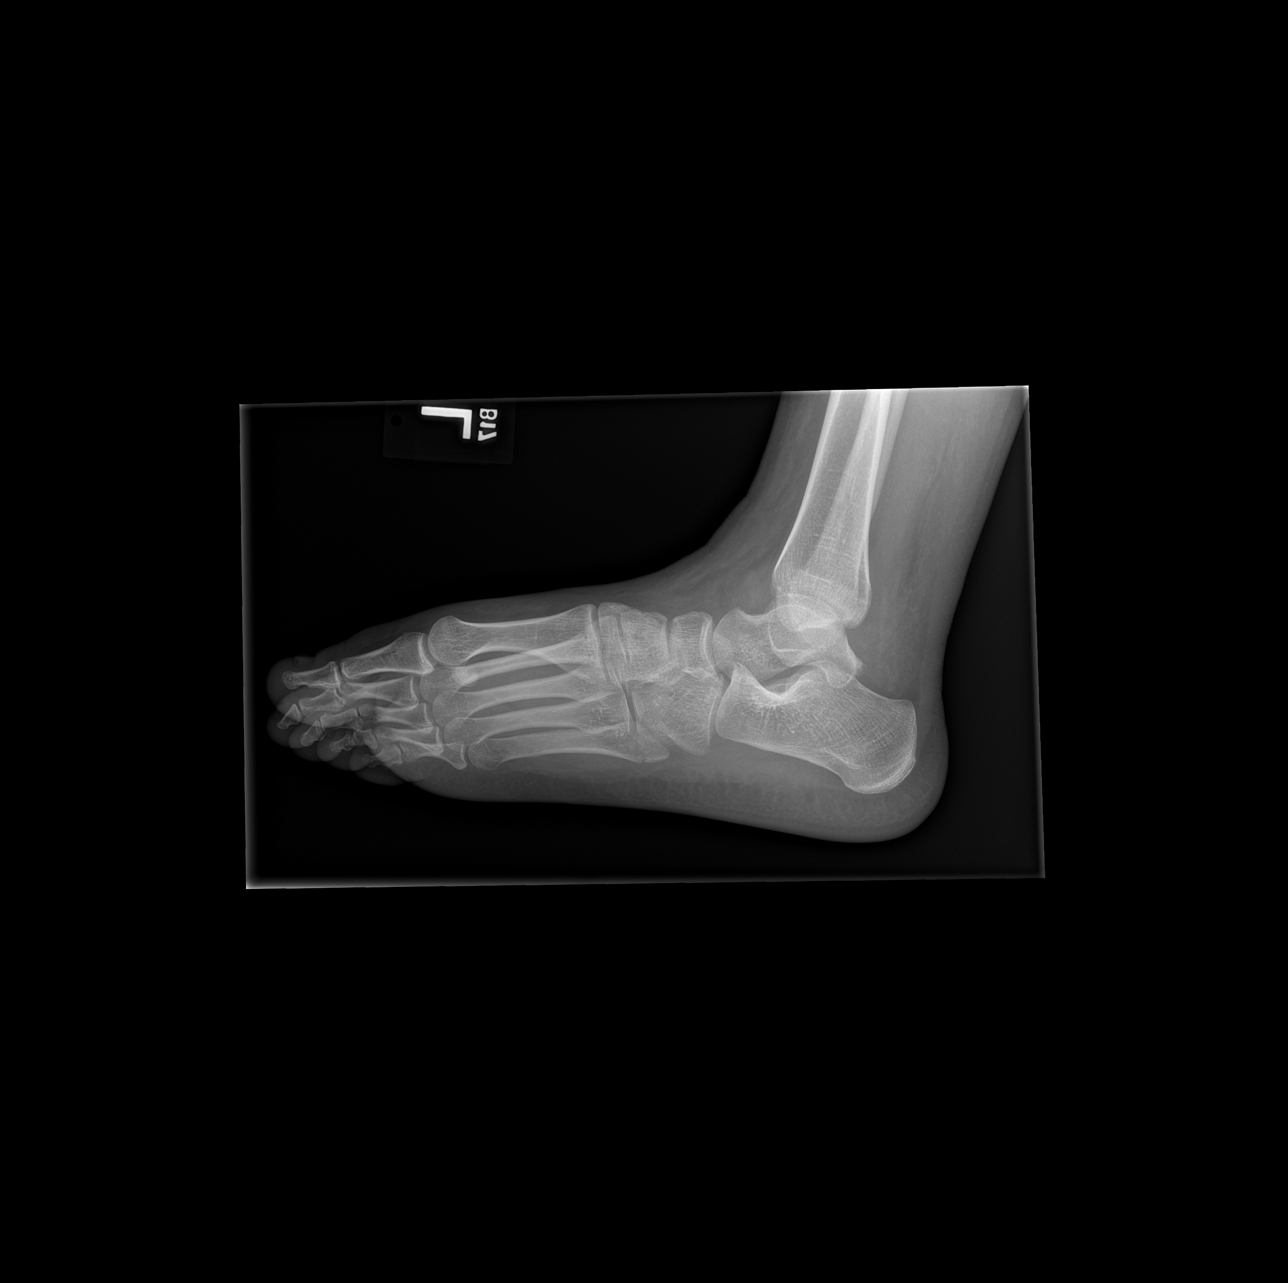

[3 of 3 positions shown; findings below may reference images not displayed]

FINDINGS: There is a minimally displaced avulsion fracture through the base of
the fifth metatarsal. No additional fractures are seen.

Visualized joint spaces are otherwise grossly preserved. The
subtalar joint is unremarkable. Mild soft tissue swelling is
suggested anterior to the ankle.
IMPRESSION: Minimally displaced avulsion fracture through the base of the fifth
metatarsal.

## 2018-09-07 ENCOUNTER — Encounter: Payer: Self-pay | Admitting: Family

## 2018-09-07 ENCOUNTER — Other Ambulatory Visit: Payer: Self-pay

## 2018-09-07 ENCOUNTER — Ambulatory Visit (INDEPENDENT_AMBULATORY_CARE_PROVIDER_SITE_OTHER): Payer: Medicaid Other | Admitting: Family

## 2018-09-07 VITALS — Ht 59.0 in | Wt 145.0 lb

## 2018-09-07 DIAGNOSIS — S92351A Displaced fracture of fifth metatarsal bone, right foot, initial encounter for closed fracture: Secondary | ICD-10-CM | POA: Diagnosis not present

## 2018-09-07 NOTE — Progress Notes (Signed)
Office Visit Note   Patient: Makayla Roberts           Date of Birth: Nov 12, 1985           MRN: [id number] Visit Date: 09/07/2018              Requested by: Gildardo Pounds, NP [address] [place],  [place] [address] PCP: Gildardo Pounds, NP  Chief Complaint  Patient presents with  . Right Foot - Pain    5th MT fracture.       HPI: The patient is a 33 year old woman who presents today accompanied by her mother as well as an interpreter.  She is seen for a nondisplaced fracture of her fifth metatarsal at the base.  She was seen 1 week ago at urgent care for the same and was placed in a postop shoe  Her history is significant for MR, she is deaf and blind in her left eye she also has diabetes.  She has been taking naproxen for pain.  Her mother reports that she fell and twisted her right foot.  Her mother also reports she has a history of fracture of the right foot as well unfortunately is not able to provide history today.  Her mother is quite concerned that she may have weak or soft bones as she has a history of 3 fractures to her lower extremities now.  Assessment & Plan: Visit Diagnoses:  1. Closed fracture of base of fifth metatarsal bone of right foot     Plan: Placed in a cam walker as the patient continues to remove the postop shoe and this was not working well for the patient.  She will ambulate as tolerated in the cam walker.  We will draw some lab work today and check a vitamin D level.  She will follow-up in the office in 2 more weeks with radiographs of her right foot  Follow-Up Instructions: Return in about 2 weeks (around 09/21/2018).   Ortho Exam  Patient is alert, oriented, no adenopathy, well-dressed, normal affect, normal respiratory effort. On examination of right foot. Pes planus. Does not resist exam, no response to palpation of base of 5th MT. Normothermic, palpable DP pulse.  Imaging: No results found. No images are attached to the encounter.   Labs: Lab Results  Component Value Date   HGBA1C 7.5 (A) 02/28/2018   HGBA1C 6.6 (A) 08/16/2017   HGBA1C 7.1 03/03/2017   REPTSTATUS 03/04/2014 FINAL 03/02/2014   CULT  03/02/2014    No Beta Hemolytic Streptococci Isolated Performed at [facility] Normal Upper Respiratory Flora 09/14/2014   LABORGA No Beta Hemolytic Streptococci Isolated 09/14/2014     Lab Results  Component Value Date   ALBUMIN 4.6 08/16/2017   ALBUMIN 4.3 01/23/2016   ALBUMIN 4.0 03/02/2014    No results found for: MG Lab Results  Component Value Date   VD25OH <10 (L) 01/11/2013    No results found for: PREALBUMIN CBC EXTENDED Latest Ref Rng & Units 08/16/2017 01/23/2016 12/18/2014  WBC 3.4 - 10.8 x10E3/uL 8.5 7.3 8.5  RBC 3.77 - 5.28 x10E6/uL 4.66 4.66 4.75  HGB 11.1 - 15.9 g/dL 14.2 14.3 14.7  HCT 34.0 - 46.6 % 42.8 41.7 41.3  PLT 150 - 450 x10E3/uL 280 264 261  NEUTROABS 1,500 - 7,800 cells/uL - 3,869 -  LYMPHSABS 850 - 3,900 cells/uL - 3,066 -     Body mass index is 29.29 kg/m.  Orders:  No  orders of the defined types were placed in this encounter.  No orders of the defined types were placed in this encounter.    Procedures: No procedures performed  Clinical Data: No additional findings.  ROS:  All other systems negative, except as noted in the HPI. Review of Systems  Objective: Vital Signs: Ht 4\' 11"  (1.499 m)   Wt 145 lb (65.8 kg)   BMI 29.29 kg/m   Specialty Comments:  No specialty comments available.  PMFS History: Patient Active Problem List   Diagnosis Date Noted  . Sorethroat 03/03/2017  . Itchy eyes 08/12/2016  . Polyphagia 08/12/2016  . Behavior problem, adult 05/12/2016  . School physical exam 06/20/2015  . Type 2 diabetes mellitus without complication, without long-term current use of insulin ([facility]) 06/20/2015  . Intellectual disability 12/27/2014  . Tachycardia with hypertension 12/27/2014  . Right ear impacted cerumen 09/14/2014  .  Upper respiratory infection 09/14/2014  . Sinus tachycardia 09/14/2014  . Type 2 diabetes mellitus without complication ([facility]) 08/23/2014  . Redness of eye, left 08/23/2014  . Other specified diabetes mellitus without complications ([facility]) 04/19/2014  . Allergic rhinoconjunctivitis of left eye 03/15/2014  . Hypovitaminosis D 03/02/2013  . Unspecified hypothyroidism 03/02/2013  . Essential hypertension, benign 03/02/2013  . Blindness of left eye 01/11/2013  . Impaired speech 01/11/2013  . Developmental disability 01/11/2013   Past Medical History:  Diagnosis Date  . Blind left eye   . Deaf   . Developmental delay   . Diabetes mellitus without complication ([facility])   . Mental retardation     Family History  Problem Relation Age of Onset  . Hypertension Mother   . Arthritis Mother   . Heart disease Mother   . Diabetes Father   . Hypertension Father     Past Surgical History:  Procedure Laterality Date  . None     Social History   Occupational History  . Not on file  Tobacco Use  . Smoking status: Never Smoker  . Smokeless tobacco: Never Used  Substance and Sexual Activity  . Alcohol use: No  . Drug use: No  . Sexual activity: Never

## 2018-09-07 NOTE — Addendum Note (Signed)
Addended by: Pamella Pert on: 09/07/2018 02:06 PM   Modules accepted: Orders

## 2018-09-11 LAB — VITAMIN D 1,25 DIHYDROXY
Vitamin D 1, 25 (OH)2 Total: 38 pg/mL (ref 18–72)
Vitamin D2 1, 25 (OH)2: <8 pg/mL
Vitamin D3 1, 25 (OH)2: 38 pg/mL

## 2018-09-11 LAB — EXTRA LAV TOP TUBE

## 2018-09-13 NOTE — Progress Notes (Signed)
Do you mind calling and let know vit d normal,

## 2018-09-20 ENCOUNTER — Ambulatory Visit (INDEPENDENT_AMBULATORY_CARE_PROVIDER_SITE_OTHER): Payer: Medicaid Other | Admitting: Family

## 2018-09-20 ENCOUNTER — Encounter: Payer: Self-pay | Admitting: Family

## 2018-09-20 ENCOUNTER — Ambulatory Visit (INDEPENDENT_AMBULATORY_CARE_PROVIDER_SITE_OTHER): Payer: Medicaid Other

## 2018-09-20 VITALS — Ht 59.0 in | Wt 145.0 lb

## 2018-09-20 DIAGNOSIS — S92351A Displaced fracture of fifth metatarsal bone, right foot, initial encounter for closed fracture: Secondary | ICD-10-CM | POA: Diagnosis not present

## 2018-09-20 NOTE — Progress Notes (Signed)
Office Visit Note   Patient: Makayla Roberts           Date of Birth: [birth year]           MRN: [id number] Visit Date: 09/20/2018              Requested by: Claiborne RiggFleming, Zelda W, NP [address] [place],  [place] [id number] PCP: Claiborne RiggFleming, Zelda W, NP  Chief Complaint  Patient presents with  . Right Foot - Follow-up      HPI: The patient is a 33 year old woman presents today in follow-up for nondisplaced fracture to the base of her fifth metatarsal.  She is accompanied by her mother as well as an interpreter.    She is about 3 weeks out from a fracture.  Today she is in her sneakers mother reports she has had no difficulty ambulating in these.  No observations of behavior that suggest she has been having any pain.    Reports she did wear the cam walker for about a week.  Her history is significant for MR, she is deaf and blind in her left eye she also has diabetes.  She has been taking naproxen for pain.    Assessment & Plan: Visit Diagnoses:  1. Closed fracture of base of fifth metatarsal bone of right foot     Plan: May continue to advance her weightbearing as tolerated. Follow-Up Instructions: Return if symptoms worsen or fail to improve.   Ortho Exam  Patient is alert, oriented, no adenopathy, well-dressed, normal affect, normal respiratory effort. On examination of right foot. Pes planus. Does not resist exam, is nontender to palpation of base of 5th MT. Normothermic, palpable DP pulse.  Imaging: No results found. No images are attached to the encounter.  Labs: Lab Results  Component Value Date   HGBA1C 7.5 (A) 02/28/2018   HGBA1C 6.6 (A) 08/16/2017   HGBA1C 7.1 03/03/2017   REPTSTATUS 03/04/2014 FINAL 03/02/2014   CULT  03/02/2014    No Beta Hemolytic Streptococci Isolated [facility]formed [facility]    LABORGA Normal Upper Respiratory Flora 09/14/2014   LABORGA No Beta Hemolytic Streptococci Isolated 09/14/2014     Lab Results  Component Value Date    ALBUMIN 4.6 08/16/2017   ALBUMIN 4.3 01/23/2016   ALBUMIN 4.0 03/02/2014    No results found for: MG Lab Results  Component Value Date   VD25OH <10 (L) 01/11/2013    No results found for: PREALBUMIN CBC EXTENDED Latest Ref Rng & Units 08/16/2017 01/23/2016 12/18/2014  WBC 3.4 - 10.8 x10E3/uL 8.5 7.3 8.5  RBC 3.77 - 5.28 x10E6/uL 4.66 4.66 4.75  HGB 11.1 - 15.9 g/dL 19.114.2 47.814.3 29.514.7  HCT 62.134.0 - 46.6 % 42.8 41.7 41.3  PLT 150 - 450 x10E3/uL 280 264 261  NEUTROABS 1,500 - 7,800 cells/uL - 3,869 -  LYMPHSABS 850 - 3,900 cells/uL - 3,066 -     Body mass index is 29.29 kg/m.  Orders:  Orders Placed This Encounter  Procedures  . XR Foot Complete Right   No orders of the defined types were placed in this encounter.    Procedures: No procedures performed  Clinical Data: No additional findings.  ROS:  All other systems negative, except as noted in the HPI. Review of Systems  Constitutional: Negative for chills and fever.  Skin: Positive for wound.    Objective: Vital Signs: Ht 4\' 11"  (1.499 m)   Wt 145 lb (65.8 kg)   BMI 29.29 kg/m  Specialty Comments:  No specialty comments available.  PMFS History: Patient Active Problem List   Diagnosis Date Noted  . Sorethroat 03/03/2017  . Itchy eyes 08/12/2016  . Polyphagia 08/12/2016  . Behavior problem, adult 05/12/2016  . School physical exam 06/20/2015  . Type 2 diabetes mellitus without complication, without long-term current use of insulin ([place]) 06/20/2015  . Intellectual disability 12/27/2014  . Tachycardia with hypertension 12/27/2014  . Right ear impacted cerumen 09/14/2014  . Upper respiratory infection 09/14/2014  . Sinus tachycardia 09/14/2014  . Type 2 diabetes mellitus without complication ([place]) 56/38/9373  . Redness of eye, left 08/23/2014  . Other specified diabetes mellitus without complications ([place] 42/87/6811  . Allergic rhinoconjunctivitis of left eye 03/15/2014  . Hypovitaminosis D 03/02/2013   . Unspecified hypothyroidism 03/02/2013  . Essential hypertension, benign 03/02/2013  . Blindness of left eye 01/11/2013  . Impaired speech 01/11/2013  . Developmental disability 01/11/2013   Past Medical History:  Diagnosis Date  . Blind left eye   . Deaf   . Developmental delay   . Diabetes mellitus without complication ([place])   . Mental retardation     Family History  Problem Relation Age of Onset  . Hypertension Mother   . Arthritis Mother   . Heart disease Mother   . Diabetes Father   . Hypertension Father     Past Surgical History:  Procedure Laterality Date  . None     Social History   Occupational History  . Not on file  Tobacco Use  . Smoking status: Never Smoker  . Smokeless tobacco: Never Used  Substance and Sexual Activity  . Alcohol use: No  . Drug use: No  . Sexual activity: Never

## 2018-09-21 ENCOUNTER — Ambulatory Visit: Payer: Medicaid Other | Admitting: Family

## 2018-09-23 MED FILL — metFORMIN HCL 500 MG TABS: 500 | 30 days supply | Qty: 120 | Fill #1

## 2018-09-23 MED FILL — ACCU-CHEK GUIDE TEST STRIP: 30 days supply | Qty: 100 | Fill #5

## 2018-09-26 ENCOUNTER — Other Ambulatory Visit: Payer: Self-pay | Admitting: Neurology

## 2018-10-05 ENCOUNTER — Telehealth: Payer: Self-pay | Admitting: *Deleted

## 2018-10-05 NOTE — Telephone Encounter (Signed)
Patient and caregiver stopped by the office.  Appt was scheduled for Sep 3 at 7:30AM.  She needs a medication refilled but they do not know the name.  She is out.  Please send to [facility].

## 2018-10-05 NOTE — Telephone Encounter (Signed)
Pt last seen on 05/17/2017.  Pending appt on 10/20/2018 with Dr. Krista Blue.  Last rx sent from our office was for risperidone 3mg , 0.5 tab BID (on 06/07/2018 x 2 refills). According to our records, he would have been out of medication by now. We need to make sure of which medication they are referring to for refills. Left messgae requesting a call back.  Refills will be considered, once confirmation of request has been obtained.

## 2018-10-06 NOTE — Telephone Encounter (Signed)
The patient's appt has been moved to 10/11/2018 for his yearly follow up and to discuss refills of Risperdal.

## 2018-10-10 NOTE — Progress Notes (Deleted)
PATIENT: Makayla Roberts DOB: [birth year]  REASON FOR VISIT: follow up HISTORY FROM: patient  HISTORY OF PRESENT ILLNESS: Today 10/10/18  HISTORY HISTORY 03/14/13 (YAN):: immigrated from [place] 3 months ago, accompanied by her mother, interpreter, and a Education officer, museum at today's clinical visit  Mother had home delivery at [place] 29 years ago, it was a prolonged difficult delivery, she was blue and small when she was born, she was delayed in reaching her developmental milestones, began to walk at 33 years old, when she was 33 years old, physician at [place] told her mother that part of her brain was missing  she never went to school, developmentally delayed, was not able to speak, tends to have behavior issues  Since she moved to [place] 3 months ago, living in apartment with her mother, she no longer have other relatives to help taking care of her,  Mother felt overwhelming, patient has gained a lot of weight also become agitated easily, she eats everything that she can get her hands on,  Sometimes she is out of control, began developing hair out of her head, also be violent towards her mother, she has difficulty sleeping,  There was no history of seizure, mother has 3 other children, that is all healthy  07/26/2014 CM: Ms. Nappi is a 33 year old female with a history of mental retardation, impaired speech and behavioral issues. The patient returns today for followup and interpreter is present. She is currently taken Risperdal and mom reports that it is working well for her. She states that the patient is eating a lot. Blood sugars have been elevated, today's reading was 197. Family state that she is doing better since starting school. She goes to school 3 days a week and stays there for 8 hours. She needs refills on her medication 01/28/2015 CM: Ms. Christon is a 33 year old female with a history of mental retardation, impaired speech and behavioral issues. The patient returns today for followup  and interpreter is present. She is currently taken Risperdal and mom reports that it is working well for her.  Family state that she is doing better since starting adult day care.  She goes  3 days a week and stays there for 8 hours. She returns for reevaluation. 05/12/2016 CM: Ms. Vasques is a 33 year old female with a history of mental retardation, impaired speech and behavioral issues. The patient returns today for followup and interpreter is present. She is currently taken Risperdal and mom reports that it is working well for her.  She ran out of her medicine and has missed one dose, she does not sleep well without this according to . Family state that she is doing better since starting adult day care.  She goes  3 days a week and stays there for 8 hours. According to the mom she loves going to the day care. She has been diagnosed with diabetes since last seen. She also has seasonal allergies. She has been unable to get a CT of the head in the past due to not being cooperative even with medication. She returns for reevaluation. 05/17/2017 CM: Ms. Stumpo is a 33 year old female with a history of mental retardation, impaired speech and behavioral issues. The patient returns today for followup and interpreter is present. She is currently off Risperdal as she ran out and was told by the pharmacy that no refills remained.  Since she has been off of the medication she has had more behavior issues hitting the mom jumping around and hitting herself  and these are happening daily.  She has been well controlled on Risperdal in the past.  Mom also reports that blood sugars range from 150-170 and her appetite is good.  She still goes to daycare 5 days a week . She also has seasonal allergies. She has been unable to get a CT of the head in the past due to not being cooperative even with medication. She returns for reevaluation     REVIEW OF SYSTEMS: Out of a complete 14 system review of symptoms, the patient complains  only of the following symptoms, and all other reviewed systems are negative.  ALLERGIES: Allergies  Allergen Reactions  . Morphine And Related Shortness Of Breath    HOME MEDICATIONS: Outpatient Medications Prior to Visit  Medication Sig Dispense Refill  . ACCU-CHEK FASTCLIX LANCETS MISC 1 kit by Does not apply route daily. Use to check blood sugar up to 3 times daily. 102 each 11  . Blood Glucose Monitoring Suppl (ACCU-CHEK GUIDE) w/Device KIT 1 kit by Does not apply route daily. Use to check blood sugar up to 3 times daily. 1 kit 0  . glucose blood (ACCU-CHEK GUIDE) test strip Use as instructed to check blood sugar up to 3 times daily. 100 each 11  . lisinopril (PRINIVIL,ZESTRIL) 5 MG tablet Take 1 tablet (5 mg total) by mouth daily. 90 tablet 3  . metFORMIN (GLUCOPHAGE) 500 MG tablet Take 2 tablets (1,000 mg total) by mouth 2 (two) times daily with a meal. 360 tablet 0  . naproxen (NAPROSYN) 500 MG tablet Take 1 tablet (500 mg total) by mouth 2 (two) times daily. 30 tablet 0  . risperiDONE (RISPERDAL) 3 MG tablet Take 0.5 tablets (1.5 mg total) by mouth 2 (two) times daily. 30 tablet 2  . Zinc Oxide 13 % CREA Apply topically to affected area several times per day. 113 g 1   No facility-administered medications prior to visit.     PAST MEDICAL HISTORY: Past Medical History:  Diagnosis Date  . Blind left eye   . Deaf   . Developmental delay   . Diabetes mellitus without complication ([place])   . Mental retardation     PAST SURGICAL HISTORY: Past Surgical History:  Procedure Laterality Date  . None      FAMILY HISTORY: Family History  Problem Relation Age of Onset  . Hypertension Mother   . Arthritis Mother   . Heart disease Mother   . Diabetes Father   . Hypertension Father     SOCIAL HISTORY: Social History   Socioeconomic History  . Marital status: Single    Spouse name: Not on file  . Number of children: Not on file  . Years of education: Not on file  .  Highest education level: Not on file  Occupational History  . Not on file  Social Needs  . Financial resource strain: Not on file  . Food insecurity    Worry: Not on file    Inability: Not on file  . Transportation needs    Medical: Not on file    Non-medical: Not on file  Tobacco Use  . Smoking status: Never Smoker  . Smokeless tobacco: Never Used  Substance and Sexual Activity  . Alcohol use: No  . Drug use: No  . Sexual activity: Never  Lifestyle  . Physical activity    Days per week: Not on file    Minutes per session: Not on file  . Stress: Not on file  Relationships  .  Social Herbalist on phone: Not on file    Gets together: Not on file    Attends religious service: Not on file    Active member of club or organization: Not on file    Attends meetings of clubs or organizations: Not on file    Relationship status: Not on file  . Intimate partner violence    Fear of current or ex partner: Not on file    Emotionally abused: Not on file    Physically abused: Not on file    Forced sexual activity: Not on file  Other Topics Concern  . Not on file  Social History Narrative   ** Merged History Encounter **       Patient lives at home with her mom (Zenind). Education- None Left handed. Caffeine- Some hot tea  Patient Special Coordinator Terrilee Croak - [phone] -cell, [phone] and fax [phone].      PHYSICAL EXAM  There were no vitals filed for this visit. There is no height or weight on file to calculate BMI.  Generalized: Well developed, in no acute distress   Neurological examination  Mentation: Alert oriented to time, place, history taking. Follows all commands speech and language fluent Cranial nerve II-XII: Pupils were equal round reactive to light. Extraocular movements were full, visual field were full on confrontational test. Facial sensation and strength were normal. Uvula tongue midline. Head turning and shoulder shrug  were  normal and symmetric. Motor: The motor testing reveals 5 over 5 strength of all 4 extremities. Good symmetric motor tone is noted throughout.  Sensory: Sensory testing is intact to soft touch on all 4 extremities. No evidence of extinction is noted.  Coordination: Cerebellar testing reveals good finger-nose-finger and heel-to-shin bilaterally.  Gait and station: Gait is normal. Tandem gait is normal. Romberg is negative. No drift is seen.  Reflexes: Deep tendon reflexes are symmetric and normal bilaterally.   DIAGNOSTIC DATA (LABS, IMAGING, TESTING) - I reviewed patient records, labs, notes, testing and imaging myself where available.  Lab Results  Component Value Date   WBC 8.5 08/16/2017   HGB 14.2 08/16/2017   HCT 42.8 08/16/2017   MCV 92 08/16/2017   PLT 280 08/16/2017      Component Value Date/Time   NA 140 08/16/2017 1641   K 4.3 08/16/2017 1641   CL 103 08/16/2017 1641   CO2 25 08/16/2017 1641   GLUCOSE 136 (H) 08/16/2017 1641   GLUCOSE 180 (H) 01/23/2016 1642   BUN 7 08/16/2017 1641   CREATININE 0.45 (L) 08/16/2017 1641   CREATININE 0.83 01/23/2016 1642   CALCIUM 9.8 08/16/2017 1641   PROT 7.2 08/16/2017 1641   ALBUMIN 4.6 08/16/2017 1641   AST 10 08/16/2017 1641   ALT 15 08/16/2017 1641   ALKPHOS 54 08/16/2017 1641   BILITOT <0.2 08/16/2017 1641   GFRNONAA 132 08/16/2017 1641   GFRNONAA >89 01/23/2016 1642   GFRAA 152 08/16/2017 1641   GFRAA >89 01/23/2016 1642   Lab Results  Component Value Date   CHOL 103 08/16/2017   HDL 48 08/16/2017   LDLCALC 27 08/16/2017   TRIG 142 08/16/2017   CHOLHDL 2.1 08/16/2017   Lab Results  Component Value Date   HGBA1C 7.5 (A) 02/28/2018   No results found for: [id number] Lab Results  Component Value Date   TSH 3.08 01/23/2016      ASSESSMENT AND PLAN 33 y.o. year old female  has a past medical  history of Blind left eye, Deaf, Developmental delay, Diabetes mellitus without complication ([place]), and Mental  retardation. here with:  1.  Mental retardation with developmental delays 2.  Behavioral issues   I spent 15 minutes with the patient. 50% of this time was spent   Butler Denmark, [place], [place] 10/10/2018, 8:24 PM [facility] [address], [place], [place] [address] [phone]

## 2018-10-11 ENCOUNTER — Telehealth: Payer: Self-pay

## 2018-10-11 ENCOUNTER — Encounter: Payer: Self-pay | Admitting: Neurology

## 2018-10-11 ENCOUNTER — Ambulatory Visit: Payer: Self-pay | Admitting: Neurology

## 2018-10-11 NOTE — Telephone Encounter (Signed)
Patient was a no call/no show for their appointment today.   

## 2018-10-20 ENCOUNTER — Ambulatory Visit: Payer: Medicaid Other | Admitting: Neurology

## 2018-10-20 ENCOUNTER — Ambulatory Visit (INDEPENDENT_AMBULATORY_CARE_PROVIDER_SITE_OTHER): Payer: Medicaid Other | Admitting: Neurology

## 2018-10-20 ENCOUNTER — Other Ambulatory Visit: Payer: Self-pay

## 2018-10-20 ENCOUNTER — Encounter: Payer: Self-pay | Admitting: Adult Health

## 2018-10-20 ENCOUNTER — Ambulatory Visit: Payer: Medicaid Other | Admitting: Adult Health

## 2018-10-20 VITALS — BP 130/95 | HR 122 | Temp 99.7°F | Ht 59.0 in | Wt 139.8 lb

## 2018-10-20 DIAGNOSIS — F89 Unspecified disorder of psychological development: Secondary | ICD-10-CM | POA: Diagnosis not present

## 2018-10-20 DIAGNOSIS — F69 Unspecified disorder of adult personality and behavior: Secondary | ICD-10-CM | POA: Diagnosis not present

## 2018-10-20 MED ORDER — RISPERIDONE 3 MG PO TABS: 1.5000 mg | ORAL_TABLET | Freq: Two times a day (BID) | ORAL | 5 refills | Status: DC

## 2018-10-20 NOTE — Progress Notes (Signed)
PATIENT: Makayla Roberts DOB: 04-23-1985  REASON FOR VISIT: follow up HISTORY FROM: patient  HISTORY OF PRESENT ILLNESS: Today 10/20/18:  Makayla Roberts is a 33 year old female with a history of mental retardation and behavioral issues.  She returns today for follow-up.  She is with her mother today.  We are using the interpreter language line.  The patient ran out of her medication.  Mom reports that she has been having behavioral issues.  States that she screams and jumps around when taken outside the home especially if mom is not with her.  She denies any other symptoms.  She returns today for evaluation.  HISTORY Makayla Roberts is a 33 year old female with a history of mental retardation, impaired speech and behavioral issues. The patient returns today for followup and interpreter is present. She is currently off Risperdal as she ran out and was told by the pharmacy that no refills remained.  Since she has been off of the medication she has had more behavior issues hitting the mom jumping around and hitting herself and these are happening daily.  She has been well controlled on Risperdal in the past.  Mom also reports that blood sugars range from 150-170 and her appetite is good.  She still goes to daycare 5 days a week . She also has seasonal allergies. She has been unable to get a CT of the head in the past due to not being cooperative even with medication. She returns for reevaluation  REVIEW OF SYSTEMS: Out of a complete 14 system review of symptoms, the patient complains only of the following symptoms, and all other reviewed systems are negative.  See HPI  ALLERGIES: Allergies  Allergen Reactions  . Morphine And Related Shortness Of Breath    HOME MEDICATIONS: Outpatient Medications Prior to Visit  Medication Sig Dispense Refill  . ACCU-CHEK FASTCLIX LANCETS MISC 1 kit by Does not apply route daily. Use to check blood sugar up to 3 times daily. 102 each 11  . Blood Glucose Monitoring  Suppl (ACCU-CHEK GUIDE) w/Device KIT 1 kit by Does not apply route daily. Use to check blood sugar up to 3 times daily. 1 kit 0  . glucose blood (ACCU-CHEK GUIDE) test strip Use as instructed to check blood sugar up to 3 times daily. 100 each 11  . lisinopril (PRINIVIL,ZESTRIL) 5 MG tablet Take 1 tablet (5 mg total) by mouth daily. 90 tablet 3  . metFORMIN (GLUCOPHAGE) 500 MG tablet Take 2 tablets (1,000 mg total) by mouth 2 (two) times daily with a meal. 360 tablet 0  . naproxen (NAPROSYN) 500 MG tablet Take 1 tablet (500 mg total) by mouth 2 (two) times daily. 30 tablet 0  . risperiDONE (RISPERDAL) 3 MG tablet Take 0.5 tablets (1.5 mg total) by mouth 2 (two) times daily. 30 tablet 2  . Zinc Oxide 13 % CREA Apply topically to affected area several times per day. 113 g 1   No facility-administered medications prior to visit.     PAST MEDICAL HISTORY: Past Medical History:  Diagnosis Date  . Blind left eye   . Deaf   . Developmental delay   . Diabetes mellitus without complication ([place])   . Mental retardation     PAST SURGICAL HISTORY: Past Surgical History:  Procedure Laterality Date  . None      FAMILY HISTORY: Family History  Problem Relation Age of Onset  . Hypertension Mother   . Arthritis Mother   . Heart disease Mother   .  Diabetes Father   . Hypertension Father     SOCIAL HISTORY: Social History   Socioeconomic History  . Marital status: Single    Spouse name: Not on file  . Number of children: Not on file  . Years of education: Not on file  . Highest education level: Not on file  Occupational History  . Not on file  Social Needs  . Financial resource strain: Not on file  . Food insecurity    Worry: Not on file    Inability: Not on file  . Transportation needs    Medical: Not on file    Non-medical: Not on file  Tobacco Use  . Smoking status: Never Smoker  . Smokeless tobacco: Never Used  Substance and Sexual Activity  . Alcohol use: No  . Drug use:  No  . Sexual activity: Never  Lifestyle  . Physical activity    Days per week: Not on file    Minutes per session: Not on file  . Stress: Not on file  Relationships  . Social Herbalist on phone: Not on file    Gets together: Not on file    Attends religious service: Not on file    Active member of club or organization: Not on file    Attends meetings of clubs or organizations: Not on file    Relationship status: Not on file  . Intimate partner violence    Fear of current or ex partner: Not on file    Emotionally abused: Not on file    Physically abused: Not on file    Forced sexual activity: Not on file  Other Topics Concern  . Not on file  Social History Narrative   ** Merged History Encounter **       Patient lives at home with her mom (Zenind). Education- None Left handed. Caffeine- Some hot tea  Patient Special Coordinator Terrilee Croak - [phone] -cell, [phone] and fax [phone].      PHYSICAL EXAM  Vitals:   10/20/18 0838  BP: (!) 130/95  Pulse: (!) 122  Temp: 99.7 F (37.6 C)  TempSrc: Oral  Weight: 139 lb 12.8 oz (63.4 kg)  Height: _0  (1.499 m)   Body mass index is 28.24 kg/m.  Generalized: patient is restless   Neurological examination  Mentation: Alert.  Does not talk or follow commands. Motor: The motor testing reveals 5 over 5 strength of all 4 extremities. Good symmetric motor tone is noted throughout.  Coordination: Unable to test Gait and station: Gait is normal.    DIAGNOSTIC DATA (LABS, IMAGING, TESTING) - I reviewed patient records, labs, notes, testing and imaging myself where available.  Lab Results  Component Value Date   WBC 8.5 08/16/2017   HGB 14.2 08/16/2017   HCT 42.8 08/16/2017   MCV 92 08/16/2017   PLT 280 08/16/2017      Component Value Date/Time   NA 140 08/16/2017 1641   K 4.3 08/16/2017 1641   CL 103 08/16/2017 1641   CO2 25 08/16/2017 1641   GLUCOSE 136 (H) 08/16/2017 1641    GLUCOSE 180 (H) 01/23/2016 1642   BUN 7 08/16/2017 1641   CREATININE 0.45 (L) 08/16/2017 1641   CREATININE 0.83 01/23/2016 1642   CALCIUM 9.8 08/16/2017 1641   PROT 7.2 08/16/2017 1641   ALBUMIN 4.6 08/16/2017 1641   AST 10 08/16/2017 1641   ALT 15 08/16/2017 1641   ALKPHOS 54 08/16/2017 1641  BILITOT <0.2 08/16/2017 1641   GFRNONAA 132 08/16/2017 1641   GFRNONAA >89 01/23/2016 1642   GFRAA 152 08/16/2017 1641   GFRAA >89 01/23/2016 1642   Lab Results  Component Value Date   CHOL 103 08/16/2017   HDL 48 08/16/2017   LDLCALC 27 08/16/2017   TRIG 142 08/16/2017   CHOLHDL 2.1 08/16/2017   Lab Results  Component Value Date   HGBA1C 7.5 (A) 02/28/2018    Lab Results  Component Value Date   TSH 3.08 01/23/2016      ASSESSMENT AND PLAN 33 y.o. year old female  has a past medical history of Blind left eye, Deaf, Developmental delay, Diabetes mellitus without complication ([place]), and Mental retardation. here with:  1.  Mental retardation 2.  Behavioral issues  The patient will be restarted on Risperdal.  I have sent a prescription to the pharmacy.  I have advised the patient and her mother that if she has any new symptoms or worsening of symptoms she should let us know.  She will follow-up in 6 months or sooner if needed.   Ward Givens, MSN, NP-C 10/20/2018, 8:47 AM [facility] [address] [place], [place] [address] [phone]

## 2018-10-20 NOTE — Progress Notes (Signed)
I have reviewed and agreed above plan. 

## 2018-10-25 MED FILL — risperiDONE 3 MG TABS: 3 | 30 days supply | Qty: 30 | Fill #0

## 2018-10-25 MED FILL — ACCU-CHEK GUIDE TEST STRIP: 30 days supply | Qty: 100 | Fill #6

## 2018-10-25 MED FILL — metFORMIN HCL 500 MG TABS: 500 | 30 days supply | Qty: 120 | Fill #2

## 2018-10-25 MED FILL — ACCU-CHEK FASTCLIX LANCETS: 30 days supply | Qty: 102 | Fill #1

## 2018-10-26 ENCOUNTER — Telehealth: Payer: Self-pay | Admitting: *Deleted

## 2018-10-26 NOTE — Telephone Encounter (Signed)
Submitted PA risperidone on [employer] website. PA approved 10/26/18-10/26/19. PA# [id number] W.

## 2018-10-31 ENCOUNTER — Other Ambulatory Visit: Payer: Self-pay

## 2018-10-31 ENCOUNTER — Ambulatory Visit: Payer: Medicaid Other | Attending: Nurse Practitioner | Admitting: Nurse Practitioner

## 2018-10-31 ENCOUNTER — Encounter: Payer: Self-pay | Admitting: Nurse Practitioner

## 2018-10-31 VITALS — BP 122/56 | Temp 98.9°F | Ht 59.0 in | Wt 137.0 lb

## 2018-10-31 DIAGNOSIS — F79 Unspecified intellectual disabilities: Secondary | ICD-10-CM | POA: Diagnosis not present

## 2018-10-31 DIAGNOSIS — Z79899 Other long term (current) drug therapy: Secondary | ICD-10-CM | POA: Diagnosis not present

## 2018-10-31 DIAGNOSIS — Z3202 Encounter for pregnancy test, result negative: Secondary | ICD-10-CM

## 2018-10-31 DIAGNOSIS — H5462 Unqualified visual loss, left eye, normal vision right eye: Secondary | ICD-10-CM | POA: Diagnosis not present

## 2018-10-31 DIAGNOSIS — E119 Type 2 diabetes mellitus without complications: Secondary | ICD-10-CM | POA: Diagnosis not present

## 2018-10-31 DIAGNOSIS — E11649 Type 2 diabetes mellitus with hypoglycemia without coma: Secondary | ICD-10-CM | POA: Insufficient documentation

## 2018-10-31 DIAGNOSIS — R140 Abdominal distension (gaseous): Secondary | ICD-10-CM | POA: Insufficient documentation

## 2018-10-31 DIAGNOSIS — I471 Supraventricular tachycardia, unspecified: Secondary | ICD-10-CM | POA: Insufficient documentation

## 2018-10-31 DIAGNOSIS — Z7984 Long term (current) use of oral hypoglycemic drugs: Secondary | ICD-10-CM | POA: Insufficient documentation

## 2018-10-31 LAB — POCT GLYCOSYLATED HEMOGLOBIN (HGB A1C): Hemoglobin A1C: 6.2 % — AB (ref 4.0–5.6)

## 2018-10-31 LAB — POCT URINALYSIS DIP (CLINITEK)
Bilirubin, UA: NEGATIVE
Blood, UA: NEGATIVE
Glucose, UA: NEGATIVE mg/dL
Leukocytes, UA: NEGATIVE
Nitrite, UA: NEGATIVE
POC PROTEIN,UA: NEGATIVE
Spec Grav, UA: >=1.03 — AB (ref 1.010–1.025)
Urobilinogen, UA: 0.2 E.U./dL
pH, UA: 5 (ref 5.0–8.0)

## 2018-10-31 LAB — GLUCOSE, POCT (MANUAL RESULT ENTRY): POC Glucose: 142 mg/dl — AB (ref 70–99)

## 2018-10-31 LAB — POCT URINE PREGNANCY: Preg Test, Ur: NEGATIVE

## 2018-10-31 MED ORDER — LISINOPRIL 5 MG PO TABS: 5.0000 mg | ORAL_TABLET | Freq: Every day | ORAL | 3 refills | Status: DC

## 2018-10-31 MED ORDER — ACCU-CHEK GUIDE W/DEVICE KIT: 1.0000 | PACK | Freq: Every day | 0 refills | Status: DC

## 2018-10-31 MED ORDER — METFORMIN HCL 500 MG PO TABS: 1000.0000 mg | ORAL_TABLET | Freq: Two times a day (BID) | ORAL | 0 refills | Status: DC

## 2018-10-31 MED ORDER — ACCU-CHEK GUIDE VI STRP: ORAL_STRIP | 11 refills | Status: DC

## 2018-10-31 MED ORDER — ACCU-CHEK FASTCLIX LANCETS MISC: 1.0000 | Freq: Every day | 11 refills | Status: DC

## 2018-10-31 MED FILL — ACCU-CHEK GUIDE W/DEVICE KI: W/DEVICE | 1 days supply | Qty: 1 | Fill #0

## 2018-10-31 MED FILL — LISINOPRIL 5 MG TABLET: 5 | 90 days supply | Qty: 90 | Fill #0

## 2018-10-31 NOTE — Progress Notes (Signed)
Assessment & Plan:  [name] was seen today for diabetes.  Diagnoses and all orders for this visit:  Type 2 diabetes mellitus without complication, without long-term current use of insulin (HCC) -     Glucose (CBG) -     HgB A1c -     Lipid panel -     Blood Glucose Monitoring Suppl (ACCU-CHEK GUIDE) w/Device KIT; 1 kit by Does not apply route daily. Use to check blood sugar up to 3 times daily. Patient's meter is broken. Needs Replacement. -     Accu-Chek FastClix Lancets MISC; 1 kit by Does not apply route daily. Use to check blood sugar up to 3 times daily. -     lisinopril (ZESTRIL) 5 MG tablet; Take 1 tablet (5 mg total) by mouth daily. -     glucose blood (ACCU-CHEK GUIDE) test strip; Use as instructed to check blood sugar up to 3 times daily. -     metFORMIN (GLUCOPHAGE) 500 MG tablet; Take 2 tablets (1,000 mg total) by mouth 2 (two) times daily with a meal. Continue blood sugar control as discussed in office today, low carbohydrate diet, and regular physical exercise as tolerated, 150 minutes per week (30 min each day, 5 days per week, or 50 min 3 days per week). Keep blood sugar logs with fasting goal of 90-130 mg/dl, post prandial (after you eat) less than 180.  For Hypoglycemia: BS <60 and Hyperglycemia BS >400; contact the clinic ASAP. Annual eye exams and foot exams are recommended.  Abdominal distension -     CMP14+EGFR -     Lipid panel -     CBC -     POCT URINALYSIS DIP (CLINITEK) -     POCT urine pregnancy -     Urine Culture  Supraventricular tachycardia ([place]) RESOLVED   Patient has been counseled on age-appropriate routine health concerns for screening and prevention. These are reviewed and up-to-date. Referrals have been placed accordingly. Immunizations are up-to-date or declined.    Subjective:   Chief Complaint  Patient presents with  . Diabetes    Follow up on diabetes. Pt's mother is concern of patient's belly growth and is not sure what is causing it.    HPI Makayla Roberts 33 y.o. female presents to office today for follow up to DM.   has a past medical history of Blind left eye, Deaf, Developmental delay, Diabetes mellitus without complication ([place]), and Mental retardation.  Her mother is accompanying her today and speaks on her behalf.   DM TYPE 2 Improved. Taking metfomin 1000 mg BID. Mother denies any obvious hypo or hyperglycemic symptoms. Taking low dose ACE.  Eye exam is UTD. Mother states glucometer has been broken so she does not have a way to monitor Makayla Roberts's blood glucose levels.  Lab Results  Component Value Date   HGBA1C 6.2 (A) 10/31/2018   GI CONCERN Mom is concerned that patient's stomach seems to be distended at times. "Sometimes looks like she is pregnant". She is not sexually active and mother states she has constant 24hr supervision. Denies constipation, lactose intolerance and has several loose bowel movements per day.States [name] will sometimes punch her stomach as in pain. She does have regular menstrual cycles. Mother denies any GU symptoms such as increased vaginal discharge, odor, symptoms of dysuria, frequency, urgency. Denies N/V.   Review of Systems  Constitutional: Negative for fever, malaise/fatigue and weight loss.  HENT: Negative for nosebleeds.        Deaf  Eyes:       Blind left eye  Respiratory: Negative.  Negative for cough and shortness of breath.   Cardiovascular: Negative.  Negative for chest pain, palpitations and leg swelling.  Gastrointestinal: Negative.  Negative for abdominal pain, blood in stool, constipation, diarrhea, heartburn, nausea and vomiting.       SEE HPI  Musculoskeletal: Negative.  Negative for myalgias.  Neurological: Negative.  Negative for dizziness, focal weakness, seizures and headaches.  Psychiatric/Behavioral: Negative.  Negative for suicidal ideas.       Developmental delay    Past Medical History:  Diagnosis Date  . Blind left eye   . Deaf   . Developmental delay    . Diabetes mellitus without complication ([place])   . Mental retardation     Past Surgical History:  Procedure Laterality Date  . None      Family History  Problem Relation Age of Onset  . Hypertension Mother   . Arthritis Mother   . Heart disease Mother   . Diabetes Father   . Hypertension Father     Social History Reviewed with no changes to be made today.   Outpatient Medications Prior to Visit  Medication Sig Dispense Refill  . naproxen (NAPROSYN) 500 MG tablet Take 1 tablet (500 mg total) by mouth 2 (two) times daily. 30 tablet 0  . risperiDONE (RISPERDAL) 3 MG tablet Take 0.5 tablets (1.5 mg total) by mouth 2 (two) times daily. 30 tablet 5  . Zinc Oxide 13 % CREA Apply topically to affected area several times per day. 113 g 1  . ACCU-CHEK FASTCLIX LANCETS MISC 1 kit by Does not apply route daily. Use to check blood sugar up to 3 times daily. 102 each 11  . Blood Glucose Monitoring Suppl (ACCU-CHEK GUIDE) w/Device KIT 1 kit by Does not apply route daily. Use to check blood sugar up to 3 times daily. 1 kit 0  . glucose blood (ACCU-CHEK GUIDE) test strip Use as instructed to check blood sugar up to 3 times daily. 100 each 11  . metFORMIN (GLUCOPHAGE) 500 MG tablet Take 2 tablets (1,000 mg total) by mouth 2 (two) times daily with a meal. 360 tablet 0  . lisinopril (PRINIVIL,ZESTRIL) 5 MG tablet Take 1 tablet (5 mg total) by mouth daily. (Patient not taking: Reported on 10/20/2018) 90 tablet 3   No facility-administered medications prior to visit.     Allergies  Allergen Reactions  . Morphine And Related Shortness Of Breath       Objective:    BP (!) 122/56 (BP Location: Left Arm, Patient Position: Sitting, Cuff Size: Normal)   Temp 98.9 F (37.2 C) (Oral)   Ht '4\' 11"'$  (1.499 m)   Wt 137 lb (62.1 kg)   SpO2 98%   BMI 27.67 kg/m  Wt Readings from Last 3 Encounters:  10/31/18 137 lb (62.1 kg)  10/20/18 139 lb 12.8 oz (63.4 kg)  09/20/18 145 lb (65.8 kg)    Physical  Exam Vitals signs and nursing note reviewed.  Constitutional:      Appearance: She is well-developed.  HENT:     Head: Normocephalic and atraumatic.  Neck:     Musculoskeletal: Normal range of motion.  Cardiovascular:     Rate and Rhythm: Normal rate and regular rhythm.     Heart sounds: Normal heart sounds. No murmur. No friction rub. No gallop.   Pulmonary:     Effort: Pulmonary effort is normal. No tachypnea or respiratory distress.  Breath sounds: Normal breath sounds. No decreased breath sounds, wheezing, rhonchi or rales.  Chest:     Chest wall: No tenderness.  Abdominal:     General: Bowel sounds are normal. There is distension.     Palpations: Abdomen is soft. There is no mass.     Tenderness: There is no abdominal tenderness. There is no right CVA tenderness, left CVA tenderness, guarding or rebound.     Hernia: No hernia is present.  Musculoskeletal: Normal range of motion.  Skin:    General: Skin is warm and dry.  Neurological:     Mental Status: She is alert.     Motor: Motor function is intact.  Psychiatric:        Behavior: Behavior is cooperative.          Patient has been counseled extensively about nutrition and exercise as well as the importance of adherence with    medications and regular follow-up. The patient was given clear instructions to go to ER or return to medical center if symptoms don't improve, worsen or new problems develop. The patient verbalized understanding.   Follow-up: Return in about 3 months (around 01/30/2019) for DM.   Gildardo Pounds, FNP-BC [facility] [place], [place]   10/31/2018, 12:22 PM

## 2018-11-01 LAB — CMP14+EGFR
ALT: 15 IU/L (ref 0–32)
AST: 12 IU/L (ref 0–40)
Albumin/Globulin Ratio: 1.9 (ref 1.2–2.2)
Albumin: 4.5 g/dL (ref 3.8–4.8)
Alkaline Phosphatase: 49 IU/L (ref 39–117)
BUN/Creatinine Ratio: 22 (ref 9–23)
BUN: 13 mg/dL (ref 6–20)
Bilirubin Total: 0.3 mg/dL (ref 0.0–1.2)
CO2: 21 mmol/L (ref 20–29)
Calcium: 9.9 mg/dL (ref 8.7–10.2)
Chloride: 105 mmol/L (ref 96–106)
Creatinine, Ser: 0.59 mg/dL (ref 0.57–1.00)
GFR calc Af Amer: 137 mL/min/{1.73_m2} (ref >59)
GFR calc non Af Amer: 119 mL/min/{1.73_m2} (ref >59)
Globulin, Total: 2.4 g/dL (ref 1.5–4.5)
Glucose: 144 mg/dL — ABNORMAL HIGH (ref 65–99)
Potassium: 4.6 mmol/L (ref 3.5–5.2)
Sodium: 141 mmol/L (ref 134–144)
Total Protein: 6.9 g/dL (ref 6.0–8.5)

## 2018-11-01 LAB — LIPID PANEL
Chol/HDL Ratio: 2.6 ratio (ref 0.0–4.4)
Cholesterol, Total: 124 mg/dL (ref 100–199)
HDL: 47 mg/dL (ref >39)
LDL Chol Calc (NIH): 39 mg/dL (ref 0–99)
Triglycerides: 246 mg/dL — ABNORMAL HIGH (ref 0–149)
VLDL Cholesterol Cal: 38 mg/dL (ref 5–40)

## 2018-11-01 LAB — CBC
Hematocrit: 39.8 % (ref 34.0–46.6)
Hemoglobin: 13.8 g/dL (ref 11.1–15.9)
MCH: 31.3 pg (ref 26.6–33.0)
MCHC: 34.7 g/dL (ref 31.5–35.7)
MCV: 90 fL (ref 79–97)
Platelets: 286 10*3/uL (ref 150–450)
RBC: 4.41 x10E6/uL (ref 3.77–5.28)
RDW: 12.6 % (ref 11.7–15.4)
WBC: 8.3 10*3/uL (ref 3.4–10.8)

## 2018-11-02 LAB — URINE CULTURE

## 2018-11-21 MED FILL — ACCU-CHEK GUIDE TEST STRIP: 30 days supply | Qty: 100 | Fill #7

## 2018-11-21 MED FILL — ACCU-CHEK FASTCLIX LANCETS: 30 days supply | Qty: 102 | Fill #2

## 2018-11-21 MED FILL — risperiDONE 3 MG TABS: 3 | 30 days supply | Qty: 30 | Fill #1

## 2018-11-21 MED FILL — metFORMIN HCL 500 MG TABS: 500 | 30 days supply | Qty: 120 | Fill #0

## 2018-12-26 MED FILL — ACCU-CHEK GUIDE TEST STRIP: 30 days supply | Qty: 100 | Fill #8

## 2018-12-26 MED FILL — metFORMIN HCL 500 MG TABS: 500 | 30 days supply | Qty: 120 | Fill #1

## 2018-12-26 MED FILL — risperiDONE 3 MG TABS: 3 | 30 days supply | Qty: 30 | Fill #2

## 2019-01-26 ENCOUNTER — Telehealth: Payer: Self-pay

## 2019-01-26 NOTE — Telephone Encounter (Signed)
With Christa See, LCSW, attempted to contact patient's mother to inquire if she is in need of supportive services for her daughter. With assistance of arabic interpreter # [phone] /[employer], call placed to # [phone] and the voicemail was not set up Call placed to contact noted: Julius Bowels # [phone]  x2 and the phone rang busy.

## 2019-01-27 ENCOUNTER — Telehealth: Payer: Self-pay | Admitting: Nurse Practitioner

## 2019-01-27 NOTE — Telephone Encounter (Signed)
Patient is trying to get a citizenship and needs a evaluation

## 2019-02-06 MED FILL — ACCU-CHEK GUIDE TEST STRIP: 30 days supply | Qty: 100 | Fill #9

## 2019-02-06 MED FILL — risperiDONE 3 MG TABS: 3 | 30 days supply | Qty: 30 | Fill #3

## 2019-02-06 MED FILL — ACCU-CHEK FASTCLIX LANCETS: 30 days supply | Qty: 102 | Fill #3

## 2019-02-06 MED FILL — metFORMIN HCL 500 MG TABS: 500 | 30 days supply | Qty: 120 | Fill #2

## 2019-03-01 MED FILL — OLOPATADINE HCL 0.2% EYE DR: 0.2 | 18 days supply | Qty: 3 | Fill #0

## 2019-03-04 ENCOUNTER — Encounter: Payer: Self-pay | Admitting: Nurse Practitioner

## 2019-03-06 ENCOUNTER — Other Ambulatory Visit: Payer: Self-pay | Admitting: Pharmacist

## 2019-03-06 ENCOUNTER — Other Ambulatory Visit: Payer: Self-pay | Admitting: Nurse Practitioner

## 2019-03-06 DIAGNOSIS — E119 Type 2 diabetes mellitus without complications: Secondary | ICD-10-CM

## 2019-03-06 MED ORDER — LANCETS MISC: 11 refills | Status: DC

## 2019-03-06 MED FILL — ACCU-CHEK GUIDE TEST STRIP: 30 days supply | Qty: 100 | Fill #10

## 2019-03-06 MED FILL — metFORMIN HCL 500 MG TABS: 500 | 30 days supply | Qty: 120 | Fill #0

## 2019-03-06 MED FILL — risperiDONE 3 MG TABS: 3 | 30 days supply | Qty: 30 | Fill #4

## 2019-03-07 MED FILL — ACCU-CHEK FASTCLIX LANCETS: 30 days supply | Qty: 102 | Fill #4

## 2019-03-08 ENCOUNTER — Telehealth: Payer: Self-pay

## 2019-03-08 NOTE — Telephone Encounter (Signed)
CMA had mailed out a letter to the [facility] in [place].

## 2019-03-28 MED FILL — LISINOPRIL 5 MG TABLET: 5 | 90 days supply | Qty: 90 | Fill #1

## 2019-03-28 MED FILL — risperiDONE 3 MG TABS: 3 | 30 days supply | Qty: 30 | Fill #5

## 2019-04-04 MED FILL — ACCU-CHEK GUIDE TEST STRIP: 30 days supply | Qty: 100 | Fill #0

## 2019-04-04 MED FILL — OLOPATADINE HCL 0.2% EYE DR: 0.2 | 18 days supply | Qty: 3 | Fill #0

## 2019-04-04 MED FILL — metFORMIN HCL 500 MG TABS: 500 | 30 days supply | Qty: 120 | Fill #1

## 2019-04-19 ENCOUNTER — Encounter: Payer: Self-pay | Admitting: Adult Health

## 2019-04-19 ENCOUNTER — Telehealth: Payer: Self-pay

## 2019-04-19 ENCOUNTER — Telehealth: Payer: Self-pay | Admitting: Adult Health

## 2019-04-19 ENCOUNTER — Ambulatory Visit: Payer: Medicaid Other | Admitting: Adult Health

## 2019-04-19 ENCOUNTER — Other Ambulatory Visit: Payer: Self-pay

## 2019-04-19 VITALS — BP 134/90 | HR 100 | Temp 97.3°F | Ht 59.0 in | Wt 140.0 lb

## 2019-04-19 DIAGNOSIS — F89 Unspecified disorder of psychological development: Secondary | ICD-10-CM

## 2019-04-19 NOTE — Telephone Encounter (Signed)
Spoke with the patient's daughter in law(Shazia Welton Flakes) to let her know that Aundra Millet would be referring the patient to have genetic testing done for "Angelman syndrome". Patient's daughter in law was agreeable and appreciative for the phone call.

## 2019-04-19 NOTE — Progress Notes (Addendum)
PATIENT: Makayla Roberts DOB: [birth year]  REASON FOR VISIT: follow up HISTORY FROM: patient  HISTORY OF PRESENT ILLNESS: Today 04/19/19:  Makayla Roberts is a 34 year old female with a history of developmental delay with behavioral issues.  She returns today for follow-up.  She is here today with her mother and sister-in-law.  The sister-in-law does not feel that the patient has any behavioral issues.  She also has some concerns that the patient does not have a formal diagnosis.  She states that she has a pediatric nurse friend that she has been discussing the patient with.  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  She is requesting further testing to confirm or deny this diagnosis.  She also feels that the patient could benefit from physical therapy and Occupational Therapy.  They do have a report that there is been no change in her gait or balance since the last visit.  The sister-in-law also feels that the patient is not "deaf."  She would like a referral to audiology to have formal hearing testing.  HISTORY 10/20/18:  Makayla Roberts is a 34 year old female with a history of mental retardation and behavioral issues.  She returns today for follow-up.  She is with her mother today.  We are using the interpreter language line.  The patient ran out of her medication.  Mom reports that she has been having behavioral issues.  States that she screams and jumps around when taken outside the home especially if mom is not with her.  She denies any other symptoms.  She returns today for evaluation.  HISTORY Makayla Roberts is a 34 year old female with a history of mental retardation, impaired speech and behavioral issues. The patient returns today for followup and interpreter is present. She is currentlyoffRisperdalas she ran out and was told by the pharmacy that no refills remained.Since she has been off of the medication she has had more behavior issues hitting the mom jumping around and hitting  herself and these are happening daily. She has been well controlled on Risperdal in the past.Mom also reports that blood sugars range from 150-170 and her appetite is good.She still goes to daycare 5 days a week .She also has seasonal allergies. She has been unable to get a CT of the head in the past due to not being cooperative even with medication. She returns for reevaluation   REVIEW OF SYSTEMS: Out of a complete 14 system review of symptoms, the patient complains only of the following symptoms, and all other reviewed systems are negative.   See HPI  ALLERGIES: Allergies  Allergen Reactions  . Morphine And Related Shortness Of Breath    HOME MEDICATIONS: Outpatient Medications Prior to Visit  Medication Sig Dispense Refill  . Blood Glucose Monitoring Suppl (ACCU-CHEK GUIDE) w/Device KIT 1 kit by Does not apply route daily. Use to check blood sugar up to 3 times daily. Patient's meter is broken. Needs Replacement. 1 kit 0  . glucose blood (ACCU-CHEK GUIDE) test strip Use as instructed to check blood sugar up to 3 times daily. 100 each 11  . Lancets MISC 1 kit by Does not apply route daily. Use to check blood sugar up to 3 times daily. E11.9 100 each 11  . lisinopril (ZESTRIL) 5 MG tablet Take 1 tablet (5 mg total) by mouth daily. 90 tablet 3  . metFORMIN (GLUCOPHAGE) 500 MG tablet TAKE 2 TABLETS (1,000 MG TOTAL) BY MOUTH 2 (TWO) TIMES DAILY WITH A MEAL. 120 tablet 2  .  naproxen (NAPROSYN) 500 MG tablet Take 1 tablet (500 mg total) by mouth 2 (two) times daily. 30 tablet 0  . risperiDONE (RISPERDAL) 3 MG tablet Take 0.5 tablets (1.5 mg total) by mouth 2 (two) times daily. 30 tablet 5  . Zinc Oxide 13 % CREA Apply topically to affected area several times per day. 113 g 1   No facility-administered medications prior to visit.    PAST MEDICAL HISTORY: Past Medical History:  Diagnosis Date  . Blind left eye   . Deaf   . Developmental delay   . Diabetes mellitus without  complication ([place])   . Mental retardation     PAST SURGICAL HISTORY: Past Surgical History:  Procedure Laterality Date  . None      FAMILY HISTORY: Family History  Problem Relation Age of Onset  . Hypertension Mother   . Arthritis Mother   . Heart disease Mother   . Diabetes Father   . Hypertension Father     SOCIAL HISTORY: Social History   Socioeconomic History  . Marital status: Single    Spouse name: Not on file  . Number of children: Not on file  . Years of education: Not on file  . Highest education level: Not on file  Occupational History  . Not on file  Tobacco Use  . Smoking status: Never Smoker  . Smokeless tobacco: Never Used  Substance and Sexual Activity  . Alcohol use: No  . Drug use: No  . Sexual activity: Never  Other Topics Concern  . Not on file  Social History Narrative   ** Merged History Encounter **       Patient lives at home with her mom (Zenind). Education- None Left handed. Caffeine- Some hot tea  Patient Special Coordinator Terrilee Croak - [phone] -cell, [phone] and fax [phone].   Social Determinants of Health   Financial Resource Strain:   . Difficulty of Paying Living Expenses: Not on file  Food Insecurity:   . Worried About Charity fundraiser in the Last Year: Not on file  . Ran Out of Food in the Last Year: Not on file  Transportation Needs:   . Lack of Transportation (Medical): Not on file  . Lack of Transportation (Non-Medical): Not on file  Physical Activity:   . Days of Exercise per Week: Not on file  . Minutes of Exercise per Session: Not on file  Stress:   . Feeling of Stress : Not on file  Social Connections:   . Frequency of Communication with Friends and Family: Not on file  . Frequency of Social Gatherings with Friends and Family: Not on file  . Attends Religious Services: Not on file  . Active Member of Clubs or Organizations: Not on file  . Attends Archivist Meetings: Not on  file  . Marital Status: Not on file  Intimate Partner Violence:   . Fear of Current or Ex-Partner: Not on file  . Emotionally Abused: Not on file  . Physically Abused: Not on file  . Sexually Abused: Not on file      PHYSICAL EXAM  Vitals:   04/19/19 0754  BP: 134/90  Pulse: 100  Temp: (!) 97.3 F (36.3 C)  TempSrc: Oral  Weight: 140 lb (63.5 kg)  Height: '4\' 11"'$  (1.499 m)   Body mass index is 28.28 kg/m.  Generalized: Well developed, in no acute distress   Neurological examination  Mentation: Alert.  Nonverbal in the office  visit today Cranial nerve II-XII: Facial symmetry noted  motor: Good strength in all extremities--patient ambulating around the room sensory: Not tested Coordination: Not tested Gait and station: Patient ambulating around the room. Reflexes: Not tested  DIAGNOSTIC DATA (LABS, IMAGING, TESTING) - I reviewed patient records, labs, notes, testing and imaging myself where available.  Lab Results  Component Value Date   WBC 8.3 10/31/2018   HGB 13.8 10/31/2018   HCT 39.8 10/31/2018   MCV 90 10/31/2018   PLT 286 10/31/2018      Component Value Date/Time   NA 141 10/31/2018 1016   K 4.6 10/31/2018 1016   CL 105 10/31/2018 1016   CO2 21 10/31/2018 1016   GLUCOSE 144 (H) 10/31/2018 1016   GLUCOSE 180 (H) 01/23/2016 1642   BUN 13 10/31/2018 1016   CREATININE 0.59 10/31/2018 1016   CREATININE 0.83 01/23/2016 1642   CALCIUM 9.9 10/31/2018 1016   PROT 6.9 10/31/2018 1016   ALBUMIN 4.5 10/31/2018 1016   AST 12 10/31/2018 1016   ALT 15 10/31/2018 1016   ALKPHOS 49 10/31/2018 1016   BILITOT 0.3 10/31/2018 1016   GFRNONAA 119 10/31/2018 1016   GFRNONAA >89 01/23/2016 1642   GFRAA 137 10/31/2018 1016   GFRAA >89 01/23/2016 1642   Lab Results  Component Value Date   CHOL 124 10/31/2018   HDL 47 10/31/2018   LDLCALC 39 10/31/2018   TRIG 246 (H) 10/31/2018   CHOLHDL 2.6 10/31/2018   Lab Results  Component Value Date   HGBA1C 6.2 (A)  10/31/2018   No results found for: VITAMINB12 Lab Results  Component Value Date   TSH 3.08 01/23/2016      ASSESSMENT AND PLAN 34 y.o. year old female  has a past medical history of Blind left eye, Deaf, Developmental delay, Diabetes mellitus without complication ([place]), and Mental retardation. here with:  1.  Developmental delays 2.  Behavioral disturbance  I had a long discussion with the patient's mother and sister-in-law.  I also discussed with Dr. Krista Blue.  We do not feel that genetic testing would change the course of treatment for this patient.  At this point we are treating symptoms.  She will remain on Risperdal for behavioral disturbance.  The patient's sister-in-law would like genetic testing.  I will refer to geneticist.  The sister-in-law also feels that physical therapy and Occupational Therapy would offer some benefit.  I am not sure that I agree that physical or occupational therapy would be of benefit at this point as there has been no significant change in her gait or ADLs.  However I will order as the family is persistent.  The sister-in-law will also like a referral to audiology.  I am amenable to placing this today.  The sister-in-law was also under the impression that this visit would be to do an evaluation for citizenship.  I advised that this appointment was a follow-up from the last visit.  We were never told about evaluation for citizenship nor do I have any paperwork for this.  This typically can be done by her primary care provider.  Patient will follow up in 6 months or sooner if needed.  I spent 1 hour with the patient and her family discussing genetic testing treatment options and plan of care   Ward Givens, MSN, NP-C 04/19/2019, 7:48 AM [facility] [address] [place], [place] [address] [phone]

## 2019-04-19 NOTE — Telephone Encounter (Signed)
I also advised the daughter-in-law in the office visit that I did not have his paperwork.  This typically can be done by primary care provider.

## 2019-04-19 NOTE — Telephone Encounter (Signed)
Patient's daughter-in-law, Blondell Reveal, accompanied patient today and helped interpret during appointment. She states that patient came today expecting to have evaluation for citizenship, and states that form was faxed to our office a few weeks ago. I am unfamiliar with this process so I asked Stanton Kidney in records. Stanton Kidney did not find any paperwork belonging to patient. I advised patient and family. I advised Blondell Reveal that I would ask NP if this evaluation should be done at PCP or our office, and told her I would call her back to discuss. Please advise.

## 2019-04-19 NOTE — Telephone Encounter (Signed)
Nurses  Handle the Genetic testing . Thanks Annabelle Harman.

## 2019-04-19 NOTE — Patient Instructions (Signed)
Your Plan:  Continue respiradal  PT and OT ordered  Thank you for coming to see [place] at [facility]. I hope we have been able to provide you high quality care today.  You may receive a patient satisfaction survey over the next few weeks. We would appreciate your feedback and comments so that we may continue to improve ourselves and the health of our patients.

## 2019-04-19 NOTE — Progress Notes (Signed)
I have reviewed and agreed above plan. 

## 2019-04-20 NOTE — Telephone Encounter (Signed)
I faxed this paperwork to patient's PCP at [facility], [phone]). Advised daughter-in-law this has been sent over.

## 2019-04-20 NOTE — Telephone Encounter (Signed)
Thanks so much. 

## 2019-04-26 ENCOUNTER — Other Ambulatory Visit: Payer: Self-pay | Admitting: Adult Health

## 2019-04-26 MED FILL — OLOPATADINE HCL 0.2% EYE DR: 0.2 | 18 days supply | Qty: 3 | Fill #1

## 2019-04-26 MED FILL — metFORMIN HCL 500 MG TABS: 500 | 30 days supply | Qty: 120 | Fill #2

## 2019-04-26 MED FILL — risperiDONE 3 MG TABS: 3 | 30 days supply | Qty: 30 | Fill #0

## 2019-05-29 ENCOUNTER — Other Ambulatory Visit: Payer: Self-pay | Admitting: Family Medicine

## 2019-05-29 DIAGNOSIS — E119 Type 2 diabetes mellitus without complications: Secondary | ICD-10-CM

## 2019-05-29 MED FILL — risperiDONE 3 MG TABS: 3 | 30 days supply | Qty: 30 | Fill #1

## 2019-05-29 MED FILL — metFORMIN HCL 500 MG TABS: 500 | 30 days supply | Qty: 120 | Fill #0

## 2019-05-29 MED FILL — OLOPATADINE HCL 0.2% EYE DR: 0.2 | 18 days supply | Qty: 3 | Fill #2

## 2019-08-31 ENCOUNTER — Other Ambulatory Visit: Payer: Self-pay | Admitting: Nurse Practitioner

## 2019-08-31 DIAGNOSIS — E119 Type 2 diabetes mellitus without complications: Secondary | ICD-10-CM

## 2019-08-31 MED ORDER — METFORMIN HCL 500 MG PO TABS: 1000.0000 mg | ORAL_TABLET | Freq: Two times a day (BID) | ORAL | 0 refills | Status: DC

## 2019-08-31 MED FILL — METFORMIN HCL 500 MG TABS: 500 | 30 days supply | Qty: 120 | Fill #0

## 2019-08-31 NOTE — Telephone Encounter (Signed)
Requested medication (s) are due for refill today: yes  Requested medication (s) are on the active medication list: yes   Future visit scheduled: no  Notes to clinic:  medication was prescribed by neurology office Patient also due for follow up   Requested Prescriptions  Pending Prescriptions Disp Refills   risperiDONE (RISPERDAL) 3 MG tablet 30 tablet 5    Sig: Take 0.5 tablets (1.5 mg total) by mouth 2 (two) times daily.      Not Delegated - Psychiatry:  Antipsychotics - Second Generation (Atypical) - risperidone Failed - 08/31/2019  9:29 AM      Failed - This refill cannot be delegated      Failed - Prolactin Level (serum) in normal range and within 180 days    No results found for: PROLACTIN, TOTPROLACTIN, LABPROL        Failed - ALT in normal range and within 180 days    ALT  Date Value Ref Range Status  10/31/2018 15 0 - 32 IU/L Final          Failed - AST in normal range and within 180 days    AST  Date Value Ref Range Status  10/31/2018 12 0 - 40 IU/L Final          Failed - Valid encounter within last 6 months    Recent Outpatient Visits           10 months ago Type 2 diabetes mellitus without complication, without long-term current use of insulin ([place])   [place], [place] W, NP   1 year ago Type 2 diabetes mellitus without complication, without long-term current use of insulin ([place])   [place], Jarome Matin, RPH-CPP   1 year ago Type 2 diabetes mellitus without complication, without long-term current use of insulin ([place])   [place], [place] W, NP   1 year ago Encounter for routine adult physical exam with abnormal findings   [place], [place] W, NP   2 years ago Type 2 diabetes mellitus without complication, without long-term current use of insulin ([place])   [place], [place] W, NP                metFORMIN (GLUCOPHAGE) 500 MG tablet 120 tablet 2    Sig: Take 2 tablets (1,000 mg total) by mouth 2 (two) times daily with a meal.      Endocrinology:  Diabetes - Biguanides Failed - 08/31/2019  9:29 AM      Failed - HBA1C is between 0 and 7.9 and within 180 days    Hemoglobin A1C  Date Value Ref Range Status  10/31/2018 6.2 (A) 4.0 - 5.6 % Final   HbA1c, POC (controlled diabetic range)  Date Value Ref Range Status  02/28/2018 7.5 (A) 0.0 - 7.0 % Final          Failed - Valid encounter within last 6 months    Recent Outpatient Visits           10 months ago Type 2 diabetes mellitus without complication, without long-term current use of insulin [facility])   [place], [place] W, NP   1 year ago Type 2 diabetes mellitus without complication, without long-term current use of insulin [facility])   [place], RPH-CPP  1 year ago Type 2 diabetes mellitus without complication, without long-term current use of insulin ([place])   [place], Vernia Buff, NP   1 year ago Encounter for routine adult physical exam with abnormal findings   [place], [place] W, NP   2 years ago Type 2 diabetes mellitus without complication, without long-term current use of insulin ([place])   [place], [place] W, NP              Passed - Cr in normal range and within 360 days    Creat  Date Value Ref Range Status  01/23/2016 0.83 0.50 - 1.10 mg/dL Final   Creatinine, Ser  Date Value Ref Range Status  10/31/2018 0.59 0.57 - 1.00 mg/dL Final   Creatinine, Urine  Date Value Ref Range Status  01/23/2016 231 20 - 320 mg/dL Final          Passed - eGFR in normal range and within 360 days    GFR, Est African American  Date Value Ref Range Status  01/23/2016 >89 >=60  mL/min Final   GFR calc Af Amer  Date Value Ref Range Status  10/31/2018 137 >59 mL/min/1.73 Final   GFR, Est Non African American  Date Value Ref Range Status  01/23/2016 >89 >=60 mL/min Final   GFR calc non Af Amer  Date Value Ref Range Status  10/31/2018 119 >59 mL/min/1.73 Final

## 2019-08-31 NOTE — Telephone Encounter (Signed)
Copied from CRM [id number]. Topic: Quick Communication - Rx Refill/Question >> Aug 31, 2019  9:25 AM Marylen Ponto wrote: Medication: metFORMIN (GLUCOPHAGE) 500 MG tablet and risperiDONE (RISPERDAL) 3 MG tablet  Has the patient contacted their pharmacy? No  Preferred Pharmacy (with phone number or street name): [facility] [place], [place] - [address] Phone: [phone] Fax: [phone]  Agent: Please be advised that RX refills may take up to 3 business days. We ask that you follow-up with your pharmacy.

## 2019-09-04 MED FILL — risperiDONE 3 MG TABS: 3 | 30 days supply | Qty: 30 | Fill #4

## 2019-09-18 ENCOUNTER — Telehealth: Payer: Self-pay

## 2019-09-18 NOTE — Telephone Encounter (Signed)
Pt sister in law Sophia Welton Flakes brought medical physical form for provider to complete for pt for school. Please fill out form and call Dickie La to come pick it up [phone].  If pt needs appt please let us know.

## 2019-09-19 NOTE — Telephone Encounter (Signed)
Will call when it is completed. Paperwork are 7-10 business day.

## 2019-09-19 NOTE — Telephone Encounter (Signed)
Pt sister in law Dickie La called for an update on the medical form that she brought in. Please contact Dickie La once form is completed and ready for pick up. Cb# [phone]

## 2019-10-02 ENCOUNTER — Other Ambulatory Visit: Payer: Self-pay | Admitting: Family Medicine

## 2019-10-02 ENCOUNTER — Encounter: Payer: Self-pay | Admitting: Nurse Practitioner

## 2019-10-02 DIAGNOSIS — E119 Type 2 diabetes mellitus without complications: Secondary | ICD-10-CM

## 2019-10-02 MED FILL — METFORMIN HCL 500 MG TABS: 500 | 30 days supply | Qty: 120 | Fill #0

## 2019-10-02 MED FILL — risperiDONE 3 MG TABS: 3 | 30 days supply | Qty: 30 | Fill #5

## 2019-10-06 ENCOUNTER — Other Ambulatory Visit: Payer: Self-pay

## 2019-10-06 ENCOUNTER — Encounter: Payer: Self-pay | Admitting: Family

## 2019-10-06 ENCOUNTER — Other Ambulatory Visit: Payer: Self-pay | Admitting: Family

## 2019-10-06 ENCOUNTER — Ambulatory Visit (HOSPITAL_BASED_OUTPATIENT_CLINIC_OR_DEPARTMENT_OTHER): Payer: Medicaid Other | Admitting: Pharmacist

## 2019-10-06 ENCOUNTER — Ambulatory Visit: Payer: Medicaid Other | Attending: Family | Admitting: Family

## 2019-10-06 VITALS — BP 124/84 | HR 120 | Temp 97.3°F | Resp 20 | Ht 58.5 in | Wt 145.0 lb

## 2019-10-06 DIAGNOSIS — E119 Type 2 diabetes mellitus without complications: Secondary | ICD-10-CM

## 2019-10-06 DIAGNOSIS — I10 Essential (primary) hypertension: Secondary | ICD-10-CM

## 2019-10-06 DIAGNOSIS — Z789 Other specified health status: Secondary | ICD-10-CM

## 2019-10-06 DIAGNOSIS — Z23 Encounter for immunization: Secondary | ICD-10-CM

## 2019-10-06 DIAGNOSIS — Z603 Acculturation difficulty: Secondary | ICD-10-CM

## 2019-10-06 LAB — POCT GLYCOSYLATED HEMOGLOBIN (HGB A1C): HbA1c, POC (controlled diabetic range): 7.3 % — AB (ref 0.0–7.0)

## 2019-10-06 LAB — GLUCOSE, POCT (MANUAL RESULT ENTRY): POC Glucose: 131 mg/dl — AB (ref 70–99)

## 2019-10-06 MED ORDER — LISINOPRIL 5 MG PO TABS: 5.0000 mg | ORAL_TABLET | Freq: Every day | ORAL | 0 refills | Status: DC

## 2019-10-06 MED ORDER — METFORMIN HCL 500 MG PO TABS: 1000.0000 mg | ORAL_TABLET | Freq: Two times a day (BID) | ORAL | 2 refills | Status: DC

## 2019-10-06 MED FILL — LISINOPRIL 5 MG TABLET: 5 | 90 days supply | Qty: 90 | Fill #0

## 2019-10-06 NOTE — Patient Instructions (Signed)
Continue Metformin for diabetes. Continue Lisinopril for blood pressure. Lab today. Follow-up with primary provider in 3 months or sooner if needed. Diabetes Basics  Diabetes (diabetes mellitus) is a long-term (chronic) disease. It occurs when the body does not properly use sugar (glucose) that is released from food after you eat. Diabetes may be caused by one or both of these problems:  Your pancreas does not make enough of a hormone called insulin.  Your body does not react in a normal way to insulin that it makes. Insulin lets sugars (glucose) go into cells in your body. This gives you energy. If you have diabetes, sugars cannot get into cells. This causes high blood sugar (hyperglycemia). Follow these instructions at home: How is diabetes treated? You may need to take insulin or other diabetes medicines daily to keep your blood sugar in balance. Take your diabetes medicines every day as told by your doctor. List your diabetes medicines here: Diabetes medicines  Name of medicine: ______________________________ ? Amount (dose): _______________ Time (a.m./p.m.): _______________ Notes: ___________________________________  Name of medicine: ______________________________ ? Amount (dose): _______________ Time (a.m./p.m.): _______________ Notes: ___________________________________  Name of medicine: ______________________________ ? Amount (dose): _______________ Time (a.m./p.m.): _______________ Notes: ___________________________________ If you use insulin, you will learn how to give yourself insulin by injection. You may need to adjust the amount based on the food that you eat. List the types of insulin you use here: Insulin  Insulin type: ______________________________ ? Amount (dose): _______________ Time (a.m./p.m.): _______________ Notes: ___________________________________  Insulin type: ______________________________ ? Amount (dose): _______________ Time (a.m./p.m.): _______________  Notes: ___________________________________  Insulin type: ______________________________ ? Amount (dose): _______________ Time (a.m./p.m.): _______________ Notes: ___________________________________  Insulin type: ______________________________ ? Amount (dose): _______________ Time (a.m./p.m.): _______________ Notes: ___________________________________  Insulin type: ______________________________ ? Amount (dose): _______________ Time (a.m./p.m.): _______________ Notes: ___________________________________ How do I manage my blood sugar?  Check your blood sugar levels using a blood glucose monitor as directed by your doctor. Your doctor will set treatment goals for you. Generally, you should have these blood sugar levels:  Before meals (preprandial): 80-130 mg/dL (5.8-5.2 mmol/L).  After meals (postprandial): below 180 mg/dL (10 mmol/L).  A1c level: less than 7%. Write down the times that you will check your blood sugar levels: Blood sugar checks  Time: _______________ Notes: ___________________________________  Time: _______________ Notes: ___________________________________  Time: _______________ Notes: ___________________________________  Time: _______________ Notes: ___________________________________  Time: _______________ Notes: ___________________________________  Time: _______________ Notes: ___________________________________  What do I need to know about low blood sugar? Low blood sugar is called hypoglycemia. This is when blood sugar is at or below 70 mg/dL (3.9 mmol/L). Symptoms may include:  Feeling: ? Hungry. ? Worried or nervous (anxious). ? Sweaty and clammy. ? Confused. ? Dizzy. ? Sleepy. ? Sick to your stomach (nauseous).  Having: ? A fast heartbeat. ? A headache. ? A change in your vision. ? Tingling or no feeling (numbness) around the mouth, lips, or tongue. ? Jerky movements that you cannot control (seizure).  Having trouble with: ? Moving  (coordination). ? Sleeping. ? Passing out (fainting). ? Getting upset easily (irritability). Treating low blood sugar To treat low blood sugar, eat or drink something sugary right away. If you can think clearly and swallow safely, follow the 15:15 rule:  Take 15 grams of a fast-acting carb (carbohydrate). Talk with your doctor about how much you should take.  Some fast-acting carbs are: ? Sugar tablets (glucose pills). Take 3-4 glucose pills. ? 6-8 pieces of hard candy. ? 4-6 oz (120-150 mL)  of fruit juice. ? 4-6 oz (120-150 mL) of regular (not diet) soda. ? 1 Tbsp (15 mL) honey or sugar.  Check your blood sugar 15 minutes after you take the carb.  If your blood sugar is still at or below 70 mg/dL (3.9 mmol/L), take 15 grams of a carb again.  If your blood sugar does not go above 70 mg/dL (3.9 mmol/L) after 3 tries, get help right away.  After your blood sugar goes back to normal, eat a meal or a snack within 1 hour. Treating very low blood sugar If your blood sugar is at or below 54 mg/dL (3 mmol/L), you have very low blood sugar (severe hypoglycemia). This is an emergency. Do not wait to see if the symptoms will go away. Get medical help right away. Call your local emergency services (911 in the [place] Do not drive yourself to the hospital. Questions to ask your health care provider  Do I need to meet with a diabetes educator?  What equipment will I need to care for myself at home?  What diabetes medicines do I need? When should I take them?  How often do I need to check my blood sugar?  What number can I call if I have questions?  When is my next doctor's visit?  Where can I find a support group for people with diabetes? Where to find more information  American Diabetes Association: www.diabetes.org  American Association of Diabetes Educators: www.diabeteseducator.org/patient-resources Contact a doctor if:  Your blood sugar is at or above 240 mg/dL (13.3 mmol/L) for  2 days in a row.  You have been sick or have had a fever for 2 days or more, and you are not getting better.  You have any of these problems for more than 6 hours: ? You cannot eat or drink. ? You feel sick to your stomach (nauseous). ? You throw up (vomit). ? You have watery poop (diarrhea). Get help right away if:  Your blood sugar is lower than 54 mg/dL (3 mmol/L).  You get confused.  You have trouble: ? Thinking clearly. ? Breathing. Summary  Diabetes (diabetes mellitus) is a long-term (chronic) disease. It occurs when the body does not properly use sugar (glucose) that is released from food after digestion.  Take insulin and diabetes medicines as told.  Check your blood sugar every day, as often as told.  Keep all follow-up visits as told by your doctor. This is important. This information is not intended to replace advice given to you by your health care provider. Make sure you discuss any questions you have with your health care provider. Document Revised: 10/26/2018 Document Reviewed: 05/07/2017 Elsevier Patient Education  [place].

## 2019-10-06 NOTE — Progress Notes (Signed)
Patient ID: Karson Chicas, female    DOB: 1985/08/12  MRN: [id number]  CC: School Physical Examination   Subjective: Brycelyn Gambino is a 34 y.o. female with history of essential hypertension benign, supraventricular tachycardia, type 2 diabetes mellitus without complication without long-term current use of insulin, and unspecified hypothyroidism who presents for school physical examination.   1. DIABETES TYPE 2 FOLLOW-UP: Last A1C: 7.3% on 10/06/2019 Are you fasting today:  [x]  No eggs, cappucino, cookies  Have you taken your anti-diabetic medications today: [x]  Yes []  No  Med Adherence:  [x]  Yes    []  No  Medication side effects:  []  Yes    [x]  No Home Monitoring?  [x]  Yes, sometimes Home glucose results range:130-160 Diet Adherence: []  Yes    [x]  No, but doing smaller portions  Exercise: [x]  Yes, walking  Numbness of the feet? []  Yes    [x]  No Last eye exam: January 2021, Dr. Shanon Rosser   2. HYPERTENSION FOLLOW-UP:  Currently taking: see medication list Med Adherence: [x]  Yes    []  No Medication side effects: []  Yes    [x]  No Adherence with salt restriction: [x]  Yes    []  No Exercise: Yes [x]  No []  Home Monitoring?: [x]  Yes    []  No Monitoring Frequency: []  Yes    [x]  No Home BP results range: []  Yes    [x]  No SOB? []  Yes    [x]  No Chest Pain?: []  Yes    [x]  No Leg swelling?: []  Yes    [x]  No Headaches?: []  Yes    [x]  No Dizziness? []  Yes    [x]  No  Patient Active Problem List   Diagnosis Date Noted  . Supraventricular tachycardia ([place]) 10/31/2018  . Sorethroat 03/03/2017  . Itchy eyes 08/12/2016  . Polyphagia 08/12/2016  . Behavior problem, adult 05/12/2016  . School physical exam 06/20/2015  . Type 2 diabetes mellitus without complication, without long-term current use of insulin ([place]) 06/20/2015  . Intellectual disability 12/27/2014  . Tachycardia with hypertension 12/27/2014  . Right ear impacted cerumen 09/14/2014  . Upper respiratory infection 09/14/2014  . Sinus  tachycardia 09/14/2014  . Type 2 diabetes mellitus without complication ([place]) 02/72/5366  . Redness of eye, left 08/23/2014  . Other specified diabetes mellitus without complications ([place]) 44/04/4740  . Allergic rhinoconjunctivitis of left eye 03/15/2014  . Hypovitaminosis D 03/02/2013  . Unspecified hypothyroidism 03/02/2013  . Essential hypertension, benign 03/02/2013  . Blindness of left eye 01/11/2013  . Impaired speech 01/11/2013  . Developmental disability 01/11/2013     Current Outpatient Medications on File Prior to Visit  Medication Sig Dispense Refill  . Blood Glucose Monitoring Suppl (ACCU-CHEK GUIDE) w/Device KIT 1 kit by Does not apply route daily. Use to check blood sugar up to 3 times daily. Patient's meter is broken. Needs Replacement. 1 kit 0  . glucose blood (ACCU-CHEK GUIDE) test strip Use as instructed to check blood sugar up to 3 times daily. 100 each 11  . Lancets MISC 1 kit by Does not apply route daily. Use to check blood sugar up to 3 times daily. E11.9 100 each 11  . lisinopril (ZESTRIL) 5 MG tablet Take 1 tablet (5 mg total) by mouth daily. 90 tablet 3  . metFORMIN (GLUCOPHAGE) 500 MG tablet TAKE 2 TABLETS (1,000 MG TOTAL) BY MOUTH 2 (TWO) TIMES DAILY WITH A MEAL. 120 tablet 0  . naproxen (NAPROSYN) 500 MG tablet Take 1 tablet (500 mg total) by mouth 2 (two) times  daily. 30 tablet 0  . risperiDONE (RISPERDAL) 3 MG tablet TAKE 0.5 TABLETS (1.5 MG TOTAL) BY MOUTH 2 (TWO) TIMES DAILY. 30 tablet 5  . Zinc Oxide 13 % CREA Apply topically to affected area several times per day. 113 g 1   No current facility-administered medications on file prior to visit.    Allergies  Allergen Reactions  . Morphine And Related Shortness Of Breath    Social History   Socioeconomic History  . Marital status: Single    Spouse name: Not on file  . Number of children: Not on file  . Years of education: Not on file  . Highest education level: Not on file  Occupational History    . Not on file  Tobacco Use  . Smoking status: Never Smoker  . Smokeless tobacco: Never Used  Vaping Use  . Vaping Use: Never used  Substance and Sexual Activity  . Alcohol use: No  . Drug use: No  . Sexual activity: Never  Other Topics Concern  . Not on file  Social History Narrative   ** Merged History Encounter **       Patient lives at home with her mom (Zenind). Education- None Left handed. Caffeine- Some hot tea  Patient Special Coordinator Terrilee Croak - [phone] -cell, [phone] and fax [phone].   Social Determinants of Health   Financial Resource Strain:   . Difficulty of Paying Living Expenses: Not on file  Food Insecurity:   . Worried About Charity fundraiser in the Last Year: Not on file  . Ran Out of Food in the Last Year: Not on file  Transportation Needs:   . Lack of Transportation (Medical): Not on file  . Lack of Transportation (Non-Medical): Not on file  Physical Activity:   . Days of Exercise per Week: Not on file  . Minutes of Exercise per Session: Not on file  Stress:   . Feeling of Stress : Not on file  Social Connections:   . Frequency of Communication with Friends and Family: Not on file  . Frequency of Social Gatherings with Friends and Family: Not on file  . Attends Religious Services: Not on file  . Active Member of Clubs or Organizations: Not on file  . Attends Archivist Meetings: Not on file  . Marital Status: Not on file  Intimate Partner Violence:   . Fear of Current or Ex-Partner: Not on file  . Emotionally Abused: Not on file  . Physically Abused: Not on file  . Sexually Abused: Not on file    Family History  Problem Relation Age of Onset  . Hypertension Mother   . Arthritis Mother   . Heart disease Mother   . Diabetes Father   . Hypertension Father     Past Surgical History:  Procedure Laterality Date  . None      ROS: Review of Systems Negative except as stated above  PHYSICAL  EXAM: Vitals with BMI 10/06/2019 04/19/2019 10/31/2018  Height 4' 10.5" $Remove'4\' 11"'eozxsBD$  $RemoveBe'4\' 11"'bCCxdEcnK$   Weight 145 lbs 140 lbs 137 lbs  BMI 29.79 56.31 49.70  Systolic 263 785 885  Diastolic 84 90 56  Pulse 027 100 -    Wt Readings from Last 3 Encounters:  10/06/19 145 lb (65.8 kg)  04/19/19 140 lb (63.5 kg)  10/31/18 137 lb (62.1 kg)   Physical Exam General appearance - alert, well appearing, and in no distress Mental status - normal mood Neck -  supple, no significant adenopathy Lymphatics - no palpable lymphadenopathy, no hepatosplenomegaly Chest - clear to auscultation, no wheezes, rales or rhonchi, symmetric air entry, no tachypnea, retractions or cyanosis Heart - normal rate, regular rhythm, normal S1, S2, no murmurs, rubs, clicks or gallops Neurological - she is alert Musculoskeletal - normal range of motion  Extremities - peripheral pulses normal, no pedal edema, no clubbing or cyanosis, feet normal, good pulses, normal color, temperature and sensation, no edema, redness or tenderness in the calves or thighs Skin - normal coloration and turgor, no rashes, no suspicious skin lesions noted  Results for orders placed or performed in visit on 10/06/19  Glucose (CBG)  Result Value Ref Range   POC Glucose 131 (A) 70 - 99 mg/dl  HgB A1c  Result Value Ref Range   Hemoglobin A1C     HbA1c POC (<> result, manual entry)     HbA1c, POC (prediabetic range)     HbA1c, POC (controlled diabetic range) 7.3 (A) 0.0 - 7.0 %    ASSESSMENT AND PLAN: 1. Type 2 diabetes mellitus without complication, without long-term current use of insulin ([place]): - CBG elevated during today's visit, non-fasting, and asymptomatic.  - Hemoglobin A1C not at goal today at 7.3%, goal < 7%.  - Patient's family prefers to continue current regimen and increase adherence to diabetic diet and exercise as tolerated.  - Continue Metformin as prescribed.  - Encouraged patient's family to check blood sugars at least twice daily and write  the results down to bring to next appointment. - To achieve an A1C goal of less than or equal to 7.0 percent, a fasting blood sugar of 80 to 130 mg/dL and a postprandial glucose (90 to 120 minutes after a meal) less than 180 mg/dL. In the event of sugars less than 60 mg/dl or greater than 400 mg/dl please notify the clinic ASAP. It is recommended that you undergo annual eye exams and annual foot exams. - Discussed the importance of healthy eating habits, low-carbohydrate diet, low-sugar diet, regular aerobic exercise (at least 150 minutes a week as tolerated) and medication compliance to achieve or maintain control of diabetes. - Patient up-to-date on diabetic eye examination.  - CMP to check kidney function, liver function, and electrolyte balance.  - CBC to check anemia.  - Lipid panel to screen cholesterol level. Return within 1 week fasting, meaning no food or drink at least 8 to 12 hours prior to appointment, for lipid panel. - Follow-up with primary provider in 3 months or sooner if needed. - Glucose (CBG) - HgB A1c - CMP14+EGFR - CBC - Lipid Panel; Future - metFORMIN (GLUCOPHAGE) 500 MG tablet; Take 2 tablets (1,000 mg total) by mouth 2 (two) times daily with a meal.  Dispense: 120 tablet; Refill: 2  2. Essential hypertension: - Blood pressure at goal during today's visit.  - Level of control unknown as blood pressure is not monitored at home.  - Continue Lisinopril as prescribed.  - CMP to check kidney function, liver function, and electrolyte balance.  - CBC to check anemia.  - Follow-up with primary provider in 3 months or sooner if needed. - lisinopril (ZESTRIL) 5 MG tablet; Take 1 tablet (5 mg total) by mouth daily.  Dispense: 90 tablet; Refill: 0 - CMP14+EGFR - CBC  3. Language barrier: - Patient accompanied by her mother and sister-in-law Erasmo Score who are both serving as today's historians.   Patient was given the opportunity to ask questions.  Patient verbalized  understanding  of the plan and was able to repeat key elements of the plan. Patient was given clear instructions to go to Emergency Department or return to medical center if symptoms don't improve, worsen, or new problems develop.The patient verbalized understanding.  Camillia Herter, NP

## 2019-10-06 NOTE — Progress Notes (Signed)
CBG and hemoglobin A1C discussed in clinic.

## 2019-10-06 NOTE — Progress Notes (Signed)
Patient presents for vaccination against influenza per orders of Amy Zonia Kief. Consent given. Counseling provided. No contraindications exists. Vaccine administered without incident.

## 2019-10-06 NOTE — Progress Notes (Signed)
Follow up DM

## 2019-10-07 LAB — CMP14+EGFR
ALT: 21 IU/L (ref 0–32)
AST: 19 IU/L (ref 0–40)
Albumin/Globulin Ratio: 1.7 (ref 1.2–2.2)
Albumin: 4.5 g/dL (ref 3.8–4.8)
Alkaline Phosphatase: 60 IU/L (ref 48–121)
BUN/Creatinine Ratio: 13 (ref 9–23)
BUN: 9 mg/dL (ref 6–20)
Bilirubin Total: 0.2 mg/dL (ref 0.0–1.2)
CO2: 23 mmol/L (ref 20–29)
Calcium: 9.6 mg/dL (ref 8.7–10.2)
Chloride: 102 mmol/L (ref 96–106)
Creatinine, Ser: 0.72 mg/dL (ref 0.57–1.00)
GFR calc Af Amer: 125 mL/min/{1.73_m2} (ref >59)
GFR calc non Af Amer: 108 mL/min/{1.73_m2} (ref >59)
Globulin, Total: 2.6 g/dL (ref 1.5–4.5)
Glucose: 128 mg/dL — ABNORMAL HIGH (ref 65–99)
Potassium: 4.4 mmol/L (ref 3.5–5.2)
Sodium: 141 mmol/L (ref 134–144)
Total Protein: 7.1 g/dL (ref 6.0–8.5)

## 2019-10-07 LAB — CBC
Hematocrit: 38 % (ref 34.0–46.6)
Hemoglobin: 13 g/dL (ref 11.1–15.9)
MCH: 31.1 pg (ref 26.6–33.0)
MCHC: 34.2 g/dL (ref 31.5–35.7)
MCV: 91 fL (ref 79–97)
Platelets: 266 10*3/uL (ref 150–450)
RBC: 4.18 x10E6/uL (ref 3.77–5.28)
RDW: 12.8 % (ref 11.7–15.4)
WBC: 7.5 10*3/uL (ref 3.4–10.8)

## 2019-10-07 LAB — LIPID PANEL
Chol/HDL Ratio: 2.2 ratio (ref 0.0–4.4)
Cholesterol, Total: 106 mg/dL (ref 100–199)
HDL: 49 mg/dL (ref >39)
LDL Chol Calc (NIH): 34 mg/dL (ref 0–99)
Triglycerides: 136 mg/dL (ref 0–149)
VLDL Cholesterol Cal: 23 mg/dL (ref 5–40)

## 2019-10-09 ENCOUNTER — Other Ambulatory Visit: Payer: Self-pay

## 2019-10-09 ENCOUNTER — Other Ambulatory Visit: Payer: Self-pay | Admitting: Nurse Practitioner

## 2019-10-09 ENCOUNTER — Ambulatory Visit: Payer: Medicaid Other

## 2019-10-09 MED FILL — ACCU-CHEK GUIDE TEST STRIP: 30 days supply | Qty: 100 | Fill #2

## 2019-10-09 NOTE — Progress Notes (Signed)
Please call patient with update.   Kidney function normal.   Liver function normal.  No anemia.   Cholesterol normal.

## 2019-10-14 ENCOUNTER — Telehealth: Payer: Self-pay

## 2019-10-14 NOTE — Telephone Encounter (Signed)
Contacted pt mother/sister to go over lab results they didn't answer lvm asking them to give a call back on Monday

## 2019-10-25 ENCOUNTER — Ambulatory Visit: Payer: Medicaid Other | Admitting: Nurse Practitioner

## 2019-10-26 ENCOUNTER — Ambulatory Visit: Payer: Medicaid Other | Admitting: Adult Health

## 2019-10-26 ENCOUNTER — Encounter: Payer: Self-pay | Admitting: Adult Health

## 2019-10-26 ENCOUNTER — Other Ambulatory Visit: Payer: Self-pay

## 2019-10-26 VITALS — BP 135/98 | HR 126 | Ht <= 58 in | Wt 144.0 lb

## 2019-10-26 DIAGNOSIS — F69 Unspecified disorder of adult personality and behavior: Secondary | ICD-10-CM

## 2019-10-26 DIAGNOSIS — F89 Unspecified disorder of psychological development: Secondary | ICD-10-CM

## 2019-10-26 NOTE — Patient Instructions (Signed)
Your Plan:  Continue Risperdal  Will check on referral      Thank you for coming to see [place] at [facility]. I hope we have been able to provide you high quality care today.  You may receive a patient satisfaction survey over the next few weeks. We would appreciate your feedback and comments so that we may continue to improve ourselves and the health of our patients.

## 2019-10-26 NOTE — Progress Notes (Signed)
PATIENT: Makayla Roberts DOB: 07/18/1985  REASON FOR VISIT: follow up HISTORY FROM: patient  HISTORY OF PRESENT ILLNESS: Today 10/26/19:  Makayla Roberts is a 34 year old female with a history of developmental delay with behavioral issues.  She returns today for follow-up.  She is here today with her sister-in-law.  At the last visit the sister-in-law requested referral to physical and Occupational Therapy as well as genetic testing.  Physical and Occupational Therapy did not feel that they had anything in addition to offer after reading the notes.  I will have the referral coordinator follow-up on genetic testing as this has not been done and they are still interested.  Overall she feels that the patient is doing well.  She is going to an adult day center now.  Denies any significant behavior issues.  Remains on Risperdal  HISTORY 04/19/19:  Makayla Roberts is a 34 year old female with a history of developmental delay with behavioral issues.  She returns today for follow-up.  She is here today with her mother and sister-in-law.  The sister-in-law does not feel that the patient has any behavioral issues.  She also has some concerns that the patient does not have a formal diagnosis.  She states that she has a pediatric nurse friend that she has been discussing the patient with.  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  She is requesting further testing to confirm or deny this diagnosis.  She also feels that the patient could benefit from physical therapy and Occupational Therapy.  They do have a report that there is been no change in her gait or balance since the last visit.  The sister-in-law also feels that the patient is not "deaf."  She would like a referral to audiology to have formal hearing testing.  REVIEW OF SYSTEMS: Out of a complete 14 system review of symptoms, the patient complains only of the following symptoms, and all other reviewed systems are negative.  See  HPI  ALLERGIES: Allergies  Allergen Reactions  . Morphine And Related Shortness Of Breath    HOME MEDICATIONS: Outpatient Medications Prior to Visit  Medication Sig Dispense Refill  . Blood Glucose Monitoring Suppl (ACCU-CHEK GUIDE) w/Device KIT 1 kit by Does not apply route daily. Use to check blood sugar up to 3 times daily. Patient's meter is broken. Needs Replacement. 1 kit 0  . glucose blood (ACCU-CHEK GUIDE) test strip Use as instructed to check blood sugar up to 3 times daily. 100 each 11  . Lancets MISC 1 kit by Does not apply route daily. Use to check blood sugar up to 3 times daily. E11.9 100 each 11  . metFORMIN (GLUCOPHAGE) 500 MG tablet Take 2 tablets (1,000 mg total) by mouth 2 (two) times daily with a meal. 120 tablet 2  . risperiDONE (RISPERDAL) 3 MG tablet TAKE 0.5 TABLETS (1.5 MG TOTAL) BY MOUTH 2 (TWO) TIMES DAILY. 30 tablet 5  . lisinopril (ZESTRIL) 5 MG tablet Take 1 tablet (5 mg total) by mouth daily. (Patient not taking: Reported on 10/26/2019) 90 tablet 0  . naproxen (NAPROSYN) 500 MG tablet Take 1 tablet (500 mg total) by mouth 2 (two) times daily. 30 tablet 0  . Zinc Oxide 13 % CREA Apply topically to affected area several times per day. 113 g 1   No facility-administered medications prior to visit.    PAST MEDICAL HISTORY: Past Medical History:  Diagnosis Date  . Blind left eye   . Deaf   . Developmental delay   .  Diabetes mellitus without complication (HCC)   . Mental retardation     PAST SURGICAL HISTORY: Past Surgical History:  Procedure Laterality Date  . None      FAMILY HISTORY: Family History  Problem Relation Age of Onset  . Hypertension Mother   . Arthritis Mother   . Heart disease Mother   . Diabetes Father   . Hypertension Father     SOCIAL HISTORY: Social History   Socioeconomic History  . Marital status: Single    Spouse name: Not on file  . Number of children: Not on file  . Years of education: Not on file  . Highest  education level: Not on file  Occupational History  . Not on file  Tobacco Use  . Smoking status: Never Smoker  . Smokeless tobacco: Never Used  Vaping Use  . Vaping Use: Never used  Substance and Sexual Activity  . Alcohol use: No  . Drug use: No  . Sexual activity: Never  Other Topics Concern  . Not on file  Social History Narrative   ** Merged History Encounter **       Patient lives at home with her mom (Zenind). Education- None Left handed. Caffeine- Some hot tea  Patient Special Coordinator Evon Slack - [phone] -cell, [phone] and fax [phone].   Social Determinants of Health   Financial Resource Strain:   . Difficulty of Paying Living Expenses: Not on file  Food Insecurity:   . Worried About Programme researcher, broadcasting/film/video in the Last Year: Not on file  . Ran Out of Food in the Last Year: Not on file  Transportation Needs:   . Lack of Transportation (Medical): Not on file  . Lack of Transportation (Non-Medical): Not on file  Physical Activity:   . Days of Exercise per Week: Not on file  . Minutes of Exercise per Session: Not on file  Stress:   . Feeling of Stress : Not on file  Social Connections:   . Frequency of Communication with Friends and Family: Not on file  . Frequency of Social Gatherings with Friends and Family: Not on file  . Attends Religious Services: Not on file  . Active Member of Clubs or Organizations: Not on file  . Attends Banker Meetings: Not on file  . Marital Status: Not on file  Intimate Partner Violence:   . Fear of Current or Ex-Partner: Not on file  . Emotionally Abused: Not on file  . Physically Abused: Not on file  . Sexually Abused: Not on file      PHYSICAL EXAM  Vitals:   10/26/19 1341  BP: (!) 135/98  Pulse: (!) 126  Weight: 144 lb (65.3 kg)  Height: 4\' 10"  (1.473 m)   Body mass index is 30.1 kg/m.  Generalized: Well developed, in no acute distress   Neurological examination  Mentation:  Alert.  Nonverbal Cranial nerve II-XII: Facial symmetry noted Motor: Good strength throughout Sensory: UTA Coordination: UTA Gait and station: Gait is normal.  Reflexes: UTA   DIAGNOSTIC DATA (LABS, IMAGING, TESTING) - I reviewed patient records, labs, notes, testing and imaging myself where available.  Lab Results  Component Value Date   WBC 7.5 10/06/2019   HGB 13.0 10/06/2019   HCT 38.0 10/06/2019   MCV 91 10/06/2019   PLT 266 10/06/2019      Component Value Date/Time   NA 141 10/06/2019 1234   K 4.4 10/06/2019 1234   CL 102 10/06/2019  1234   CO2 23 10/06/2019 1234   GLUCOSE 128 (H) 10/06/2019 1234   GLUCOSE 180 (H) 01/23/2016 1642   BUN 9 10/06/2019 1234   CREATININE 0.72 10/06/2019 1234   CREATININE 0.83 01/23/2016 1642   CALCIUM 9.6 10/06/2019 1234   PROT 7.1 10/06/2019 1234   ALBUMIN 4.5 10/06/2019 1234   AST 19 10/06/2019 1234   ALT 21 10/06/2019 1234   ALKPHOS 60 10/06/2019 1234   BILITOT 0.2 10/06/2019 1234   GFRNONAA 108 10/06/2019 1234   GFRNONAA >89 01/23/2016 1642   GFRAA 125 10/06/2019 1234   GFRAA >89 01/23/2016 1642   Lab Results  Component Value Date   CHOL 106 10/06/2019   HDL 49 10/06/2019   LDLCALC 34 10/06/2019   TRIG 136 10/06/2019   CHOLHDL 2.2 10/06/2019   Lab Results  Component Value Date   HGBA1C 7.3 (A) 10/06/2019   No results found for: VITAMINB12 Lab Results  Component Value Date   TSH 3.08 01/23/2016      ASSESSMENT AND PLAN 34 y.o. year old female  has a past medical history of Blind left eye, Deaf, Developmental delay, Diabetes mellitus without complication ([place]), and Mental retardation. here with:  Developmental delay with behavioral issues   Continue Risperdal  Will check on referral to geneticist-sister-in-law would still like an evaluation for possible Angelman syndrome  Follow-up in 6 months or sooner if needed   I spent 25 minutes of face-to-face and non-face-to-face time with patient.  This included  previsit chart review, lab review, study review, order entry, electronic health record documentation, patient education.  Ward Givens, MSN, NP-C 10/26/2019, 2:29 PM [facility] [address] [place], [place] [address] [phone]

## 2019-11-08 ENCOUNTER — Other Ambulatory Visit: Payer: Self-pay | Admitting: Adult Health

## 2019-11-08 ENCOUNTER — Other Ambulatory Visit: Payer: Self-pay | Admitting: Nurse Practitioner

## 2019-11-08 ENCOUNTER — Other Ambulatory Visit: Payer: Self-pay | Admitting: Family

## 2019-11-08 ENCOUNTER — Other Ambulatory Visit: Payer: Self-pay | Admitting: Family Medicine

## 2019-11-08 DIAGNOSIS — I10 Essential (primary) hypertension: Secondary | ICD-10-CM

## 2019-11-08 DIAGNOSIS — E119 Type 2 diabetes mellitus without complications: Secondary | ICD-10-CM

## 2019-11-08 MED FILL — METFORMIN HCL 500 MG TABS: 500 | 30 days supply | Qty: 120 | Fill #0

## 2019-11-08 MED FILL — risperiDONE 3 MG TABS: 3 | 30 days supply | Qty: 30 | Fill #0

## 2019-11-08 NOTE — Telephone Encounter (Signed)
Requested medication (s) are due for refill today: Yes  Requested medication (s) are on the active medication list: Yes  Last refill:  10/31/18  Future visit scheduled: Yes  Notes to clinic:  Unable to refill per protocol, expired Rx     Requested Prescriptions  Pending Prescriptions Disp Refills   ACCU-CHEK GUIDE test strip [Pharmacy Med Name: ACCU-CHEK GUIDE TEST STRIP Strip] 100 strip 11    Sig: Use as instructed to check blood sugar up to 3 times daily.      Endocrinology: Diabetes - Testing Supplies Passed - 11/08/2019 11:22 AM      Passed - Valid encounter within last 12 months    Recent Outpatient Visits           1 month ago Need for influenza vaccination   [facility] Lois Huxley, Cornelius Moras, RPH-CPP   1 month ago Type 2 diabetes mellitus without complication, without long-term current use of insulin [facility])   [place] [facility] [place], [place] J, NP   1 year ago Type 2 diabetes mellitus without complication, without long-term current use of insulin [facility])   [place] [facility] [place], [place] W, NP   1 year ago Type 2 diabetes mellitus without complication, without long-term current use of insulin [facility])   [place] [facility] [place], Cornelius Moras, RPH-CPP   1 year ago Type 2 diabetes mellitus without complication, without long-term current use of insulin [facility])   [place] [facility] Claiborne Rigg, NP       Future Appointments             In 2 months Claiborne Rigg, NP [employer] [facility]

## 2019-11-09 MED FILL — ACCU-CHEK GUIDE TEST STRIP: 30 days supply | Qty: 100 | Fill #0

## 2019-11-11 ENCOUNTER — Other Ambulatory Visit: Payer: Self-pay | Admitting: Nurse Practitioner

## 2019-12-05 MED FILL — METFORMIN HCL 500 MG TABS: 500 | 30 days supply | Qty: 120 | Fill #1

## 2019-12-05 MED FILL — risperiDONE 3 MG TABS: 3 | 30 days supply | Qty: 30 | Fill #1

## 2019-12-05 MED FILL — ACCU-CHEK GUIDE TEST STRIP: 30 days supply | Qty: 100 | Fill #1

## 2019-12-06 ENCOUNTER — Telehealth: Payer: Self-pay | Admitting: Nurse Practitioner

## 2019-12-06 NOTE — Telephone Encounter (Signed)
Patient relative dropped off  [place] paperwork to be filled out. Paperwork will be put in PCP box and patient relative was informed that once paperwork is complete we would call her.

## 2019-12-07 NOTE — Telephone Encounter (Signed)
Received. Will give it to [employer], Social Work to fill it out.  Will fax and call patient once completed.

## 2019-12-08 ENCOUNTER — Telehealth: Payer: Self-pay | Admitting: Licensed Clinical Social Worker

## 2019-12-08 NOTE — Telephone Encounter (Signed)
LCSW completed [employer] [place] application and submitted it via e-mail.

## 2020-01-02 MED FILL — LISINOPRIL 5 MG TABLET: 5 | 90 days supply | Qty: 90 | Fill #0

## 2020-01-02 MED FILL — METFORMIN HCL 500 MG TABS: 500 | 30 days supply | Qty: 120 | Fill #2

## 2020-01-02 MED FILL — risperiDONE 3 MG TABS: 3 | 30 days supply | Qty: 30 | Fill #2

## 2020-01-08 ENCOUNTER — Ambulatory Visit: Payer: Medicaid Other | Attending: Nurse Practitioner | Admitting: Nurse Practitioner

## 2020-01-08 ENCOUNTER — Encounter: Payer: Self-pay | Admitting: Nurse Practitioner

## 2020-01-08 ENCOUNTER — Other Ambulatory Visit: Payer: Self-pay | Admitting: Nurse Practitioner

## 2020-01-08 ENCOUNTER — Other Ambulatory Visit: Payer: Self-pay

## 2020-01-08 VITALS — BP 122/82 | HR 113 | Temp 96.5°F | Ht <= 58 in | Wt 139.2 lb

## 2020-01-08 DIAGNOSIS — H5462 Unqualified visual loss, left eye, normal vision right eye: Secondary | ICD-10-CM | POA: Insufficient documentation

## 2020-01-08 DIAGNOSIS — H9190 Unspecified hearing loss, unspecified ear: Secondary | ICD-10-CM | POA: Insufficient documentation

## 2020-01-08 DIAGNOSIS — F79 Unspecified intellectual disabilities: Secondary | ICD-10-CM | POA: Insufficient documentation

## 2020-01-08 DIAGNOSIS — Z79899 Other long term (current) drug therapy: Secondary | ICD-10-CM | POA: Diagnosis not present

## 2020-01-08 DIAGNOSIS — R1312 Dysphagia, oropharyngeal phase: Secondary | ICD-10-CM

## 2020-01-08 DIAGNOSIS — Z885 Allergy status to narcotic agent status: Secondary | ICD-10-CM | POA: Insufficient documentation

## 2020-01-08 DIAGNOSIS — R000 Tachycardia, unspecified: Secondary | ICD-10-CM

## 2020-01-08 DIAGNOSIS — Z7984 Long term (current) use of oral hypoglycemic drugs: Secondary | ICD-10-CM | POA: Diagnosis not present

## 2020-01-08 DIAGNOSIS — R451 Restlessness and agitation: Secondary | ICD-10-CM | POA: Insufficient documentation

## 2020-01-08 DIAGNOSIS — E119 Type 2 diabetes mellitus without complications: Secondary | ICD-10-CM

## 2020-01-08 LAB — POCT GLYCOSYLATED HEMOGLOBIN (HGB A1C): Hemoglobin A1C: 6.2 % — AB (ref 4.0–5.6)

## 2020-01-08 LAB — GLUCOSE, POCT (MANUAL RESULT ENTRY): POC Glucose: 99 mg/dl (ref 70–99)

## 2020-01-08 MED ORDER — LANCETS MISC: 11 refills | Status: DC

## 2020-01-08 MED ORDER — METFORMIN HCL 500 MG PO TABS: 1000.0000 mg | ORAL_TABLET | Freq: Two times a day (BID) | ORAL | 2 refills | Status: DC

## 2020-01-08 MED ORDER — LISINOPRIL 5 MG PO TABS: 5.0000 mg | ORAL_TABLET | Freq: Every day | ORAL | 1 refills | Status: DC

## 2020-01-08 MED ORDER — ACCU-CHEK GUIDE VI STRP: ORAL_STRIP | 11 refills | Status: DC

## 2020-01-08 MED FILL — ACCU-CHEK SOFTCLIX LANCETS: 30 days supply | Qty: 100 | Fill #0

## 2020-01-08 MED FILL — ACCU-CHEK GUIDE TEST STRIP: 30 days supply | Qty: 100 | Fill #0

## 2020-01-08 NOTE — Progress Notes (Signed)
Assessment & Plan:  Makayla Roberts was seen today for follow-up.  Diagnoses and all orders for this visit:  Type 2 diabetes mellitus without complication, without long-term current use of insulin (HCC) -     Glucose (CBG) -     HgB A1c -     Microalbumin/Creatinine Ratio, Urine -     lisinopril (ZESTRIL) 5 MG tablet; Take 1 tablet (5 mg total) by mouth daily. -     metFORMIN (GLUCOPHAGE) 500 MG tablet; Take 2 tablets (1,000 mg total) by mouth 2 (two) times daily with a meal. -     glucose blood (ACCU-CHEK GUIDE) test strip; Use to check blood sugar up to 3 times daily. E11.9 -     Lancets MISC; 1 kit by Does not apply route daily. Use to check blood sugar up to 3 times daily. E11.9  Oropharyngeal dysphagia -     Ambulatory referral to Gastroenterology  Sinus tachycardia -     Ambulatory referral to Cardiology    Patient has been counseled on age-appropriate routine health concerns for screening and prevention. These are reviewed and up-to-date. Referrals have been placed accordingly. Immunizations are up-to-date or declined.    Subjective:   Chief Complaint  Patient presents with  . Follow-up    Pt. is here for a follow up on diabetes.    HPI Makayla Roberts 34 y.o. female presents to office today for follow up. VRI was used to communicate directly with patient for the entire encounter including providing detailed patient instructions.  She is accompanied by her caregiver/mother today. She has a history of developmental delay, mental retardation, DM2, agitation, deafness, Blind in left eye  DM TYPE 2 Well controlled.  Taking metformin 1000 mg BID. LDL at goal. Denies symptoms of hypo or hyperglycemia.  Lab Results  Component Value Date   HGBA1C 6.2 (A) 01/08/2020   Lab Results  Component Value Date   HGBA1C 7.3 (A) 10/06/2019   Lab Results  Component Value Date   LDLCALC 34 10/06/2019    Dysphagia Mother states Makayla Roberts has been experiencing symptoms of dysphagia for 2 months  now.  Mother describes multiple episode(s) weekly of Makayla Roberts regurgitating food. However she does not have difficulty swallowing her pills.  Mom denies cough. Mother states when [place] refuses to eat and feels warm after she regurgitates.    Review of Systems  Constitutional: Negative for fever, malaise/fatigue and weight loss.  HENT: Positive for hearing loss.   Eyes: Negative for photophobia and redness.  Respiratory: Negative.  Negative for cough and shortness of breath.   Cardiovascular: Negative.  Negative for chest pain and leg swelling.  Gastrointestinal: Positive for vomiting. Negative for abdominal pain, blood in stool, constipation, diarrhea, heartburn, melena and nausea.  Musculoskeletal: Negative.  Negative for myalgias.  Neurological: Negative for dizziness, focal weakness, seizures and headaches.  Psychiatric/Behavioral:       Agitation controlled with risperdal    Past Medical History:  Diagnosis Date  . Blind left eye   . Deaf   . Developmental delay   . Diabetes mellitus without complication ([place])   . Mental retardation     Past Surgical History:  Procedure Laterality Date  . None      Family History  Problem Relation Age of Onset  . Hypertension Mother   . Arthritis Mother   . Heart disease Mother   . Diabetes Father   . Hypertension Father     Social History Reviewed with no changes to be  made today.   Outpatient Medications Prior to Visit  Medication Sig Dispense Refill  . Blood Glucose Monitoring Suppl (ACCU-CHEK GUIDE) w/Device KIT 1 kit by Does not apply route daily. Use to check blood sugar up to 3 times daily. Patient's meter is broken. Needs Replacement. 1 kit 0  . risperiDONE (RISPERDAL) 3 MG tablet TAKE 1/2 TABLET BY MOUTH 2 (TWO) TIMES DAILY. 30 tablet 5  . ACCU-CHEK GUIDE test strip USE AS INSTRUCTED TO CHECK BLOOD SUGAR UP TO 3 TIMES DAILY. 100 strip 11  . Lancets MISC 1 kit by Does not apply route daily. Use to check blood sugar up to 3 times  daily. E11.9 100 each 11  . lisinopril (ZESTRIL) 5 MG tablet TAKE 1 TABLET (5 MG TOTAL) BY MOUTH DAILY. 90 tablet 0  . metFORMIN (GLUCOPHAGE) 500 MG tablet Take 2 tablets (1,000 mg total) by mouth 2 (two) times daily with a meal. 120 tablet 2   No facility-administered medications prior to visit.    Allergies  Allergen Reactions  . Morphine And Related Shortness Of Breath       Objective:    BP 122/82 (BP Location: Left Arm, Patient Position: Sitting, Cuff Size: Normal)   Pulse (!) 113   Temp (!) 96.5 F (35.8 C) (Temporal)   Ht $R'4\' 10"'px$  (1.473 m)   Wt 139 lb 3.2 oz (63.1 kg)   SpO2 97%   BMI 29.09 kg/m  Wt Readings from Last 3 Encounters:  01/08/20 139 lb 3.2 oz (63.1 kg)  10/26/19 144 lb (65.3 kg)  10/06/19 145 lb (65.8 kg)    Physical Exam Vitals and nursing note reviewed.  Constitutional:      Appearance: She is well-developed.  HENT:     Head: Normocephalic and atraumatic.  Cardiovascular:     Rate and Rhythm: Regular rhythm. Tachycardia present.     Heart sounds: Normal heart sounds. No murmur heard.  No friction rub. No gallop.   Pulmonary:     Effort: Pulmonary effort is normal. No tachypnea or respiratory distress.     Breath sounds: Normal breath sounds. No decreased breath sounds, wheezing, rhonchi or rales.  Chest:     Chest wall: No tenderness.  Abdominal:     General: Bowel sounds are normal.     Palpations: Abdomen is soft.  Musculoskeletal:        General: Normal range of motion.     Cervical back: Normal range of motion.  Skin:    General: Skin is warm and dry.  Neurological:     Mental Status: She is alert and oriented to person, place, and time.     Coordination: Coordination normal.  Psychiatric:        Speech: She is noncommunicative.        Cognition and Memory: Cognition is impaired.          Patient has been counseled extensively about nutrition and exercise as well as the importance of adherence with medications and regular  follow-up. The patient was given clear instructions to go to ER or return to medical center if symptoms don't improve, worsen or new problems develop. The patient verbalized understanding.   Follow-up: Return in about 6 months (around 07/07/2020).   Gildardo Pounds, FNP-BC [facility] [place], [place]   01/08/2020, 2:27 PM

## 2020-01-09 LAB — MICROALBUMIN / CREATININE URINE RATIO
Creatinine, Urine: 25.1 mg/dL
Microalb/Creat Ratio: <12 mg/g creat (ref 0–29)
Microalbumin, Urine: <3 ug/mL

## 2020-01-26 ENCOUNTER — Encounter: Payer: Self-pay | Admitting: General Practice

## 2020-01-31 MED FILL — risperiDONE 3 MG TABS: 3 | 30 days supply | Qty: 30 | Fill #3

## 2020-01-31 MED FILL — METFORMIN HCL 500 MG TABS: 500 | 30 days supply | Qty: 120 | Fill #0

## 2020-02-12 IMAGING — DX RIGHT FOOT - 2 VIEW
2 series · 2 of 2 positions shown · non-contrast
Comparison: None.

CLINICAL DATA: Right foot pain.

EXAM:
RIGHT FOOT - 2 VIEW

[foot ap]
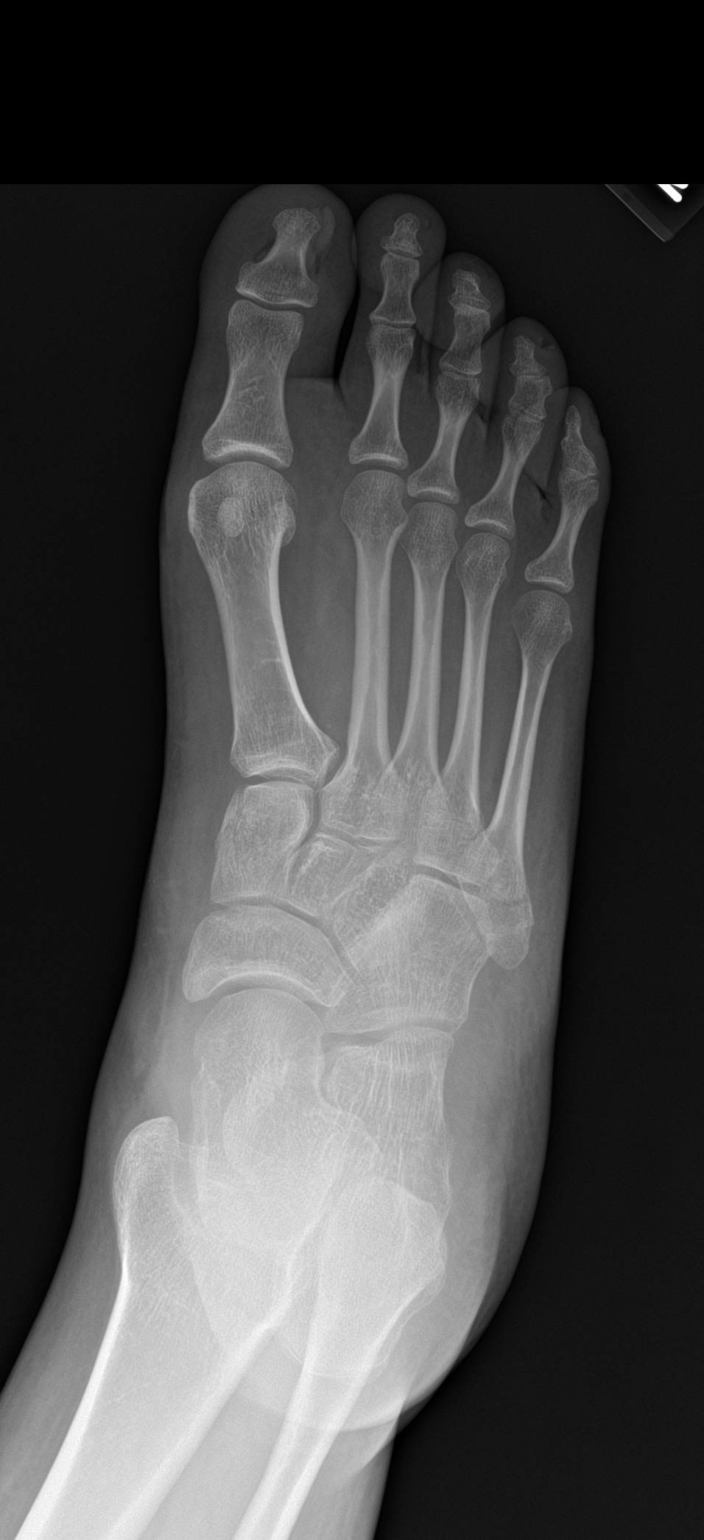

[foot lat]
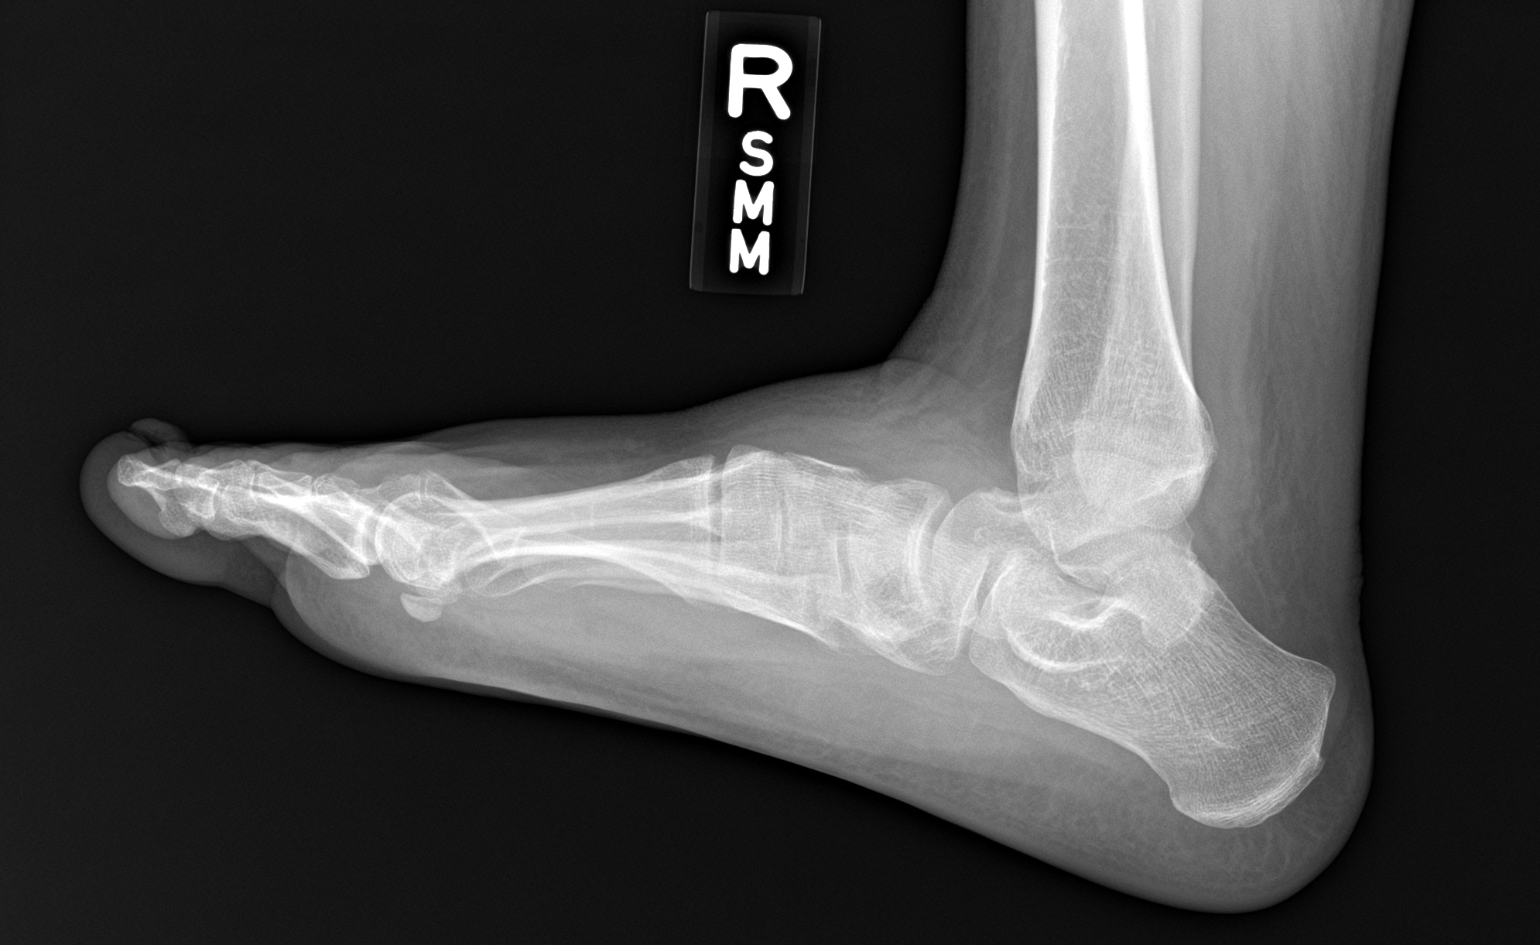

[2 of 2 positions shown; findings below may reference images not displayed]

FINDINGS: There is a nondisplaced fracture of the fifth metatarsal base with
extension to the fifth TMT joint. No additional fracture identified.
Pes planus. Joint space is relatively preserved. Mild soft tissue
prominence over the dorsal lateral aspect of the midfoot and
forefoot.
IMPRESSION: Acute nondisplaced fracture of the fifth metatarsal base.

## 2020-02-17 ENCOUNTER — Other Ambulatory Visit: Payer: Self-pay

## 2020-02-17 ENCOUNTER — Encounter (HOSPITAL_BASED_OUTPATIENT_CLINIC_OR_DEPARTMENT_OTHER): Payer: Self-pay | Admitting: Emergency Medicine

## 2020-02-17 DIAGNOSIS — H5462 Unqualified visual loss, left eye, normal vision right eye: Secondary | ICD-10-CM | POA: Diagnosis present

## 2020-02-17 DIAGNOSIS — Z79899 Other long term (current) drug therapy: Secondary | ICD-10-CM

## 2020-02-17 DIAGNOSIS — Z8261 Family history of arthritis: Secondary | ICD-10-CM

## 2020-02-17 DIAGNOSIS — I5032 Chronic diastolic (congestive) heart failure: Secondary | ICD-10-CM | POA: Diagnosis present

## 2020-02-17 DIAGNOSIS — R001 Bradycardia, unspecified: Secondary | ICD-10-CM | POA: Diagnosis not present

## 2020-02-17 DIAGNOSIS — Z833 Family history of diabetes mellitus: Secondary | ICD-10-CM

## 2020-02-17 DIAGNOSIS — Z8249 Family history of ischemic heart disease and other diseases of the circulatory system: Secondary | ICD-10-CM

## 2020-02-17 DIAGNOSIS — J690 Pneumonitis due to inhalation of food and vomit: Secondary | ICD-10-CM | POA: Diagnosis present

## 2020-02-17 DIAGNOSIS — R6520 Severe sepsis without septic shock: Secondary | ICD-10-CM | POA: Diagnosis present

## 2020-02-17 DIAGNOSIS — N179 Acute kidney failure, unspecified: Secondary | ICD-10-CM | POA: Diagnosis present

## 2020-02-17 DIAGNOSIS — Z7984 Long term (current) use of oral hypoglycemic drugs: Secondary | ICD-10-CM

## 2020-02-17 DIAGNOSIS — E1165 Type 2 diabetes mellitus with hyperglycemia: Secondary | ICD-10-CM | POA: Diagnosis present

## 2020-02-17 DIAGNOSIS — I110 Hypertensive heart disease with heart failure: Secondary | ICD-10-CM | POA: Diagnosis present

## 2020-02-17 DIAGNOSIS — J1282 Pneumonia due to coronavirus disease 2019: Secondary | ICD-10-CM | POA: Diagnosis present

## 2020-02-17 DIAGNOSIS — I9589 Other hypotension: Secondary | ICD-10-CM | POA: Diagnosis present

## 2020-02-17 DIAGNOSIS — J9601 Acute respiratory failure with hypoxia: Secondary | ICD-10-CM | POA: Diagnosis present

## 2020-02-17 DIAGNOSIS — A4189 Other specified sepsis: Principal | ICD-10-CM | POA: Diagnosis present

## 2020-02-17 DIAGNOSIS — U071 COVID-19: Secondary | ICD-10-CM | POA: Diagnosis present

## 2020-02-17 DIAGNOSIS — E860 Dehydration: Secondary | ICD-10-CM | POA: Diagnosis present

## 2020-02-17 DIAGNOSIS — Z885 Allergy status to narcotic agent status: Secondary | ICD-10-CM

## 2020-02-17 DIAGNOSIS — J159 Unspecified bacterial pneumonia: Secondary | ICD-10-CM | POA: Diagnosis present

## 2020-02-17 DIAGNOSIS — F79 Unspecified intellectual disabilities: Secondary | ICD-10-CM | POA: Diagnosis present

## 2020-02-17 DIAGNOSIS — H9190 Unspecified hearing loss, unspecified ear: Secondary | ICD-10-CM | POA: Diagnosis present

## 2020-02-17 DIAGNOSIS — R6250 Unspecified lack of expected normal physiological development in childhood: Secondary | ICD-10-CM | POA: Diagnosis present

## 2020-02-17 DIAGNOSIS — E861 Hypovolemia: Secondary | ICD-10-CM | POA: Diagnosis present

## 2020-02-17 MED ORDER — ACETAMINOPHEN 325 MG PO TABS
650.0000 mg | ORAL_TABLET | Freq: Once | ORAL | Status: AC | PRN
  Administered 2020-02-17: 650 mg via ORAL
  Filled 2020-02-17: qty 2

## 2020-02-17 NOTE — ED Triage Notes (Signed)
Patient is non verbal.  Per family has had a fever for the last five days.  Max of 103 at home.  Given tylenol 400mg  at 1830.  Per family tested positive for covid today at home.  Having fever, chills, and SOB.

## 2020-02-18 ENCOUNTER — Emergency Department (HOSPITAL_BASED_OUTPATIENT_CLINIC_OR_DEPARTMENT_OTHER): Payer: Medicaid Other

## 2020-02-18 ENCOUNTER — Inpatient Hospital Stay (HOSPITAL_BASED_OUTPATIENT_CLINIC_OR_DEPARTMENT_OTHER)
Admission: EM | Admit: 2020-02-18 | Discharge: 2020-02-23 | DRG: 871 | Disposition: A | Discharge status: Home / Self Care | Payer: Medicaid Other | Attending: Internal Medicine | Admitting: Internal Medicine

## 2020-02-18 DIAGNOSIS — J9601 Acute respiratory failure with hypoxia: Secondary | ICD-10-CM

## 2020-02-18 DIAGNOSIS — N179 Acute kidney failure, unspecified: Secondary | ICD-10-CM

## 2020-02-18 DIAGNOSIS — U071 COVID-19: Secondary | ICD-10-CM

## 2020-02-18 DIAGNOSIS — R739 Hyperglycemia, unspecified: Secondary | ICD-10-CM

## 2020-02-18 DIAGNOSIS — E860 Dehydration: Secondary | ICD-10-CM

## 2020-02-18 DIAGNOSIS — D649 Anemia, unspecified: Secondary | ICD-10-CM

## 2020-02-18 DIAGNOSIS — F89 Unspecified disorder of psychological development: Secondary | ICD-10-CM | POA: Diagnosis present

## 2020-02-18 DIAGNOSIS — E861 Hypovolemia: Secondary | ICD-10-CM | POA: Diagnosis present

## 2020-02-18 DIAGNOSIS — J069 Acute upper respiratory infection, unspecified: Secondary | ICD-10-CM

## 2020-02-18 DIAGNOSIS — I9589 Other hypotension: Secondary | ICD-10-CM

## 2020-02-18 DIAGNOSIS — J690 Pneumonitis due to inhalation of food and vomit: Secondary | ICD-10-CM

## 2020-02-18 DIAGNOSIS — A419 Sepsis, unspecified organism: Secondary | ICD-10-CM | POA: Insufficient documentation

## 2020-02-18 DIAGNOSIS — E119 Type 2 diabetes mellitus without complications: Secondary | ICD-10-CM

## 2020-02-18 LAB — URINALYSIS, ROUTINE W REFLEX MICROSCOPIC
Glucose, UA: NEGATIVE mg/dL
Hgb urine dipstick: NEGATIVE
Ketones, ur: 40 mg/dL — AB
Leukocytes,Ua: NEGATIVE
Nitrite: NEGATIVE
Protein, ur: 30 mg/dL — AB
Specific Gravity, Urine: 1.025 (ref 1.005–1.030)
pH: 5 (ref 5.0–8.0)

## 2020-02-18 LAB — PROTIME-INR
INR: 1 (ref 0.8–1.2)
Prothrombin Time: 12.3 seconds (ref 11.4–15.2)

## 2020-02-18 LAB — RESP PANEL BY RT-PCR (FLU A&B, COVID) ARPGX2
Influenza A by PCR: NEGATIVE
Influenza B by PCR: NEGATIVE
SARS Coronavirus 2 by RT PCR: POSITIVE — AB

## 2020-02-18 LAB — APTT: aPTT: 34 seconds (ref 24–36)

## 2020-02-18 LAB — COMPREHENSIVE METABOLIC PANEL
ALT: 24 U/L (ref 0–44)
AST: 24 U/L (ref 15–41)
Albumin: 3.8 g/dL (ref 3.5–5.0)
Alkaline Phosphatase: 28 U/L — ABNORMAL LOW (ref 38–126)
Anion gap: 10 (ref 5–15)
BUN: 29 mg/dL — ABNORMAL HIGH (ref 6–20)
CO2: 22 mmol/L (ref 22–32)
Calcium: 8.4 mg/dL — ABNORMAL LOW (ref 8.9–10.3)
Chloride: 103 mmol/L (ref 98–111)
Creatinine, Ser: 2.13 mg/dL — ABNORMAL HIGH (ref 0.44–1.00)
GFR, Estimated: 30 mL/min — ABNORMAL LOW (ref >60)
Glucose, Bld: 229 mg/dL — ABNORMAL HIGH (ref 70–99)
Potassium: 4.9 mmol/L (ref 3.5–5.1)
Sodium: 135 mmol/L (ref 135–145)
Total Bilirubin: 0.4 mg/dL (ref 0.3–1.2)
Total Protein: 6.9 g/dL (ref 6.5–8.1)

## 2020-02-18 LAB — CBC WITH DIFFERENTIAL/PLATELET
Abs Immature Granulocytes: 0.01 10*3/uL (ref 0.00–0.07)
Basophils Absolute: 0 10*3/uL (ref 0.0–0.1)
Basophils Relative: 0 %
Eosinophils Absolute: 0 10*3/uL (ref 0.0–0.5)
Eosinophils Relative: 0 %
HCT: 30.8 % — ABNORMAL LOW (ref 36.0–46.0)
Hemoglobin: 10.1 g/dL — ABNORMAL LOW (ref 12.0–15.0)
Immature Granulocytes: 0 %
Lymphocytes Relative: 41 %
Lymphs Abs: 1.4 10*3/uL (ref 0.7–4.0)
MCH: 30.9 pg (ref 26.0–34.0)
MCHC: 32.8 g/dL (ref 30.0–36.0)
MCV: 94.2 fL (ref 80.0–100.0)
Monocytes Absolute: 0.2 10*3/uL (ref 0.1–1.0)
Monocytes Relative: 5 %
Neutro Abs: 1.9 10*3/uL (ref 1.7–7.7)
Neutrophils Relative %: 54 %
Platelets: 166 10*3/uL (ref 150–400)
RBC: 3.27 MIL/uL — ABNORMAL LOW (ref 3.87–5.11)
RDW: 13.2 % (ref 11.5–15.5)
WBC: 3.5 10*3/uL — ABNORMAL LOW (ref 4.0–10.5)
nRBC: 0 % (ref 0.0–0.2)

## 2020-02-18 LAB — PROCALCITONIN: Procalcitonin: 6.11 ng/mL

## 2020-02-18 LAB — BASIC METABOLIC PANEL
Anion gap: 8 (ref 5–15)
BUN: 17 mg/dL (ref 6–20)
CO2: 23 mmol/L (ref 22–32)
Calcium: 7.8 mg/dL — ABNORMAL LOW (ref 8.9–10.3)
Chloride: 108 mmol/L (ref 98–111)
Creatinine, Ser: 1.1 mg/dL — ABNORMAL HIGH (ref 0.44–1.00)
GFR, Estimated: >60 mL/min (ref >60)
Glucose, Bld: 123 mg/dL — ABNORMAL HIGH (ref 70–99)
Potassium: 4.4 mmol/L (ref 3.5–5.1)
Sodium: 139 mmol/L (ref 135–145)

## 2020-02-18 LAB — URINALYSIS, MICROSCOPIC (REFLEX)

## 2020-02-18 LAB — LACTIC ACID, PLASMA
Lactic Acid, Venous: 1.3 mmol/L (ref 0.5–1.9)
Lactic Acid, Venous: 1.8 mmol/L (ref 0.5–1.9)

## 2020-02-18 LAB — D-DIMER, QUANTITATIVE: D-Dimer, Quant: 0.35 ug/mL-FEU (ref 0.00–0.50)

## 2020-02-18 LAB — C-REACTIVE PROTEIN: CRP: 8.1 mg/dL — ABNORMAL HIGH (ref <1.0)

## 2020-02-18 LAB — PREGNANCY, URINE: Preg Test, Ur: NEGATIVE

## 2020-02-18 MED ORDER — LACTATED RINGERS IV SOLN: INTRAVENOUS | Status: DC

## 2020-02-18 MED ORDER — CEFTRIAXONE SODIUM 2 G IJ SOLR
2.0000 g | INTRAMUSCULAR | Status: AC
  Administered 2020-02-18 – 2020-02-22 (×5): 2 g via INTRAVENOUS
  Filled 2020-02-18: qty 2
  Filled 2020-02-18: qty 20
  Filled 2020-02-18: qty 2
  Filled 2020-02-18: qty 20
  Filled 2020-02-18: qty 2
  Filled 2020-02-18: qty 20

## 2020-02-18 MED ORDER — ACETAMINOPHEN 650 MG RE SUPP
650.0000 mg | RECTAL | Status: DC | PRN
  Administered 2020-02-18 – 2020-02-19 (×2): 650 mg via RECTAL
  Filled 2020-02-18 (×3): qty 1

## 2020-02-18 MED ORDER — ONDANSETRON HCL 4 MG/2ML IJ SOLN
4.0000 mg | Freq: Once | INTRAMUSCULAR | Status: AC
  Administered 2020-02-18: 4 mg via INTRAVENOUS
  Filled 2020-02-18: qty 2

## 2020-02-18 MED ORDER — LACTATED RINGERS IV BOLUS (SEPSIS)
2000.0000 mL | Freq: Once | INTRAVENOUS | Status: AC
  Administered 2020-02-18: 2000 mL via INTRAVENOUS

## 2020-02-18 MED ORDER — ACETAMINOPHEN 325 MG PO TABS
650.0000 mg | ORAL_TABLET | Freq: Once | ORAL | Status: AC
  Administered 2020-02-18: 650 mg via ORAL
  Filled 2020-02-18: qty 2

## 2020-02-18 MED ORDER — SODIUM CHLORIDE 0.9 % IV SOLN
500.0000 mg | INTRAVENOUS | Status: AC
  Administered 2020-02-18 – 2020-02-22 (×5): 500 mg via INTRAVENOUS
  Filled 2020-02-18 (×5): qty 500

## 2020-02-18 MED ORDER — LACTATED RINGERS IV SOLN: INTRAVENOUS | Status: AC

## 2020-02-18 NOTE — ED Notes (Signed)
Positive covid status confirmed by lab. EDP aware.

## 2020-02-18 NOTE — ED Notes (Signed)
EDP made aware of manual BP

## 2020-02-18 NOTE — Progress Notes (Signed)
Patient SPO2 dropped to between 80 and 86%.  Placed patient back on 2 liter nasal cannula.  Patient's SPO2 increased to 98%.   RT will continue to monitor.

## 2020-02-18 NOTE — ED Provider Notes (Signed)
[facility] Provider Note: Georgena Spurling, MD, FACEP  CSN: [id number] MRN: [id number] ARRIVAL: 02/17/20 at [place]: [place]  Fever  Level five caveat: Mentally retarded; deaf HISTORY OF PRESENT ILLNESS  02/18/20 2:13 AM Makayla Roberts is a 35 y.o. female with severe intellectual impairment, deafness and left eye blindness.  She is here with 5 days of fever as high as 103 at home.  On arrival her temperature was 102.8 and this was treated with Tylenol with improvement.  She has been having fevers, chills and shortness of breath at home.  She has had both decreased appetite and vomiting with attempted feeding.  She had a home test for Covid yesterday which was positive.  Her blood pressure on arrival was noted to be 86/53 at 7:47 PM yesterday.  Her blood pressure on arrival to her room just now was noted to be 60/40.  The sepsis protocol was initiated.    Past Medical History:  Diagnosis Date  . Blind left eye   . Deaf   . Developmental delay   . Diabetes mellitus without complication ([place])   . Mental retardation     Past Surgical History:  Procedure Laterality Date  . None      Family History  Problem Relation Age of Onset  . Hypertension Mother   . Arthritis Mother   . Heart disease Mother   . Diabetes Father   . Hypertension Father     Social History   Tobacco Use  . Smoking status: Never Smoker  . Smokeless tobacco: Never Used  Vaping Use  . Vaping Use: Never used  Substance Use Topics  . Alcohol use: No  . Drug use: No    Prior to Admission medications   Medication Sig Start Date End Date Taking? Authorizing Provider  Blood Glucose Monitoring Suppl (ACCU-CHEK GUIDE) w/Device KIT 1 kit by Does not apply route daily. Use to check blood sugar up to 3 times daily. Patient's meter is broken. Needs Replacement. 10/31/18   Gildardo Pounds, NP  glucose blood (ACCU-CHEK GUIDE) test strip Use to check blood sugar up to 3 times daily.  E11.9 01/08/20   Gildardo Pounds, NP  Lancets MISC 1 kit by Does not apply route daily. Use to check blood sugar up to 3 times daily. E11.9 01/08/20   Gildardo Pounds, NP  lisinopril (ZESTRIL) 5 MG tablet Take 1 tablet (5 mg total) by mouth daily. 01/08/20   Gildardo Pounds, NP  metFORMIN (GLUCOPHAGE) 500 MG tablet Take 2 tablets (1,000 mg total) by mouth 2 (two) times daily with a meal. 01/08/20   Gildardo Pounds, NP  risperiDONE (RISPERDAL) 3 MG tablet TAKE 1/2 TABLET BY MOUTH 2 (TWO) TIMES DAILY. 11/08/19   Ward Givens, NP    Allergies Morphine and related   REVIEW OF SYSTEMS  Cannot assess due to severe mental retardation and deafness.   PHYSICAL EXAMINATION  Initial Vital Signs Blood pressure (!) 91/44, pulse (!) 108, temperature 98.5 F (36.9 C), temperature source Oral, resp. rate 19, height _0  (1.473 m), weight 63.1 kg, SpO2 95 %.  Examination General: Well-developed, well-nourished female in no acute distress; appearance consistent with age of record HENT: normocephalic; atraumatic Eyes: Right pupil round and reactive to light; left pupil obscured by corneal opacity; extraocular muscle function cannot be assessed but the left eye has and inferolateral strabismus Neck: supple Heart: regular rate and rhythm; tachycardia Lungs: clear to auscultation bilaterally Abdomen:  soft; nondistended; nontender; bowel sounds present Extremities: No deformity; full range of motion; pulses normal Neurologic: Patient awake and alert but nonverbal; minimal motor function noted Skin: Warm and dry   RESULTS  Summary of this visit's results, reviewed and interpreted by myself:   EKG Interpretation  Date/Time:  Sunday February 18 2020 02:51:15 EST Ventricular Rate:  106 PR Interval:    QRS Duration: 90 QT Interval:  311 QTC Calculation: 413 R Axis:   47 Text Interpretation: Sinus tachycardia Atrial premature complex RSR' in V1 or V2, right VCD or RVH Baseline wander in  lead(s) V4 V5 V6 No significant change was found Confirmed by Shanon Rosser [id number]) on 02/18/2020 2:59:42 AM      Laboratory Studies: Results for orders placed or performed during the hospital encounter of 02/18/20 (from the past 24 hour(s))  Resp Panel by RT-PCR (Flu A&B, Covid) Nasopharyngeal Swab     Status: Abnormal   Collection Time: 02/18/20  2:33 AM   Specimen: Nasopharyngeal Swab; Nasopharyngeal(NP) swabs in vial transport medium  Result Value Ref Range   SARS Coronavirus 2 by RT PCR POSITIVE (A) NEGATIVE   Influenza A by PCR NEGATIVE NEGATIVE   Influenza B by PCR NEGATIVE NEGATIVE  Lactic acid, plasma     Status: None   Collection Time: 02/18/20  2:56 AM  Result Value Ref Range   Lactic Acid, Venous 1.3 0.5 - 1.9 mmol/L  Comprehensive metabolic panel     Status: Abnormal   Collection Time: 02/18/20  2:56 AM  Result Value Ref Range   Sodium 135 135 - 145 mmol/L   Potassium 4.9 3.5 - 5.1 mmol/L   Chloride 103 98 - 111 mmol/L   CO2 22 22 - 32 mmol/L   Glucose, Bld 229 (H) 70 - 99 mg/dL   BUN 29 (H) 6 - 20 mg/dL   Creatinine, Ser 2.13 (H) 0.44 - 1.00 mg/dL   Calcium 8.4 (L) 8.9 - 10.3 mg/dL   Total Protein 6.9 6.5 - 8.1 g/dL   Albumin 3.8 3.5 - 5.0 g/dL   AST 24 15 - 41 U/L   ALT 24 0 - 44 U/L   Alkaline Phosphatase 28 (L) 38 - 126 U/L   Total Bilirubin 0.4 0.3 - 1.2 mg/dL   GFR, Estimated 30 (L) >60 mL/min   Anion gap 10 5 - 15  CBC WITH DIFFERENTIAL     Status: Abnormal   Collection Time: 02/18/20  2:56 AM  Result Value Ref Range   WBC 3.5 (L) 4.0 - 10.5 K/uL   RBC 3.27 (L) 3.87 - 5.11 MIL/uL   Hemoglobin 10.1 (L) 12.0 - 15.0 g/dL   HCT 30.8 (L) 36.0 - 46.0 %   MCV 94.2 80.0 - 100.0 fL   MCH 30.9 26.0 - 34.0 pg   MCHC 32.8 30.0 - 36.0 g/dL   RDW 13.2 11.5 - 15.5 %   Platelets 166 150 - 400 K/uL   nRBC 0.0 0.0 - 0.2 %   Neutrophils Relative % 54 %   Neutro Abs 1.9 1.7 - 7.7 K/uL   Lymphocytes Relative 41 %   Lymphs Abs 1.4 0.7 - 4.0 K/uL   Monocytes Relative 5  %   Monocytes Absolute 0.2 0.1 - 1.0 K/uL   Eosinophils Relative 0 %   Eosinophils Absolute 0.0 0.0 - 0.5 K/uL   Basophils Relative 0 %   Basophils Absolute 0.0 0.0 - 0.1 K/uL   Immature Granulocytes 0 %   Abs Immature Granulocytes 0.01  0.00 - 0.07 K/uL  Protime-INR     Status: None   Collection Time: 02/18/20  2:56 AM  Result Value Ref Range   Prothrombin Time 12.3 11.4 - 15.2 seconds   INR 1.0 0.8 - 1.2  APTT     Status: None   Collection Time: 02/18/20  2:56 AM  Result Value Ref Range   aPTT 34 24 - 36 seconds  Urinalysis, Routine w reflex microscopic     Status: Abnormal   Collection Time: 02/18/20  2:56 AM  Result Value Ref Range   Color, Urine YELLOW YELLOW   APPearance HAZY (A) CLEAR   Specific Gravity, Urine 1.025 1.005 - 1.030   pH 5.0 5.0 - 8.0   Glucose, UA NEGATIVE NEGATIVE mg/dL   Hgb urine dipstick NEGATIVE NEGATIVE   Bilirubin Urine MODERATE (A) NEGATIVE   Ketones, ur 40 (A) NEGATIVE mg/dL   Protein, ur 30 (A) NEGATIVE mg/dL   Nitrite NEGATIVE NEGATIVE   Leukocytes,Ua NEGATIVE NEGATIVE  Pregnancy, urine     Status: None   Collection Time: 02/18/20  2:56 AM  Result Value Ref Range   Preg Test, Ur NEGATIVE NEGATIVE  Urinalysis, Microscopic (reflex)     Status: Abnormal   Collection Time: 02/18/20  2:56 AM  Result Value Ref Range   RBC / HPF 0-5 0 - 5 RBC/hpf   WBC, UA 0-5 0 - 5 WBC/hpf   Bacteria, UA FEW (A) NONE SEEN   Squamous Epithelial / LPF 0-5 0 - 5   Amorphous Crystal PRESENT    Ca Oxalate Crys, UA PRESENT   Lactic acid, plasma     Status: None   Collection Time: 02/18/20  5:38 AM  Result Value Ref Range   Lactic Acid, Venous 1.8 0.5 - 1.9 mmol/L   Imaging Studies: DG Chest Port 1 View  Result Date: 02/18/2020 CLINICAL DATA:  COVID positive EXAM: PORTABLE CHEST 1 VIEW COMPARISON:  None. FINDINGS: The heart size and mediastinal contours are within normal limits. Both lungs are clear. The visualized skeletal structures are unremarkable. IMPRESSION:  No active disease. Electronically Signed   By: Prudencio Pair M.D.   On: 02/18/2020 02:56    ED COURSE and MDM  Nursing notes, initial and subsequent vitals signs, including pulse oximetry, reviewed and interpreted by myself.  Vitals:   02/18/20 0430 02/18/20 0515 02/18/20 0600 02/18/20 0630  BP:  (!) 89/66 (!) 87/63 91/61  Pulse: 87 97 98 98  Resp: 17 19 (!) 22 (!) 23  Temp:      TempSrc:      SpO2: 99% 94% 93% 95%  Weight:      Height:       Medications  lactated ringers infusion ( Intravenous New Bag/Given 02/18/20 0541)  acetaminophen (TYLENOL) tablet 650 mg (650 mg Oral Given 02/17/20 1957)  lactated ringers bolus 2,000 mL (0 mLs Intravenous Stopped 02/18/20 0538)  ondansetron (ZOFRAN) injection 4 mg (4 mg Intravenous Given 02/18/20 0307)   4:55 AM Patient is positive for Covid and has an acute kidney injury likely due to dehydration.  She has been given 2 L of lactated Ringer's and we will have her admitted.   PROCEDURES  Procedures CRITICAL CARE Performed by: Karen Chafe Katrisha Segall Total critical care time: 30 minutes Critical care time was exclusive of separately billable procedures and treating other patients. Critical care was necessary to treat or prevent imminent or life-threatening deterioration. Critical care was time spent personally by me on the following activities: development of  treatment plan with patient and/or surrogate as well as nursing, discussions with consultants, evaluation of patient's response to treatment, examination of patient, obtaining history from patient or surrogate, ordering and performing treatments and interventions, ordering and review of laboratory studies, ordering and review of radiographic studies, pulse oximetry and re-evaluation of patient's condition.   ED DIAGNOSES     ICD-10-CM   1. COVID-19 virus infection  U07.1   2. AKI (acute kidney injury) ([place])  N17.9   3. Dehydration  E86.0   4. Hyperglycemia  R73.9   5. Normocytic anemia  D64.9   6.  Hypotension due to hypovolemia  I95.89    E86.1        Quanda Pavlicek, Jenny Reichmann, MD 02/18/20 [phone]

## 2020-02-18 NOTE — ED Notes (Signed)
Her breathing continues to be unlabored and we have been able to reduce her O2 to 1 I.p.m. Her mother remains with her at all times and is very caring and attentive.

## 2020-02-18 NOTE — ED Notes (Signed)
I asked Dr. Thurnell Lose to review pt's. IV fluid status and to see if there were any necessary blood tests needed. Orders were received and completed.

## 2020-02-18 NOTE — ED Notes (Signed)
D/t slowly declining SPO2, our R.T. Lequita Halt places pt. On O2 at 2 l.p.m.  Pt's. Mother calls an interpreter and asks why pt. Is being admitted to hospital. I explain that it is d't dx of covid + need of supplemental O2 and rapid breathing. She seem satisfied with my answers.

## 2020-02-19 ENCOUNTER — Encounter (HOSPITAL_COMMUNITY): Payer: Self-pay | Admitting: Family Medicine

## 2020-02-19 DIAGNOSIS — F79 Unspecified intellectual disabilities: Secondary | ICD-10-CM | POA: Diagnosis present

## 2020-02-19 DIAGNOSIS — R6520 Severe sepsis without septic shock: Secondary | ICD-10-CM | POA: Insufficient documentation

## 2020-02-19 DIAGNOSIS — J690 Pneumonitis due to inhalation of food and vomit: Secondary | ICD-10-CM | POA: Diagnosis present

## 2020-02-19 DIAGNOSIS — J9601 Acute respiratory failure with hypoxia: Secondary | ICD-10-CM | POA: Diagnosis present

## 2020-02-19 DIAGNOSIS — Z833 Family history of diabetes mellitus: Secondary | ICD-10-CM | POA: Diagnosis not present

## 2020-02-19 DIAGNOSIS — A419 Sepsis, unspecified organism: Secondary | ICD-10-CM | POA: Insufficient documentation

## 2020-02-19 DIAGNOSIS — I110 Hypertensive heart disease with heart failure: Secondary | ICD-10-CM | POA: Diagnosis present

## 2020-02-19 DIAGNOSIS — E861 Hypovolemia: Secondary | ICD-10-CM | POA: Diagnosis present

## 2020-02-19 DIAGNOSIS — I9589 Other hypotension: Secondary | ICD-10-CM

## 2020-02-19 DIAGNOSIS — R001 Bradycardia, unspecified: Secondary | ICD-10-CM | POA: Diagnosis not present

## 2020-02-19 DIAGNOSIS — H5462 Unqualified visual loss, left eye, normal vision right eye: Secondary | ICD-10-CM | POA: Diagnosis present

## 2020-02-19 DIAGNOSIS — J069 Acute upper respiratory infection, unspecified: Secondary | ICD-10-CM | POA: Diagnosis not present

## 2020-02-19 DIAGNOSIS — R6250 Unspecified lack of expected normal physiological development in childhood: Secondary | ICD-10-CM | POA: Diagnosis present

## 2020-02-19 DIAGNOSIS — Z7984 Long term (current) use of oral hypoglycemic drugs: Secondary | ICD-10-CM | POA: Diagnosis not present

## 2020-02-19 DIAGNOSIS — U071 COVID-19: Secondary | ICD-10-CM

## 2020-02-19 DIAGNOSIS — H9190 Unspecified hearing loss, unspecified ear: Secondary | ICD-10-CM | POA: Diagnosis present

## 2020-02-19 DIAGNOSIS — E860 Dehydration: Secondary | ICD-10-CM | POA: Diagnosis present

## 2020-02-19 DIAGNOSIS — E1165 Type 2 diabetes mellitus with hyperglycemia: Secondary | ICD-10-CM | POA: Diagnosis present

## 2020-02-19 DIAGNOSIS — N179 Acute kidney failure, unspecified: Secondary | ICD-10-CM | POA: Diagnosis present

## 2020-02-19 DIAGNOSIS — Z8249 Family history of ischemic heart disease and other diseases of the circulatory system: Secondary | ICD-10-CM | POA: Diagnosis not present

## 2020-02-19 DIAGNOSIS — Z8261 Family history of arthritis: Secondary | ICD-10-CM | POA: Diagnosis not present

## 2020-02-19 DIAGNOSIS — F89 Unspecified disorder of psychological development: Secondary | ICD-10-CM

## 2020-02-19 DIAGNOSIS — J1282 Pneumonia due to coronavirus disease 2019: Secondary | ICD-10-CM | POA: Diagnosis present

## 2020-02-19 DIAGNOSIS — A4189 Other specified sepsis: Secondary | ICD-10-CM | POA: Diagnosis present

## 2020-02-19 DIAGNOSIS — J159 Unspecified bacterial pneumonia: Secondary | ICD-10-CM | POA: Diagnosis present

## 2020-02-19 DIAGNOSIS — Z79899 Other long term (current) drug therapy: Secondary | ICD-10-CM | POA: Diagnosis not present

## 2020-02-19 DIAGNOSIS — I5032 Chronic diastolic (congestive) heart failure: Secondary | ICD-10-CM | POA: Diagnosis present

## 2020-02-19 DIAGNOSIS — E119 Type 2 diabetes mellitus without complications: Secondary | ICD-10-CM

## 2020-02-19 LAB — COMPREHENSIVE METABOLIC PANEL
ALT: 20 U/L (ref 0–44)
AST: 23 U/L (ref 15–41)
Albumin: 3 g/dL — ABNORMAL LOW (ref 3.5–5.0)
Alkaline Phosphatase: 24 U/L — ABNORMAL LOW (ref 38–126)
Anion gap: 12 (ref 5–15)
BUN: 11 mg/dL (ref 6–20)
CO2: 22 mmol/L (ref 22–32)
Calcium: 8.1 mg/dL — ABNORMAL LOW (ref 8.9–10.3)
Chloride: 106 mmol/L (ref 98–111)
Creatinine, Ser: 0.72 mg/dL (ref 0.44–1.00)
GFR, Estimated: >60 mL/min (ref >60)
Glucose, Bld: 150 mg/dL — ABNORMAL HIGH (ref 70–99)
Potassium: 4 mmol/L (ref 3.5–5.1)
Sodium: 140 mmol/L (ref 135–145)
Total Bilirubin: 0.5 mg/dL (ref 0.3–1.2)
Total Protein: 6.1 g/dL — ABNORMAL LOW (ref 6.5–8.1)

## 2020-02-19 LAB — URINE CULTURE: Culture: NO GROWTH

## 2020-02-19 LAB — D-DIMER, QUANTITATIVE: D-Dimer, Quant: 0.45 ug/mL-FEU (ref 0.00–0.50)

## 2020-02-19 LAB — CBG MONITORING, ED: Glucose-Capillary: 130 mg/dL — ABNORMAL HIGH (ref 70–99)

## 2020-02-19 LAB — FERRITIN: Ferritin: 217 ng/mL (ref 11–307)

## 2020-02-19 LAB — CBC WITH DIFFERENTIAL/PLATELET
Abs Immature Granulocytes: 0 10*3/uL (ref 0.00–0.07)
Band Neutrophils: 9 %
Basophils Absolute: 0 10*3/uL (ref 0.0–0.1)
Basophils Relative: 0 %
Eosinophils Absolute: 0 10*3/uL (ref 0.0–0.5)
Eosinophils Relative: 1 %
HCT: 31.6 % — ABNORMAL LOW (ref 36.0–46.0)
Hemoglobin: 10.4 g/dL — ABNORMAL LOW (ref 12.0–15.0)
Lymphocytes Relative: 25 %
Lymphs Abs: 0.8 10*3/uL (ref 0.7–4.0)
MCH: 30.8 pg (ref 26.0–34.0)
MCHC: 32.9 g/dL (ref 30.0–36.0)
MCV: 93.5 fL (ref 80.0–100.0)
Monocytes Absolute: 0.3 10*3/uL (ref 0.1–1.0)
Monocytes Relative: 8 %
Neutro Abs: 2.2 10*3/uL (ref 1.7–7.7)
Neutrophils Relative %: 57 %
Platelets: 175 10*3/uL (ref 150–400)
RBC: 3.38 MIL/uL — ABNORMAL LOW (ref 3.87–5.11)
RDW: 12.9 % (ref 11.5–15.5)
WBC: 3.3 10*3/uL — ABNORMAL LOW (ref 4.0–10.5)
nRBC: 0 % (ref 0.0–0.2)

## 2020-02-19 LAB — GLUCOSE, CAPILLARY
Glucose-Capillary: 123 mg/dL — ABNORMAL HIGH (ref 70–99)
Glucose-Capillary: 138 mg/dL — ABNORMAL HIGH (ref 70–99)
Glucose-Capillary: 132 mg/dL — ABNORMAL HIGH (ref 70–99)
Glucose-Capillary: 120 mg/dL — ABNORMAL HIGH (ref 70–99)

## 2020-02-19 LAB — STREP PNEUMONIAE URINARY ANTIGEN: Strep Pneumo Urinary Antigen: NEGATIVE

## 2020-02-19 LAB — PROCALCITONIN: Procalcitonin: 1.08 ng/mL

## 2020-02-19 LAB — MRSA PCR SCREENING: MRSA by PCR: NEGATIVE

## 2020-02-19 LAB — HIV ANTIBODY (ROUTINE TESTING W REFLEX): HIV Screen 4th Generation wRfx: NONREACTIVE

## 2020-02-19 LAB — LACTATE DEHYDROGENASE: LDH: 163 U/L (ref 98–192)

## 2020-02-19 LAB — C-REACTIVE PROTEIN: CRP: 4.9 mg/dL — ABNORMAL HIGH (ref <1.0)

## 2020-02-19 LAB — FIBRINOGEN: Fibrinogen: 382 mg/dL (ref 210–475)

## 2020-02-19 MED ORDER — CHLORHEXIDINE GLUCONATE CLOTH 2 % EX PADS
6.0000 | MEDICATED_PAD | Freq: Every day | CUTANEOUS | Status: DC
  Administered 2020-02-19 – 2020-02-23 (×5): 6 via TOPICAL

## 2020-02-19 MED ORDER — ORAL CARE MOUTH RINSE
15.0000 mL | Freq: Two times a day (BID) | OROMUCOSAL | Status: DC
  Administered 2020-02-19 – 2020-02-23 (×9): 15 mL via OROMUCOSAL

## 2020-02-19 MED ORDER — PREDNISONE 20 MG PO TABS
50.0000 mg | ORAL_TABLET | Freq: Every day | ORAL | Status: DC
  Administered 2020-02-22 – 2020-02-23 (×2): 50 mg via ORAL
  Filled 2020-02-19 (×2): qty 2

## 2020-02-19 MED ORDER — SODIUM CHLORIDE 0.9% FLUSH
3.0000 mL | Freq: Two times a day (BID) | INTRAVENOUS | Status: DC
  Administered 2020-02-19 – 2020-02-23 (×9): 3 mL via INTRAVENOUS

## 2020-02-19 MED ORDER — FLUCONAZOLE 100MG IVPB
150.0000 mg | Freq: Once | INTRAVENOUS | Status: AC
  Administered 2020-02-19: 150 mg via INTRAVENOUS
  Filled 2020-02-19: qty 75

## 2020-02-19 MED ORDER — METHYLPREDNISOLONE SODIUM SUCC 40 MG IJ SOLR
30.0000 mg | Freq: Two times a day (BID) | INTRAMUSCULAR | Status: AC
  Administered 2020-02-19 – 2020-02-21 (×6): 30 mg via INTRAVENOUS
  Filled 2020-02-19 (×6): qty 1

## 2020-02-19 MED ORDER — SODIUM CHLORIDE 0.9 % IV SOLN
100.0000 mg | Freq: Every day | INTRAVENOUS | Status: DC
  Administered 2020-02-20 – 2020-02-22 (×3): 100 mg via INTRAVENOUS
  Filled 2020-02-19 (×3): qty 20

## 2020-02-19 MED ORDER — ALBUTEROL SULFATE HFA 108 (90 BASE) MCG/ACT IN AERS: 1.0000 | INHALATION_SPRAY | RESPIRATORY_TRACT | Status: DC | PRN

## 2020-02-19 MED ORDER — INSULIN DETEMIR 100 UNIT/ML ~~LOC~~ SOLN
0.1000 [IU]/kg | Freq: Two times a day (BID) | SUBCUTANEOUS | Status: DC
  Administered 2020-02-19 – 2020-02-23 (×9): 6 [IU] via SUBCUTANEOUS
  Filled 2020-02-19 (×10): qty 0.06

## 2020-02-19 MED ORDER — ONDANSETRON HCL 4 MG/2ML IJ SOLN: 4.0000 mg | Freq: Four times a day (QID) | INTRAMUSCULAR | Status: DC | PRN

## 2020-02-19 MED ORDER — INSULIN ASPART 100 UNIT/ML ~~LOC~~ SOLN
0.0000 [IU] | Freq: Three times a day (TID) | SUBCUTANEOUS | Status: DC
  Administered 2020-02-19 – 2020-02-20 (×4): 2 [IU] via SUBCUTANEOUS
  Administered 2020-02-21 (×3): 3 [IU] via SUBCUTANEOUS
  Administered 2020-02-22 (×2): 5 [IU] via SUBCUTANEOUS
  Administered 2020-02-22 – 2020-02-23 (×2): 3 [IU] via SUBCUTANEOUS

## 2020-02-19 MED ORDER — GUAIFENESIN-DM 100-10 MG/5ML PO SYRP: 10.0000 mL | ORAL_SOLUTION | ORAL | Status: DC | PRN

## 2020-02-19 MED ORDER — ONDANSETRON HCL 4 MG PO TABS: 4.0000 mg | ORAL_TABLET | Freq: Four times a day (QID) | ORAL | Status: DC | PRN

## 2020-02-19 MED ORDER — ENOXAPARIN SODIUM 40 MG/0.4ML ~~LOC~~ SOLN
40.0000 mg | Freq: Every day | SUBCUTANEOUS | Status: DC
  Administered 2020-02-19 – 2020-02-23 (×5): 40 mg via SUBCUTANEOUS
  Filled 2020-02-19 (×5): qty 0.4

## 2020-02-19 MED ORDER — ACETAMINOPHEN 325 MG PO TABS: 650.0000 mg | ORAL_TABLET | Freq: Four times a day (QID) | ORAL | Status: DC | PRN

## 2020-02-19 MED ORDER — SODIUM CHLORIDE 0.9 % IV SOLN
200.0000 mg | Freq: Once | INTRAVENOUS | Status: AC
  Administered 2020-02-19: 200 mg via INTRAVENOUS
  Filled 2020-02-19: qty 200

## 2020-02-19 MED ORDER — RISPERIDONE 1 MG PO TABS
1.5000 mg | ORAL_TABLET | Freq: Two times a day (BID) | ORAL | Status: DC
  Administered 2020-02-22 – 2020-02-23 (×3): 1.5 mg via ORAL
  Filled 2020-02-19 (×5): qty 2

## 2020-02-19 MED ORDER — LACTATED RINGERS IV SOLN: INTRAVENOUS | Status: AC

## 2020-02-19 NOTE — ED Notes (Addendum)
This RN noticed pt had no documented output; pt is incontinent; pt had not had any output with this RN, so RN and EMT bladder scanned, found ; messaged admitting doctor

## 2020-02-19 NOTE — ED Notes (Signed)
Used translator with pt mother, explained that she would not be able to ride with the pt to the hospital, but that she will be allowed to be with the pt in the hospital room as long as she abides their rules; pt mother agreeable and says she will have her son pick her up and take her

## 2020-02-19 NOTE — Progress Notes (Signed)
Patient has not voided since her admission this AM. Bladder scan reading >534. In and out cath attempted, first attempt in vagina. Thick, white discharge noted from vaginal opening. MD notified, will culture and start on empiric fluconazole per orders. Second in and out successful. 750 cc of clear yellow urine returned. Asked mother if patient typically retains urine, she states that patient typically does not and usually wets her pull up every 1-2 hours. MD notified of urinary retention. UA obtained in ED, no need for repeat UA according to MD at this time. Will continue to monitor.

## 2020-02-19 NOTE — Progress Notes (Signed)
Allowing for mother to visit despite patient's COVID (+) status and mother's presumed positive status due to patient being developmentally delayed, mute, blind, and deaf. Instructed mother that in order to maintain a clean environment she must stay in the room at all times. Instructed that if she wishes to leave she must call out for nursing staff to assist her in donning appropriate PPE to walk through the halls to avoid COVID exposure. Explanation given via Print production planner, mother states that she understands. Will continue to monitor at this time.

## 2020-02-19 NOTE — H&P (Signed)
History and Physical        Hospital Admission Note Date: 02/19/2020  Patient name: Makayla Roberts Medical record number: [id number] Date of birth: [birth year] Age: 35 y.o. Gender: female  PCP: Gildardo Pounds, NP  Patient coming from: home Lives with: mother At baseline, ambulates: independently  Chief Complaint    Chief Complaint  Patient presents with  . Fever      HPI:   History obtained from mother at bedside via [place] interpreter due to patient's history of intellectual delay and deafness  This is a 35 year old female with past medical history of developmental delay, left eye blindness, deafness, HFpEF, diabetes who presented to [facility] with a fever x5 days.  Fever has been up to 103 F at home and associated with chills and shortness of breath, diarrhea, decreased appetite and vomiting.  Mother states that the patient has been choking when she eats as well.  Found to be COVID-19 positive on home test.     ED Course: Febrile, tachycardic, tachypneic,  Hypotensive (62/35 which improved with IV fluid bolus), hypoxic (SpO2 87% on room air) placed on 2 L/min. Notable Labs: Sodium 135, K4.9, glucose 229, BUN 29, creatinine 2.13, lactic acid 1.3, WBC 3.5, Hb 10.1, CRP 8.1, procalcitonin 6.11, COVID-19 positive. Notable Imaging: CXR unremarkable. Patient received Tylenol, ceftriaxone, azithromycin, 2 L LR bolus and maintenance.    Vitals:   02/19/20 0800 02/19/20 1001  BP: 119/90 118/86  Pulse: 93 88  Resp: 17 (!) 21  Temp:  98.6 F (37 C)  SpO2: 98% 96%     Review of Systems:  Review of Systems  Unable to perform ROS: Patient nonverbal    Medical/Social/Family History   Past Medical History: Past Medical History:  Diagnosis Date  . Blind left eye   . Deaf   . Developmental delay   . Diabetes mellitus without complication ([place])   . Mental retardation     Past  Surgical History:  Procedure Laterality Date  . None      Medications: Prior to Admission medications   Medication Sig Start Date End Date Taking? Authorizing Provider  Blood Glucose Monitoring Suppl (ACCU-CHEK GUIDE) w/Device KIT 1 kit by Does not apply route daily. Use to check blood sugar up to 3 times daily. Patient's meter is broken. Needs Replacement. 10/31/18   Gildardo Pounds, NP  glucose blood (ACCU-CHEK GUIDE) test strip Use to check blood sugar up to 3 times daily. E11.9 01/08/20   Gildardo Pounds, NP  Lancets MISC 1 kit by Does not apply route daily. Use to check blood sugar up to 3 times daily. E11.9 01/08/20   Gildardo Pounds, NP  lisinopril (ZESTRIL) 5 MG tablet Take 1 tablet (5 mg total) by mouth daily. 01/08/20   Gildardo Pounds, NP  metFORMIN (GLUCOPHAGE) 500 MG tablet Take 2 tablets (1,000 mg total) by mouth 2 (two) times daily with a meal. 01/08/20   Gildardo Pounds, NP  risperiDONE (RISPERDAL) 3 MG tablet TAKE 1/2 TABLET BY MOUTH 2 (TWO) TIMES DAILY. 11/08/19   Ward Givens, NP    Allergies:   Allergies  Allergen Reactions  . Morphine And Related Shortness Of Breath    Social  History:  reports that she has never smoked. She has never used smokeless tobacco. She reports that she does not drink alcohol and does not use drugs.  Family History: Family History  Problem Relation Age of Onset  . Hypertension Mother   . Arthritis Mother   . Heart disease Mother   . Diabetes Father   . Hypertension Father      Objective   Physical Exam: Blood pressure 118/86, pulse 88, temperature 98.6 F (37 C), temperature source Axillary, resp. rate (!) 21, height _0  (1.473 m), weight 63.1 kg, SpO2 96 %.  Physical Exam Vitals and nursing note reviewed. Exam conducted with a chaperone present.  Constitutional:      General: She is not in acute distress.    Appearance: Normal appearance. She is not toxic-appearing.     Comments: Sleeping comfortably  HENT:      Head: Normocephalic and atraumatic.  Eyes:     Conjunctiva/sclera: Conjunctivae normal.  Cardiovascular:     Rate and Rhythm: Normal rate.     Pulses: Normal pulses.  Pulmonary:     Effort: Pulmonary effort is normal. No respiratory distress.  Abdominal:     General: Abdomen is flat.     Palpations: Abdomen is soft.  Musculoskeletal:        General: No swelling or tenderness.     Right lower leg: No edema.     Left lower leg: No edema.  Skin:    Coloration: Skin is not jaundiced or pale.  Neurological:     Mental Status: She is alert. Mental status is at baseline.     LABS on Admission: I have personally reviewed all the labs and imaging below    Basic Metabolic Panel: Recent Labs  Lab 02/18/20 0256 02/18/20 1726  NA 135 139  K 4.9 4.4  CL 103 108  CO2 22 23  GLUCOSE 229* 123*  BUN 29* 17  CREATININE 2.13* 1.10*  CALCIUM 8.4* 7.8*   Liver Function Tests: Recent Labs  Lab 02/18/20 0256  AST 24  ALT 24  ALKPHOS 28*  BILITOT 0.4  PROT 6.9  ALBUMIN 3.8   No results for input(s): LIPASE, AMYLASE in the last 168 hours. No results for input(s): AMMONIA in the last 168 hours. CBC: Recent Labs  Lab 02/18/20 0256  WBC 3.5*  NEUTROABS 1.9  HGB 10.1*  HCT 30.8*  MCV 94.2  PLT 166   Cardiac Enzymes: No results for input(s): CKTOTAL, CKMB, CKMBINDEX, TROPONINI in the last 168 hours. BNP: Invalid input(s): POCBNP CBG: Recent Labs  Lab 02/19/20 0759  GLUCAP 130*    Radiological Exams on Admission:  DG Chest Port 1 View  Result Date: 02/18/2020 CLINICAL DATA:  COVID positive EXAM: PORTABLE CHEST 1 VIEW COMPARISON:  None. FINDINGS: The heart size and mediastinal contours are within normal limits. Both lungs are clear. The visualized skeletal structures are unremarkable. IMPRESSION: No active disease. Electronically Signed   By: Prudencio Pair M.D.   On: 02/18/2020 02:56      EKG: sinus tachycardia   A & P   Principal Problem:   Acute respiratory  disease due to COVID-19 virus Active Problems:   Developmental disability   Type 2 diabetes mellitus without complication (HCC)   Hypotension due to hypovolemia   Acute respiratory failure with hypoxia (HCC)   Severe sepsis (HCC)   Aspiration pneumonia (HCC)   AKI (acute kidney injury) ([place])   1. Severe sepsis without septic shock  Acute hypoxic respiratory failure secondary to COVID-19 and suspected aspiration pneumonia a. Sepsis criteria: Febrile, tachycardic, tachypneic, leukopenia, hypotensive b. Desaturates to mid to high 80s on room air and comfortable on 2 L/min c. Concern for aspiration pneumonia-coughs while eating, procalcitonin 6.11, hypoxic.  Hold immunomodulators d. Follow-up cultures e. Trend inflammatory markers, currently pending f. Start remdesivir and steroids g. Continue ceftriaxone and azithromycin h. N.p.o. pending SLP eval i. As needed inhalers j. Incentive spirometry and flutter valve if able to  2. Hypotension due to hypovolemia a. Initial BPs in the 60s /30s b. Resolved with IV bolus and maintenance c. Continue maintenance fluids d. Hold antihypertensives  3. AKI likely multifactorial: Lisinopril, hemodynamic changes, sepsis, hypovolemia.  Improving a. Creatinine 2.13 at presentation, baseline 0.7 b. Hold lisinopril c. Follow-up after IV fluids  4. Hypertension a. Hold lisinopril  5. Diabetes a. Hold home Metformin b. Start insulin per COVID-19 steroid order set  6. History of developmental delay a. Continue home Risperdal     DVT prophylaxis: Lovenox   Code Status: Full Code  Diet: N.p.o. Family Communication: Admission, patients condition and plan of care including tests being ordered have been discussed with the patient who indicates understanding and agrees with the plan and Code Status. Patient's mother was updated  Disposition Plan: The appropriate patient status for this patient is INPATIENT. Inpatient status is judged to be  reasonable and necessary in order to provide the required intensity of service to ensure the patient's safety. The patient's presenting symptoms, physical exam findings, and initial radiographic and laboratory data in the context of their chronic comorbidities is felt to place them at high risk for further clinical deterioration. Furthermore, it is not anticipated that the patient will be medically stable for discharge from the hospital within 2 midnights of admission. The following factors support the patient status of inpatient.   " The patient's presenting symptoms include fever. " The worrisome physical exam findings include hypotension and hypoxia. " The initial radiographic and laboratory data are worrisome because of COVID-19 positive, AKI. " The chronic co-morbidities include developmental delay.   * I certify that at the point of admission it is my clinical judgment that the patient will require inpatient hospital care spanning beyond 2 midnights from the point of admission due to high intensity of service, high risk for further deterioration and high frequency of surveillance required.*   The medical decision making on this patient was of high complexity and the patient is at high risk for clinical deterioration, therefore this is a level 3 admission.  Consultants  . None  Procedures  . None  Time Spent on Admission: 70 minutes    Harold Hedge, DO [facility]  02/19/2020, 10:28 AM

## 2020-02-19 NOTE — Evaluation (Signed)
Clinical/Bedside Swallow Evaluation Patient Details  Name: Velvet Moomaw MRN: [id number] Date of Birth: Oct 13, 1985  Today's Date: 02/19/2020 Time: SLP Start Time (ACUTE ONLY): 1630 SLP Stop Time (ACUTE ONLY): 1710 SLP Time Calculation (min) (ACUTE ONLY): 40 min  Past Medical History:  Past Medical History:  Diagnosis Date  . Blind left eye   . Deaf   . Developmental delay   . Diabetes mellitus without complication (HCC)   . Mental retardation    Past Surgical History:  Past Surgical History:  Procedure Laterality Date  . None     HPI:  35 yo female with h/o MR, nonverbal, DM, blind in left eye, - per mother pt can hear but does not speak- admitted after five days of fever and N/V in setting of COVID 19 and was hypotensive with AKI upon admit.  CXR clear.  Pt is on medications for COVID and IV fluids.  Swallow eval ordered.  Per mom *with use of translator" pt has had dysphagia for months - causing her to choke and cough - She states pt does not spit out the food or drink however.  She has not required the heimlich manuever.  Pt CXR negative but pt coughing with po.  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 Interpreter May #[id number] used during session on Ipad.   Mom says pt is not deaf, but she is nonverbal.   Assessment / Plan / Recommendation Clinical Impression  Pt in bed, asleep upon arrival to room but awoke with verbal and tactile stimulation. SLP repositioned pt upright and interviewed pt's mom re: pt's swallowing function.  Pt initially awake and accepted intake of tsp of nectar juice - clinically appeared with delayed swallow with delayed cough.  No gagging noted with intake.  Pt then offered cup bolus to determine if larger bolus would elicit stronger swallow but pt declined to consume by turning her head away.  SLP suspects displeasure with taste* as she winced and chuckled.  She quickly became sleepy  - closing her eyes and nodding off -  demonstrating decreased  participation, SLP ceased po trials.  At this time, due to pt's mentation, do not recommend PO intake.  Given h/o "choking and regurgitation" per chart review from OV, and mom's report of dysphagia x1 year and progressive x2 months, suspect esophageal component to dysphagia.  Pt's mom states food/drink gets stuck and then causes pt to choke and cough.  Mom states she will put her finger down the pt's throat to cause her to clear.  Mom points to chest to indicate area of lodging but given pt is nonverbal  - uncertain to location.  Will follow up for po readiness and recommendations for further swallowing examinations - ? esophagram and/or MBS.  Educated pt's mother to concerns, risk of aspiration and recommendations - to which she verbalized understanding. Will follow up next date. SLP Visit Diagnosis: Dysphagia, unspecified (R13.10)    Aspiration Risk  Severe aspiration risk;Risk for inadequate nutrition/hydration    Diet Recommendation NPO   Medication Administration: Via alternative means    Other  Recommendations Oral Care Recommendations: Oral care QID Other Recommendations: Have oral suction available   Follow up Recommendations  (TBD)      Frequency and Duration min 1 x/week  1 week       Prognosis Prognosis for Safe Diet Advancement: Fair Barriers to Reach Goals: Cognitive deficits;Time post onset      Swallow Study   General Date of Onset:  02/19/20 HPI: 35 yo female with h/o MR, nonverbal, DM, blind in left eye, - per mother pt can hear but does not speak- admitted after five days of fever and N/V in setting of COVID 19 and was hypotensive with AKI upon admit.  CXR clear.  Pt is on medications for COVID and IV fluids.  Swallow eval ordered.  Per mom *with use of translator" pt has had dysphagia for months - causing her to choke and cough - She states pt does not spit out the food or drink however.  She has not required the heimlich manuever.  Pt CXR negative but pt coughing with  po.  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 Interpreter May #[id number] used during session on Ipad. Type of Study: Bedside Swallow Evaluation Previous Swallow Assessment: none in system Diet Prior to this Study: NPO Temperature Spikes Noted: No Respiratory Status: Nasal cannula (2) History of Recent Intubation: No Behavior/Cognition: Lethargic/Drowsy;Doesn't follow directions Oral Cavity Assessment: Other (comment) (pt did not open mouth adequately to view, mom tried to open mouth to no avail.) Oral Care Completed by SLP: No Oral Cavity - Dentition: Other (Comment) (some dentition viewed) Vision: Impaired for self-feeding Self-Feeding Abilities: Total assist Patient Positioning: Upright in bed Baseline Vocal Quality: Other (comment);Not observed Volitional Cough: Weak (weak cough noted after swallow x1 of tsp nectar liquid) Volitional Swallow: Unable to elicit    Oral/Motor/Sensory Function Overall Oral Motor/Sensory Function: Other (comment) (pt did not follow commands, no facial asymmetry and pt able to seal lips tightly closed when mom attempted to open her mouth)   Ice Chips Ice chips: Not tested   Thin Liquid Thin Liquid: Not tested    Nectar Thick Nectar Thick Liquid: Impaired Presentation: Spoon Oral phase functional implications:  (clinically appears with delayed swallowing) Pharyngeal Phase Impairments: Suspected delayed Swallow;Cough - Delayed   Honey Thick Honey Thick Liquid: Not tested   Puree Puree: Not tested   Solid     Solid: Not tested      Chales Abrahams 02/19/2020,6:07 PM  Rolena Infante, MS [facility] SLP Acute Rehab Services Office [phone] Pager [phone]

## 2020-02-19 NOTE — ED Notes (Signed)
Pt family speaks arabic

## 2020-02-20 ENCOUNTER — Encounter (HOSPITAL_COMMUNITY): Payer: Self-pay | Admitting: Family Medicine

## 2020-02-20 ENCOUNTER — Ambulatory Visit: Payer: Self-pay | Admitting: Internal Medicine

## 2020-02-20 LAB — COMPREHENSIVE METABOLIC PANEL
ALT: 19 U/L (ref 0–44)
AST: 23 U/L (ref 15–41)
Albumin: 3.1 g/dL — ABNORMAL LOW (ref 3.5–5.0)
Alkaline Phosphatase: 26 U/L — ABNORMAL LOW (ref 38–126)
Anion gap: 15 (ref 5–15)
BUN: 15 mg/dL (ref 6–20)
CO2: 20 mmol/L — ABNORMAL LOW (ref 22–32)
Calcium: 8 mg/dL — ABNORMAL LOW (ref 8.9–10.3)
Chloride: 106 mmol/L (ref 98–111)
Creatinine, Ser: 0.68 mg/dL (ref 0.44–1.00)
GFR, Estimated: >60 mL/min (ref >60)
Glucose, Bld: 140 mg/dL — ABNORMAL HIGH (ref 70–99)
Potassium: 4.5 mmol/L (ref 3.5–5.1)
Sodium: 141 mmol/L (ref 135–145)
Total Bilirubin: 0.3 mg/dL (ref 0.3–1.2)
Total Protein: 6.3 g/dL — ABNORMAL LOW (ref 6.5–8.1)

## 2020-02-20 LAB — CBC WITH DIFFERENTIAL/PLATELET
Abs Immature Granulocytes: 0.01 10*3/uL (ref 0.00–0.07)
Basophils Absolute: 0 10*3/uL (ref 0.0–0.1)
Basophils Relative: 0 %
Eosinophils Absolute: 0 10*3/uL (ref 0.0–0.5)
Eosinophils Relative: 0 %
HCT: 31 % — ABNORMAL LOW (ref 36.0–46.0)
Hemoglobin: 10.1 g/dL — ABNORMAL LOW (ref 12.0–15.0)
Immature Granulocytes: 0 %
Lymphocytes Relative: 41 %
Lymphs Abs: 1 10*3/uL (ref 0.7–4.0)
MCH: 30.7 pg (ref 26.0–34.0)
MCHC: 32.6 g/dL (ref 30.0–36.0)
MCV: 94.2 fL (ref 80.0–100.0)
Monocytes Absolute: 0.1 10*3/uL (ref 0.1–1.0)
Monocytes Relative: 3 %
Neutro Abs: 1.4 10*3/uL — ABNORMAL LOW (ref 1.7–7.7)
Neutrophils Relative %: 56 %
Platelets: DECREASED 10*3/uL (ref 150–400)
RBC: 3.29 MIL/uL — ABNORMAL LOW (ref 3.87–5.11)
RDW: 12.8 % (ref 11.5–15.5)
WBC: 2.5 10*3/uL — ABNORMAL LOW (ref 4.0–10.5)
nRBC: 0 % (ref 0.0–0.2)

## 2020-02-20 LAB — GLUCOSE, CAPILLARY
Glucose-Capillary: 142 mg/dL — ABNORMAL HIGH (ref 70–99)
Glucose-Capillary: 114 mg/dL — ABNORMAL HIGH (ref 70–99)
Glucose-Capillary: 142 mg/dL — ABNORMAL HIGH (ref 70–99)
Glucose-Capillary: 128 mg/dL — ABNORMAL HIGH (ref 70–99)

## 2020-02-20 LAB — MAGNESIUM: Magnesium: 1.7 mg/dL (ref 1.7–2.4)

## 2020-02-20 LAB — LEGIONELLA PNEUMOPHILA SEROGP 1 UR AG: L. pneumophila Serogp 1 Ur Ag: NEGATIVE

## 2020-02-20 LAB — FERRITIN: Ferritin: 245 ng/mL (ref 11–307)

## 2020-02-20 LAB — C-REACTIVE PROTEIN: CRP: 4.3 mg/dL — ABNORMAL HIGH (ref <1.0)

## 2020-02-20 LAB — D-DIMER, QUANTITATIVE: D-Dimer, Quant: 0.44 ug/mL-FEU (ref 0.00–0.50)

## 2020-02-20 NOTE — Progress Notes (Signed)
PROGRESS NOTE    Makayla Roberts  BPZ:[id number] DOB: 10-Apr-1985 DOA: 02/18/2020 PCP: Gildardo Pounds, NP   Brief Narrative:  This is a 35 year old female with past medical history of developmental delay, left eye blindness, nonverbal, HFpEF, diabetes who presented to [facility] with a fever x5 days.  Fever has been up to 103 F at home and associated with chills and shortness of breath, diarrhea, decreased appetite and vomiting.  Mother states that the patient has been having difficulty eating for at least 2 years but more acutely over the past week to 2 weeks patient has had worsening episodes of choking with p.o. intake. Found to be COVID-19 positive on home test without any known sick contacts or recent travel.  In the ED patient noted to be febrile, tachycardic and tachypneic meeting sepsis criteria with hypotension resolving with IV fluids, notably hypoxic in the 80s on room air at rest.  Assessment & Plan:   Principal Problem:   Acute respiratory disease due to COVID-19 virus Active Problems:   Developmental disability   Type 2 diabetes mellitus without complication (HCC)   Hypotension due to hypovolemia   Acute respiratory failure with hypoxia (HCC)   Severe sepsis (HCC)   Aspiration pneumonia (HCC)   AKI (acute kidney injury) ([place])   Severe sepsis without septic shock    Acute hypoxic respiratory failure secondary to likely acute on chronic aspiration pneumonia Likely concurrent incidental COVID infection - Sepsis criteria met at admission: Febrile, tachycardic, tachypneic, leukopenic - Patient has worsening acute on chronic aspiration per mother at bedside  -likely in the setting of comorbid conditions mental status baseline -have asked speech to evaluate and educate the mother on ways to feed her daughter more safely. - Procalcitonin elevated above 1 continue ceftriaxone and azithromycin, chest x-ray personally reviewed with questionable right middle lobe opacifications and air  bronchograms concerning for aspiration - Less likely acute COVID-19 pneumonia given mild hypoxia, minimally elevated inflammatory markers with other clear etiology for acute hypoxia -we will continue Remdesivir and steroids for 5 and 10 days respectively, low threshold to discontinue Remdesivir early.  Patient does not meet criteria for baricitinib given likely active ongoing bacterial pneumonia. SpO2: 99 % O2 Flow Rate (L/min): 2 L/min  Acute transient hypotension in the setting of sepsis, hypovolemia and poor p.o. intake  - Initial blood pressure systolic in the 24M with MAP of 40, resolved quickly with IV fluids - Continue to follow clinically, blood pressure markedly well controlled this morning continue IV fluids well poor p.o. intake ongoing especially while n.p.o. in the setting of aspiration  AKI likely multifactorial: Lisinopril, hypotension, sepsis, hypovolemia.  Improving - Creatinine 2.13 at presentation, baseline 0.7 - Hold lisinopril, especially in the setting of transient hypotension - Resolving with IV fluids, creatinine now within normal limits  Hypertension, essential -currently hypotensive/normotensive -Continue to hold lisinopril given AKI and borderline hypotension  Diabetes, type II, non-insulin-dependent, well controlled - A1c 6.2 - Hold home Metformin - Start insulin per COVID-19 steroid order set, hypoglycemic protocol  History of developmental delay - Nonverbal at baseline - Continue home Risperdal once able to take p.o. safely  DVT prophylaxis: Lovenox Code Status: Full Family Communication: Mother at bedside, Fish farm manager available through [place] system  Status is: Inpatient  Dispo: The patient is from: Home              Anticipated d/c is to: Home              Anticipated d/c date  is: 48-72 hours              Patient currently not medically stable for discharge given above with acute hypoxia, bacterial pneumonia, aspiration and need for  ongoing IV antibiotics, IV antivirals and close monitoring  Consultants:   None  Procedures:   None  Antimicrobials:  Remdesivir, azithromycin, ceftriaxone: 02/19/20 -ongoing  Subjective: Review of systems limited per patient, mother at bedside indicates no acute issues or events overnight patient appears to be improving and clinically appears "well" and much more comfortable per mother.  Patient remains somewhat poorly interactive per mother and has not taken any p.o. past few days which is concerning her  Objective: Vitals:   02/20/20 0400 02/20/20 0500 02/20/20 0600 02/20/20 0700  BP: (!) 123/91 (!) 129/92 121/86 (!) 122/93  Pulse: 92 92 86 88  Resp: $Remo'17 19 19 'JmvZd$ (!) 21  Temp:      TempSrc:      SpO2: 97% 97% 97% 99%  Weight:      Height:        Intake/Output Summary (Last 24 hours) at 02/20/2020 0724 Last data filed at 02/20/2020 0654 Gross per 24 hour  Intake 3569.77 ml  Output 1200 ml  Net 2369.77 ml   Filed Weights   02/17/20 1949 02/19/20 1001  Weight: 63.1 kg 56.6 kg    Examination:  General:  Pleasantly resting in bed, No acute distress.  Poorly interactive HEENT:  Normocephalic atraumatic.  Sclerae nonicteric, noninjected.  Extraocular movements intact bilaterally. Neck:  Without mass or deformity.  Trachea is midline. Lungs:  Clear to auscultate bilaterally without rhonchi, wheeze, or rales. Heart:  Regular rate and rhythm.  Without murmurs, rubs, or gallops. Abdomen:  Soft, nontender, nondistended.  Without guarding or rebound. Extremities: Without cyanosis, clubbing, edema, or obvious deformity. Vascular:  Dorsalis pedis and posterior tibial pulses palpable bilaterally. Skin:  Warm and dry, no erythema, no ulcerations.  Data Reviewed: I have personally reviewed following labs and imaging studies  CBC: Recent Labs  Lab 02/18/20 0256 02/19/20 1018 02/20/20 0259  WBC 3.5* 3.3* 2.5*  NEUTROABS 1.9 2.2 1.4*  HGB 10.1* 10.4* 10.1*  HCT 30.8* 31.6* 31.0*   MCV 94.2 93.5 94.2  PLT 166 175 PLATELET CLUMPS NOTED ON SMEAR, COUNT APPEARS DECREASED   Basic Metabolic Panel: Recent Labs  Lab 02/18/20 0256 02/18/20 1726 02/19/20 1018 02/20/20 0259  NA 135 139 140 141  K 4.9 4.4 4.0 4.5  CL 103 108 106 106  CO2 $Re'22 23 22 'tAJ$ 20*  GLUCOSE 229* 123* 150* 140*  BUN 29* $Remov'17 11 15  'jUtRsE$ CREATININE 2.13* 1.10* 0.72 0.68  CALCIUM 8.4* 7.8* 8.1* 8.0*  MG  --   --   --  1.7   GFR: Estimated Creatinine Clearance: 72.4 mL/min (by C-G formula based on SCr of 0.68 mg/dL). Liver Function Tests: Recent Labs  Lab 02/18/20 0256 02/19/20 1018 02/20/20 0259  AST $Re'24 23 23  'rxb$ ALT $R'24 20 19  'Wc$ ALKPHOS 28* 24* 26*  BILITOT 0.4 0.5 0.3  PROT 6.9 6.1* 6.3*  ALBUMIN 3.8 3.0* 3.1*   No results for input(s): LIPASE, AMYLASE in the last 168 hours. No results for input(s): AMMONIA in the last 168 hours. Coagulation Profile: Recent Labs  Lab 02/18/20 0256  INR 1.0   Cardiac Enzymes: No results for input(s): CKTOTAL, CKMB, CKMBINDEX, TROPONINI in the last 168 hours. BNP (last 3 results) No results for input(s): PROBNP in the last 8760 hours. HbA1C: No results for input(s): HGBA1C in  the last 72 hours. CBG: Recent Labs  Lab 02/19/20 0759 02/19/20 1215 02/19/20 1522 02/19/20 1654 02/19/20 2155  GLUCAP 130* 123* 138* 132* 120*   Lipid Profile: No results for input(s): CHOL, HDL, LDLCALC, TRIG, CHOLHDL, LDLDIRECT in the last 72 hours. Thyroid Function Tests: No results for input(s): TSH, T4TOTAL, FREET4, T3FREE, THYROIDAB in the last 72 hours. Anemia Panel: Recent Labs    02/19/20 1018 02/20/20 0259  FERRITIN 217 245   Sepsis Labs: Recent Labs  Lab 02/18/20 0256 02/18/20 0300 02/18/20 0538 02/19/20 1018  PROCALCITON  --  6.11  --  1.08  LATICACIDVEN 1.3  --  1.8  --     Recent Results (from the past 240 hour(s))  Resp Panel by RT-PCR (Flu A&B, Covid) Nasopharyngeal Swab     Status: Abnormal   Collection Time: 02/18/20  2:33 AM   Specimen:  Nasopharyngeal Swab; Nasopharyngeal(NP) swabs in vial transport medium  Result Value Ref Range Status   SARS Coronavirus 2 by RT PCR POSITIVE (A) NEGATIVE Final    Comment: RESULT CALLED TO, READ BACK BY AND VERIFIED WITH: POWELL,V AT 0411 ON [id number] BY CHERESNOWSKY,T (NOTE) SARS-CoV-2 target nucleic acids are DETECTED.  The SARS-CoV-2 RNA is generally detectable in upper respiratory specimens during the acute phase of infection. Positive results are indicative of the presence of the identified virus, but do not rule out bacterial infection or co-infection with other pathogens not detected by the test. Clinical correlation with patient history and other diagnostic information is necessary to determine patient infection status. The expected result is Negative.  Fact Sheet for Patients: EntrepreneurPulse.com.au  Fact Sheet for Healthcare Providers: IncredibleEmployment.be  This test is not yet approved or cleared by the [place] FDA and  has been authorized for detection and/or diagnosis of SARS-CoV-2 by FDA under an Emergency Use Authorization (EUA).  This EUA will remain in effect (meaning this  test can be used) for the duration of  the COVID-19 declaration under Section 564(b)(1) of the Act, 21 U.S.C. section 360bbb-3(b)(1), unless the authorization is terminated or revoked sooner.     Influenza A by PCR NEGATIVE NEGATIVE Final   Influenza B by PCR NEGATIVE NEGATIVE Final    Comment: (NOTE) The Xpert Xpress SARS-CoV-2/FLU/RSV plus assay is intended as an aid in the diagnosis of influenza from Nasopharyngeal swab specimens and should not be used as a sole basis for treatment. Nasal washings and aspirates are unacceptable for Xpert Xpress SARS-CoV-2/FLU/RSV testing.  Fact Sheet for Patients: EntrepreneurPulse.com.au  Fact Sheet for Healthcare Providers: IncredibleEmployment.be  This test is not yet  approved or cleared by the [place] FDA and has been authorized for detection and/or diagnosis of SARS-CoV-2 by FDA under an Emergency Use Authorization (EUA). This EUA will remain in effect (meaning this test can be used) for the duration of the COVID-19 declaration under Section 564(b)(1) of the Act, 21 U.S.C. section 360bbb-3(b)(1), unless the authorization is terminated or revoked.  Performed at [facility], [address] [place], [place] [address]   Urine culture     Status: None   Collection Time: 02/18/20  2:56 AM   Specimen: In/Out Cath Urine  Result Value Ref Range Status   Specimen Description   Final    IN/OUT CATH URINE Performed at [facility], [address] [place], [place] [address]    Special Requests   Final    NONE Performed at [facility], [address] [place], [place] [address]    Culture   Final    NO GROWTH Performed at [facility], [place] [address] [place], [place] [address]    Report Status 02/19/2020 FINAL  Final  Blood Culture (routine x 2)     Status: None (Preliminary result)   Collection Time: 02/18/20  2:58 AM   Specimen: BLOOD RIGHT FOREARM  Result Value Ref Range Status   Specimen Description   Final    BLOOD RIGHT FOREARM Performed at [facility], [address] [place], [place] [address]    Special Requests   Final    BOTTLES DRAWN AEROBIC AND ANAEROBIC Blood Culture adequate volume Performed at [facility], [address], [place], [place] [address]    Culture   Final    NO GROWTH 1 DAY Performed at [facility], [place] [address] [place], [place] [address]    Report Status PENDING  Incomplete  Blood Culture (routine x 2)     Status: None (Preliminary result)   Collection Time: 02/18/20  2:58 AM   Specimen: BLOOD LEFT ARM  Result Value Ref Range Status   Specimen Description   Final    BLOOD LEFT ARM Performed at [facility], [address] [place], [place] [address]    Special Requests   Final    BOTTLES DRAWN AEROBIC AND ANAEROBIC Blood Culture adequate volume Performed at [facility], [address], [place], [place] [address]    Culture   Final    NO GROWTH 1 DAY Performed at [facility], [place] [address], [place], [place] [address]    Report Status PENDING  Incomplete  MRSA PCR Screening     Status: None   Collection Time: 02/19/20  9:29 AM   Specimen: Nasal Mucosa; Nasopharyngeal  Result Value Ref Range Status   MRSA by PCR NEGATIVE NEGATIVE Final    Comment:        The GeneXpert MRSA Assay (FDA approved for NASAL specimens only), is one component of a comprehensive MRSA colonization surveillance program. It is not intended to diagnose MRSA infection nor to guide or monitor treatment for MRSA infections. Performed at [facility], [place] [address] [place], [place] [address]          Radiology Studies: No results found.      Scheduled Meds: . Chlorhexidine Gluconate Cloth  6 each Topical Daily  . enoxaparin (LOVENOX) injection  40 mg Subcutaneous Daily  . insulin aspart  0-15 Units Subcutaneous TID WC  . insulin detemir  0.1 Units/kg Subcutaneous BID  . mouth rinse  15 mL Mouth Rinse BID  . methylPREDNISolone (SOLU-MEDROL) injection  30 mg Intravenous BID   Followed by  . [START ON 02/22/2020] predniSONE  50 mg Oral Daily  . risperiDONE  1.5 mg Oral BID  . sodium chloride flush  3 mL Intravenous Q12H   Continuous Infusions: . azithromycin Stopped (02/19/20 2136)  . cefTRIAXone (ROCEPHIN)  IV Stopped (02/19/20 2258)  . lactated ringers 75 mL/hr at 02/20/20 0654  . remdesivir 100 mg in NS 100 mL       LOS: 1 day   Time spent: 2min  Jayzon Taras C Muriel Hannold, DO [facility]  If 7PM-7AM, please contact night-coverage [email or web address]  02/20/2020, 7:24 AM

## 2020-02-20 NOTE — Progress Notes (Deleted)
Cardiology Office Note:    Date:  02/20/2020   ID:  Makayla Roberts, DOB 07/17/1985, MRN [id number]  PCP:  Claiborne Rigg, NP  [facility] Cardiologist:  No primary care provider on file.  [facility] Electrophysiologist:  None   CC: *** Consulted for the evaluation of *** at the behest of Claiborne Rigg, NP  History of Present Illness:    Makayla Roberts is a 35 y.o. female with a hx of DM with HTN, SVT NOS; Developmental Delay, Blindness and Deafness who presents for evaluation.  Patient notes that (s)he is feeling ***.  Has had no chest pain, chest pressure, chest tightness, chest stinging ***.  Discomfort occurs with ***, worsens with ***, and improves with ***.  Patient exertion notable for *** with *** and feels no symptoms.  No shortness of breath, DOE ***.  No PND or orthopnea***.  No bendopnea***, weight gain***, leg swelling ***, or abdominal swelling***.  No syncope or near syncope ***.  Notes *** no palpitations or funny heart beats.     Ambulatory BP ***.  History From: ***  Past Medical History:  Diagnosis Date  . Blind left eye   . Deaf   . Developmental delay   . Diabetes mellitus without complication (HCC)   . Mental retardation     Past Surgical History:  Procedure Laterality Date  . None      Current Medications: No outpatient medications have been marked as taking for the 02/20/20 encounter (Appointment) with Christell Constant, MD.     Allergies:   Morphine and related   Social History   Socioeconomic History  . Marital status: Single    Spouse name: Not on file  . Number of children: Not on file  . Years of education: Not on file  . Highest education level: Not on file  Occupational History  . Not on file  Tobacco Use  . Smoking status: Never Smoker  . Smokeless tobacco: Never Used  Vaping Use  . Vaping Use: Never used  Substance and Sexual Activity  . Alcohol use: No  . Drug use: No  . Sexual activity: Never  Other Topics  Concern  . Not on file  Social History Narrative   ** Merged History Encounter **       Patient lives at home with her mom (Zenind). Education- None Left handed. Caffeine- Some hot tea  Patient Special Coordinator Evon Slack - [phone] -cell, [phone] and fax [phone].   Social Determinants of Health   Financial Resource Strain: Not on file  Food Insecurity: Not on file  Transportation Needs: Not on file  Physical Activity: Not on file  Stress: Not on file  Social Connections: Not on file     Family History: The patient's family history includes Arthritis in her mother; Diabetes in her father; Heart disease in her mother; Hypertension in her father and mother.  ROS:   Please see the history of present illness.     All other systems reviewed and are negative.  EKGs/Labs/Other Studies Reviewed:    The following studies were reviewed today:  EKG:   02/18/2020: Sinus Tachycardia rate 106 iRBBB  Transthoracic Echocardiogram: Date: 01/01/2015 Results: Study Conclusions  - Left ventricle: The cavity size was normal. Wall thickness was  normal. Systolic function was normal. The estimated ejection  fraction was in the range of 55% to 60%. Wall motion was normal;  there were no regional wall motion abnormalities. Doppler  parameters are consistent with abnormal left ventricular  relaxation (grade 1 diastolic dysfunction).   NonCardiac CT: Date: 12/18/2014 Results: Tortuous aorta without aortic calcification or CAC  Recent Labs: 02/20/2020: ALT 19; BUN 15; Creatinine, Ser 0.68; Hemoglobin 10.1; Magnesium 1.7; Platelets PLATELET CLUMPS NOTED ON SMEAR, COUNT APPEARS DECREASED; Potassium 4.5; Sodium 141  Recent Lipid Panel    Component Value Date/Time   CHOL 106 10/06/2019 1234   TRIG 136 10/06/2019 1234   HDL 49 10/06/2019 1234   CHOLHDL 2.2 10/06/2019 1234   LDLCALC 34 10/06/2019 1234     Risk Assessment/Calculations:   {Does this patient have  ATRIAL FIBRILLATION?:[id number]}   Physical Exam:    VS:  There were no vitals taken for this visit.    Wt Readings from Last 3 Encounters:  02/19/20 124 lb 12.5 oz (56.6 kg)  01/08/20 139 lb 3.2 oz (63.1 kg)  10/26/19 144 lb (65.3 kg)     GEN: *** Well nourished, well developed in no acute distress HEENT: Normal NECK: No JVD; No carotid bruits LYMPHATICS: No lymphadenopathy CARDIAC: ***RRR, no murmurs, rubs, gallops RESPIRATORY:  Clear to auscultation without rales, wheezing or rhonchi  ABDOMEN: Soft, non-tender, non-distended MUSCULOSKELETAL:  No edema; No deformity  SKIN: Warm and dry NEUROLOGIC:  Alert and oriented x 3 PSYCHIATRIC:  Normal affect   ASSESSMENT:    No diagnosis found. PLAN:    In order of problems listed above:  Query of inappropriate sinus tachycardia - will obtain ***-day live/non live heart monitor (ZioPatch/Pre[employer]ice) - AV Nodal Therapy: *** - AAD: - EP evaluation:      {Are you ordering a CV Procedure (e.g. stress test, cath, DCCV, TEE, etc)?   Press F2        :[id number]}    Medication Adjustments/Labs and Tests Ordered: Current medicines are reviewed at length with the patient today.  Concerns regarding medicines are outlined above.  No orders of the defined types were placed in this encounter.  No orders of the defined types were placed in this encounter.   There are no Patient Instructions on file for this visit.   Signed, Christell Constant, MD  02/20/2020 12:11 PM    [facility]

## 2020-02-20 NOTE — Progress Notes (Signed)
Patient arrives to room 1540 at this time

## 2020-02-20 NOTE — Progress Notes (Signed)
Speech Language Pathology Treatment: Dysphagia  Patient Details Name: Margia Wiesen MRN: [id number] DOB: 1985/08/23 Today's Date: 02/20/2020 Time: 7829-5621 SLP Time Calculation (min) (ACUTE ONLY): 45 min  Assessment / Plan / Recommendation Clinical Impression  Mom reports pt is fluent in comprehension of [place] and mother understands some English, thus SLP requested interpreter interpret everything heard in the room on behalf of the mom.  Pt is nonverbal.  Pt is sleepy, mom reports she has been up since 1300.   Secretions are retained in oral cavity with pt not swallowing nor following directions to allow oral suction.  Pt accepted intake of tsp water and apple juice as well as straw sip of apple juice.  Significantly delayed swallow noted with post=swallow congested cough.  Pt did not maintain alertness consistently during session.  Mother today reports pt was not masticating foods at home and would swallow whole causing coughing/regurgitation - even occuring with liquids. Pt has an obvious oral dysphagia but suspect pharyngeal involvement as well.  Mom admits she "forced" her daughter to drink water and but pt would consume cappucinos readily.  Mom also states pt has not consumed anything for the last four days.  Speech goal is for pt's mentation to improve to allow pt to undergo MBS - potentially pt may benefit from GI work up as well.  However due to pt's AMS, SLP would recommend to continue NPO.  Given progressive nature of dysphagia over the last several months, acute on chronic dysphagia may require longer to improve.    Advised pt's mother and RN to concerns.  Pt's mother reports she is not feeling well and SLP encouraged her to get tested for COVID and seek treatment.  Pt's mother does not want to leave her daughter as she states she is her only care provider and pt can not communicate her needs.  Relayed information to RN.  Will follow up next date for readiness for instrumental evaluation.     HPI HPI: 35 yo female with h/o MR, nonverbal, DM, blind in left eye, - per mother pt can hear but does not speak- admitted after five days of fever and N/V in setting of COVID 19 and was hypotensive with AKI upon admit.  CXR clear.  Pt is on medications for COVID and IV fluids.  Swallow eval ordered.  Per mom *with use of translator" pt has had dysphagia for months - causing her to choke and cough - She states pt does not spit out the food or drink however.  She has not required the heimlich manuever.  Pt CXR negative but pt coughing with po.  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 Interpreter May #[id number] used during initial evaluation.  On 02/20/2020,interpreter Earma Reading #[id number] or [id number] used.      SLP Plan  Continue with current plan of care;MBS (mbs when pt can participate)       Recommendations  Diet recommendations: NPO Medication Administration: Via alternative means                Oral Care Recommendations: Oral care QID Follow up Recommendations:  (TBD) SLP Visit Diagnosis: Dysphagia, unspecified (R13.10);Dysphagia, oral phase (R13.11) Plan: Continue with current plan of care;MBS (mbs when pt can participate)       GO                Chales Abrahams 02/20/2020, 1:31 PM  Rolena Infante, MS [facility] SLP Acute Rehab Services Office [phone] Pager [phone]

## 2020-02-21 LAB — CBC WITH DIFFERENTIAL/PLATELET
Abs Immature Granulocytes: 0.02 10*3/uL (ref 0.00–0.07)
Basophils Absolute: 0 10*3/uL (ref 0.0–0.1)
Basophils Relative: 0 %
Eosinophils Absolute: 0 10*3/uL (ref 0.0–0.5)
Eosinophils Relative: 0 %
HCT: 35.5 % — ABNORMAL LOW (ref 36.0–46.0)
Hemoglobin: 11.1 g/dL — ABNORMAL LOW (ref 12.0–15.0)
Immature Granulocytes: 1 %
Lymphocytes Relative: 24 %
Lymphs Abs: 1.1 10*3/uL (ref 0.7–4.0)
MCH: 30.2 pg (ref 26.0–34.0)
MCHC: 31.3 g/dL (ref 30.0–36.0)
MCV: 96.7 fL (ref 80.0–100.0)
Monocytes Absolute: 0.2 10*3/uL (ref 0.1–1.0)
Monocytes Relative: 4 %
Neutro Abs: 3.1 10*3/uL (ref 1.7–7.7)
Neutrophils Relative %: 71 %
Platelets: 257 10*3/uL (ref 150–400)
RBC: 3.67 MIL/uL — ABNORMAL LOW (ref 3.87–5.11)
RDW: 13 % (ref 11.5–15.5)
WBC: 4.4 10*3/uL (ref 4.0–10.5)
nRBC: 0 % (ref 0.0–0.2)

## 2020-02-21 LAB — COMPREHENSIVE METABOLIC PANEL
ALT: 23 U/L (ref 0–44)
AST: 28 U/L (ref 15–41)
Albumin: 3.1 g/dL — ABNORMAL LOW (ref 3.5–5.0)
Alkaline Phosphatase: 28 U/L — ABNORMAL LOW (ref 38–126)
Anion gap: 14 (ref 5–15)
BUN: 23 mg/dL — ABNORMAL HIGH (ref 6–20)
CO2: 20 mmol/L — ABNORMAL LOW (ref 22–32)
Calcium: 8.1 mg/dL — ABNORMAL LOW (ref 8.9–10.3)
Chloride: 110 mmol/L (ref 98–111)
Creatinine, Ser: 0.83 mg/dL (ref 0.44–1.00)
GFR, Estimated: >60 mL/min (ref >60)
Glucose, Bld: 158 mg/dL — ABNORMAL HIGH (ref 70–99)
Potassium: 4.1 mmol/L (ref 3.5–5.1)
Sodium: 144 mmol/L (ref 135–145)
Total Bilirubin: 0.5 mg/dL (ref 0.3–1.2)
Total Protein: 6.2 g/dL — ABNORMAL LOW (ref 6.5–8.1)

## 2020-02-21 LAB — FERRITIN: Ferritin: 218 ng/mL (ref 11–307)

## 2020-02-21 LAB — GLUCOSE, CAPILLARY
Glucose-Capillary: 158 mg/dL — ABNORMAL HIGH (ref 70–99)
Glucose-Capillary: 155 mg/dL — ABNORMAL HIGH (ref 70–99)
Glucose-Capillary: 181 mg/dL — ABNORMAL HIGH (ref 70–99)
Glucose-Capillary: 182 mg/dL — ABNORMAL HIGH (ref 70–99)

## 2020-02-21 LAB — D-DIMER, QUANTITATIVE: D-Dimer, Quant: 0.44 ug/mL-FEU (ref 0.00–0.50)

## 2020-02-21 LAB — C-REACTIVE PROTEIN: CRP: 1.9 mg/dL — ABNORMAL HIGH (ref <1.0)

## 2020-02-21 LAB — MAGNESIUM: Magnesium: 2 mg/dL (ref 1.7–2.4)

## 2020-02-21 NOTE — Progress Notes (Signed)
  Speech Language Pathology Treatment: Dysphagia  Patient Details Name: Makayla Roberts MRN: [id number] DOB: 04-19-1985 Today's Date: 02/21/2020 Time: 1400-1500 SLP Time Calculation (min) (ACUTE ONLY): 60 min  Assessment / Plan / Recommendation Clinical Impression  Pt was seen at bedside for skilled ST intervention targeting goals for PO readiness. Pt's mother was present during this session. Pt was awake and alert, seated upright in bed. Suction was set up to facilitate oral care. Mother assisted SLP with oral care, as pt tightened her lips and would not allow SLP to clean the inside of her mouth.   Pt accepted trials of thin liquid, nectar thick liquid, and puree textures with mother's assistance/encouragement. Immediate cough response noted after the swallow of thin liquids. Nectar thick liquids and puree did not elicit a cough response, however, extended oral holding is suspected and minimal bolus manipulation was observed. Solid texture not attempted at this time for this reason.   Will begin conservative diet of dys 1 solids and nectar thick liquids, meds crushed in puree. Safe swallow precautions left to be posted at [facility]. SLP will follow to assess diet tolerance and determine readiness to advance and/or complete instrumental study.   HPI HPI: 35 yo female with h/o MR, nonverbal, DM, blind in left eye, - per mother pt can hear but does not speak- admitted after five days of fever and N/V in setting of COVID 19 and was hypotensive with AKI upon admit.  CXR clear.  Pt is on medications for COVID and IV fluids.  Swallow eval ordered.  Per mom *with use of translator" pt has had dysphagia for months - causing her to choke and cough - She states pt does not spit out the food or drink however.  She has not required the heimlich manuever.  Pt CXR negative but pt coughing with po.  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      SLP Plan  Continue with current plan of care        Recommendations  Diet recommendations: Dysphagia 1 (puree);Nectar-thick liquid Liquids provided via: Straw;Teaspoon Medication Administration: Crushed with puree Supervision: Full supervision/cueing for compensatory strategies;Trained caregiver to feed patient Compensations: Minimize environmental distractions;Slow rate;Small sips/bites;Other (Comment) (check oral cavity for clearing) Postural Changes and/or Swallow Maneuvers: Seated upright 90 degrees                Oral Care Recommendations: Oral care QID Follow up Recommendations: 24 hour supervision/assistance SLP Visit Diagnosis: Dysphagia, unspecified (R13.10);Dysphagia, oral phase (R13.11) Plan: Continue with current plan of care       GO               Celia B. Murvin Natal, [facility], CCC-SLP Speech Language Pathologist Office: [phone] Pager: [phone]  Leigh Aurora 02/21/2020, 3:18 PM

## 2020-02-21 NOTE — Progress Notes (Signed)
PROGRESS NOTE    Makayla Roberts  XBD:[id number] DOB: [birth year] DOA: 02/18/2020 PCP: Gildardo Pounds, NP   Brief Narrative:  This is a 35 year old female with past medical history of developmental delay, left eye blindness, nonverbal, HFpEF, diabetes who presented to [facility] with a fever x5 days.  Fever has been up to 103 F at home and associated with chills and shortness of breath, diarrhea, decreased appetite and vomiting.  Mother states that the patient has been having difficulty eating for at least 2 years but more acutely over the past week to 2 weeks patient has had worsening episodes of choking with p.o. intake. Found to be COVID-19 positive on home test without any known sick contacts or recent travel.  In the ED patient noted to be febrile, tachycardic and tachypneic meeting sepsis criteria with hypotension resolving with IV fluids, notably hypoxic in the 80s on room air at rest.  Assessment & Plan:   Principal Problem:   Acute respiratory disease due to COVID-19 virus Active Problems:   Developmental disability   Type 2 diabetes mellitus without complication (HCC)   Hypotension due to hypovolemia   Acute respiratory failure with hypoxia (HCC)   Severe sepsis (HCC)   Aspiration pneumonia (HCC)   AKI (acute kidney injury) ([place])   Severe sepsis without septic shock    Acute hypoxic respiratory failure secondary to likely acute on chronic aspiration pneumonia Likely concurrent incidental COVID infection - Sepsis criteria met at admission: Febrile, tachycardic, tachypneic, leukopenic - Patient has worsening acute on chronic aspiration per mother at bedside  -likely in the setting of comorbid conditions mental status baseline -have asked speech to evaluate and educate the mother on ways to feed her daughter more safely. - Procalcitonin elevated above 1 continue ceftriaxone and azithromycin, chest x-ray personally reviewed with questionable right middle lobe opacifications and air  bronchograms concerning for aspiration - Less likely acute COVID-19 pneumonia given mild hypoxia, minimally elevated inflammatory markers with other clear etiology for acute hypoxia -we will continue Remdesivir and steroids for 5 and 10 days respectively, low threshold to discontinue Remdesivir early.  Patient does not meet criteria for baricitinib given likely active ongoing bacterial pneumonia. SpO2: 97 % O2 Flow Rate (L/min): 2 L/min  02/21/20 Patient being weaned off to RA today, will continue monitoring O2 sats. Continue remdesivir, decadron, and Abx for pna.   Acute transient hypotension in the setting of sepsis, hypovolemia and poor p.o. intake  - Initial blood pressure systolic in the 83M with MAP of 40, resolved quickly with IV fluids - Continue to follow clinically, blood pressure markedly well controlled this morning continue IV fluids well poor p.o. intake ongoing especially while n.p.o. in the setting of aspiration.  02/21/20 No longer on IV fluids, will start pureed diet today, after being seen by SLP with improved mentation.   AKI likely multifactorial: Lisinopril, hypotension, sepsis, hypovolemia.  Improving - Creatinine 2.13 at presentation, baseline 0.7 - Hold lisinopril, especially in the setting of transient hypotension - Resolving with IV fluids, creatinine now within normal limits 02/21/20 Scr now at baseline, normal.   Hypertension, essential -currently hypotensive/normotensive -Continue to hold lisinopril given AKI and borderline hypotension  Diabetes, type II, non-insulin-dependent, well controlled - A1c 6.2 - Hold home Metformin - Start insulin per COVID-19 steroid order set, hypoglycemic protocol 02/21/20 Continue current regimen  History of developmental delay - Nonverbal at baseline - Continue home Risperdal   DVT prophylaxis: Lovenox Code Status: Full Family Communication: Mother at bedside, Fish farm manager  available through [facility] system. Mother updated  on today's plan.   Status is: Inpatient  Dispo: The patient is from: Home              Anticipated d/c is to: Home              Anticipated d/c date is: 48-72 hours              Patient currently not medically stable for discharge given above with acute hypoxia, bacterial pneumonia, aspiration and need for ongoing IV antibiotics, IV antivirals and close monitoring  Consultants:   None  Procedures:   None  Antimicrobials:  Remdesivir, azithromycin, ceftriaxone: 02/19/20 -ongoing  Subjective: Patient more alert and interactive today. Per her mother at the bedside, the patient has not slept much last night, wants to be more interactive.  O2 sats noted to be improving today. She is being weaned off O2 supplementation. SLP reevaluated, she will start a dysphagia diet this afternoon.    Objective: Vitals:   02/20/20 2008 02/21/20 0003 02/21/20 0323 02/21/20 1421  BP: 114/90 108/74 116/87 119/89  Pulse: 73 71 82 (!) 59  Resp: $Remo'18 18 17 16  'vKXRp$ Temp: 98.1 F (36.7 C) 98.6 F (37 C) 98.8 F (37.1 C) 98 F (36.7 C)  TempSrc: Oral   Oral  SpO2: 96% 94% 91% 97%  Weight:      Height:        Intake/Output Summary (Last 24 hours) at 02/21/2020 1747 Last data filed at 02/20/2020 2140 Gross per 24 hour  Intake 10 ml  Output -  Net 10 ml   Filed Weights   02/17/20 1949 02/19/20 1001  Weight: 63.1 kg 56.6 kg    Examination:  General:  Pleasantly resting in bed, No acute distress.  Posey gloves in place, patient trying to pick at the pulse ox finger probe.  HEENT: Atraumatic, MMM.  Lungs:  Occasional cough, normal rate Heart:  RRR, S1 and S2 present.  Abdomen:  Soft, nontender, nondistended.   Extremities: Without cyanosis, or edema.  Skin:  Warm and dry, no erythema  Data Reviewed: I have personally reviewed following labs and imaging studies  CBC: Recent Labs  Lab 02/18/20 0256 02/19/20 1018 02/20/20 0259 02/21/20 0544  WBC 3.5* 3.3* 2.5* 4.4  NEUTROABS 1.9 2.2 1.4* 3.1   HGB 10.1* 10.4* 10.1* 11.1*  HCT 30.8* 31.6* 31.0* 35.5*  MCV 94.2 93.5 94.2 96.7  PLT 166 175 PLATELET CLUMPS NOTED ON SMEAR, COUNT APPEARS DECREASED 774   Basic Metabolic Panel: Recent Labs  Lab 02/18/20 0256 02/18/20 1726 02/19/20 1018 02/20/20 0259 02/21/20 0544  NA 135 139 140 141 144  K 4.9 4.4 4.0 4.5 4.1  CL 103 108 106 106 110  CO2 $Re'22 23 22 'wyg$ 20* 20*  GLUCOSE 229* 123* 150* 140* 158*  BUN 29* $Remov'17 11 15 'dVcUnY$ 23*  CREATININE 2.13* 1.10* 0.72 0.68 0.83  CALCIUM 8.4* 7.8* 8.1* 8.0* 8.1*  MG  --   --   --  1.7 2.0   GFR: Estimated Creatinine Clearance: 69.8 mL/min (by C-G formula based on SCr of 0.83 mg/dL). Liver Function Tests: Recent Labs  Lab 02/18/20 0256 02/19/20 1018 02/20/20 0259 02/21/20 0544  AST $Re'24 23 23 28  'ohU$ ALT $R'24 20 19 23  'Fq$ ALKPHOS 28* 24* 26* 28*  BILITOT 0.4 0.5 0.3 0.5  PROT 6.9 6.1* 6.3* 6.2*  ALBUMIN 3.8 3.0* 3.1* 3.1*   No results for input(s): LIPASE, AMYLASE in the last 168 hours. No  results for input(s): AMMONIA in the last 168 hours. Coagulation Profile: Recent Labs  Lab 02/18/20 0256  INR 1.0   Cardiac Enzymes: No results for input(s): CKTOTAL, CKMB, CKMBINDEX, TROPONINI in the last 168 hours. BNP (last 3 results) No results for input(s): PROBNP in the last 8760 hours. HbA1C: No results for input(s): HGBA1C in the last 72 hours. CBG: Recent Labs  Lab 02/20/20 1749 02/20/20 2158 02/21/20 0811 02/21/20 1141 02/21/20 1727  GLUCAP 142* 128* 158* 155* 181*   Lipid Profile: No results for input(s): CHOL, HDL, LDLCALC, TRIG, CHOLHDL, LDLDIRECT in the last 72 hours. Thyroid Function Tests: No results for input(s): TSH, T4TOTAL, FREET4, T3FREE, THYROIDAB in the last 72 hours. Anemia Panel: Recent Labs    02/20/20 0259 02/21/20 0544  FERRITIN 245 218   Sepsis Labs: Recent Labs  Lab 02/18/20 0256 02/18/20 0300 02/18/20 0538 02/19/20 1018  PROCALCITON  --  6.11  --  1.08  LATICACIDVEN 1.3  --  1.8  --     Recent Results  (from the past 240 hour(s))  Resp Panel by RT-PCR (Flu A&B, Covid) Nasopharyngeal Swab     Status: Abnormal   Collection Time: 02/18/20  2:33 AM   Specimen: Nasopharyngeal Swab; Nasopharyngeal(NP) swabs in vial transport medium  Result Value Ref Range Status   SARS Coronavirus 2 by RT PCR POSITIVE (A) NEGATIVE Final    Comment: RESULT CALLED TO, READ BACK BY AND VERIFIED WITH: POWELL,V AT 0411 ON [id number] BY CHERESNOWSKY,T (NOTE) SARS-CoV-2 target nucleic acids are DETECTED.  The SARS-CoV-2 RNA is generally detectable in upper respiratory specimens during the acute phase of infection. Positive results are indicative of the presence of the identified virus, but do not rule out bacterial infection or co-infection with other pathogens not detected by the test. Clinical correlation with patient history and other diagnostic information is necessary to determine patient infection status. The expected result is Negative.  Fact Sheet for Patients: EntrepreneurPulse.com.au  Fact Sheet for Healthcare Providers: IncredibleEmployment.be  This test is not yet approved or cleared by the [place] FDA and  has been authorized for detection and/or diagnosis of SARS-CoV-2 by FDA under an Emergency Use Authorization (EUA).  This EUA will remain in effect (meaning this  test can be used) for the duration of  the COVID-19 declaration under Section 564(b)(1) of the Act, 21 U.S.C. section 360bbb-3(b)(1), unless the authorization is terminated or revoked sooner.     Influenza A by PCR NEGATIVE NEGATIVE Final   Influenza B by PCR NEGATIVE NEGATIVE Final    Comment: (NOTE) The Xpert Xpress SARS-CoV-2/FLU/RSV plus assay is intended as an aid in the diagnosis of influenza from Nasopharyngeal swab specimens and should not be used as a sole basis for treatment. Nasal washings and aspirates are unacceptable for Xpert Xpress SARS-CoV-2/FLU/RSV testing.  Fact Sheet for  Patients: EntrepreneurPulse.com.au  Fact Sheet for Healthcare Providers: IncredibleEmployment.be  This test is not yet approved or cleared by the [place] FDA and has been authorized for detection and/or diagnosis of SARS-CoV-2 by FDA under an Emergency Use Authorization (EUA). This EUA will remain in effect (meaning this test can be used) for the duration of the COVID-19 declaration under Section 564(b)(1) of the Act, 21 U.S.C. section 360bbb-3(b)(1), unless the authorization is terminated or revoked.  Performed at [facility], [address] [place], [place] [address]   Urine culture     Status: None   Collection Time: 02/18/20  2:56 AM   Specimen: In/Out  Cath Urine  Result Value Ref Range Status   Specimen Description   Final    IN/OUT CATH URINE Performed at [facility], [address] [place], [place] [address]    Special Requests   Final    NONE Performed at [facility], [address] [place], [place] [address]    Culture   Final    NO GROWTH Performed at [facility], [place] [address], [place], [place] [address]    Report Status 02/19/2020 FINAL  Final  Blood Culture (routine x 2)     Status: None (Preliminary result)   Collection Time: 02/18/20  2:58 AM   Specimen: BLOOD RIGHT FOREARM  Result Value Ref Range Status   Specimen Description   Final    BLOOD RIGHT FOREARM Performed at [facility], [address] [place], [place] [address]    Special Requests   Final    BOTTLES DRAWN AEROBIC AND ANAEROBIC Blood Culture adequate volume Performed at [facility], [address] [place], [place] [address]    Culture   Final    NO GROWTH 3 DAYS Performed at [facility], [place] [address] [place], [place] [address]    Report Status PENDING  Incomplete  Blood Culture (routine x 2)     Status: None (Preliminary result)   Collection Time: 02/18/20  2:58 AM    Specimen: BLOOD LEFT ARM  Result Value Ref Range Status   Specimen Description   Final    BLOOD LEFT ARM Performed at [facility], [address] [place], [place] [address]    Special Requests   Final    BOTTLES DRAWN AEROBIC AND ANAEROBIC Blood Culture adequate volume Performed at [facility], [address] [place], [place] [address]    Culture   Final    NO GROWTH 3 DAYS Performed at [facility], [place] [address], [place], [place] [address]    Report Status PENDING  Incomplete  MRSA PCR Screening     Status: None   Collection Time: 02/19/20  9:29 AM   Specimen: Nasal Mucosa; Nasopharyngeal  Result Value Ref Range Status   MRSA by PCR NEGATIVE NEGATIVE Final    Comment:        The GeneXpert MRSA Assay (FDA approved for NASAL specimens only), is one component of a comprehensive MRSA colonization surveillance program. It is not intended to diagnose MRSA infection nor to guide or monitor treatment for MRSA infections. Performed at [facility], [place] [address], [place], [place] [address]   Aerobic Culture (superficial specimen)     Status: None (Preliminary result)   Collection Time: 02/19/20  5:04 PM   Specimen: Vaginal  Result Value Ref Range Status   Specimen Description   Final    VAGINA Performed at [facility] [address] [place], [place] [address]    Special Requests   Final    NONE Performed at [facility], [place] [address] [place], [place] [address]    Gram Stain NO WBC SEEN FEW GRAM POSITIVE RODS   Final   Culture   Final    RARE NORMAL VAGINAL FLORA NO GROUP B STREP (S.AGALACTIAE) ISOLATED NO STAPHYLOCOCCUS AUREUS ISOLATED Performed at [facility], [address] [place], [place] [address]    Report Status PENDING  Incomplete  Radiology Studies: No results found.      Scheduled Meds: . Chlorhexidine Gluconate Cloth  6 each  Topical Daily  . enoxaparin (LOVENOX) injection  40 mg Subcutaneous Daily  . insulin aspart  0-15 Units Subcutaneous TID WC  . insulin detemir  0.1 Units/kg Subcutaneous BID  . mouth rinse  15 mL Mouth Rinse BID  . methylPREDNISolone (SOLU-MEDROL) injection  30 mg Intravenous BID   Followed by  . [START ON 02/22/2020] predniSONE  50 mg Oral Daily  . risperiDONE  1.5 mg Oral BID  . sodium chloride flush  3 mL Intravenous Q12H   Continuous Infusions: . azithromycin 500 mg (02/20/20 2138)  . cefTRIAXone (ROCEPHIN)  IV 2 g (02/20/20 2130)  . remdesivir 100 mg in NS 100 mL 100 mg (02/21/20 1011)     LOS: 2 days   Time spent: 43 minutes   Blain Pais, MD [facility]  If 7PM-7AM, please contact night-coverage [email or web address]  02/21/2020, 5:47 PM

## 2020-02-22 LAB — CBC WITH DIFFERENTIAL/PLATELET
Abs Immature Granulocytes: 0.03 10*3/uL (ref 0.00–0.07)
Basophils Absolute: 0 10*3/uL (ref 0.0–0.1)
Basophils Relative: 0 %
Eosinophils Absolute: 0 10*3/uL (ref 0.0–0.5)
Eosinophils Relative: 0 %
HCT: 32.8 % — ABNORMAL LOW (ref 36.0–46.0)
Hemoglobin: 10.5 g/dL — ABNORMAL LOW (ref 12.0–15.0)
Immature Granulocytes: 1 %
Lymphocytes Relative: 21 %
Lymphs Abs: 0.7 10*3/uL (ref 0.7–4.0)
MCH: 30.8 pg (ref 26.0–34.0)
MCHC: 32 g/dL (ref 30.0–36.0)
MCV: 96.2 fL (ref 80.0–100.0)
Monocytes Absolute: 0.1 10*3/uL (ref 0.1–1.0)
Monocytes Relative: 4 %
Neutro Abs: 2.3 10*3/uL (ref 1.7–7.7)
Neutrophils Relative %: 74 %
Platelets: 286 10*3/uL (ref 150–400)
RBC: 3.41 MIL/uL — ABNORMAL LOW (ref 3.87–5.11)
RDW: 12.9 % (ref 11.5–15.5)
WBC: 3.2 10*3/uL — ABNORMAL LOW (ref 4.0–10.5)
nRBC: 0.6 % — ABNORMAL HIGH (ref 0.0–0.2)

## 2020-02-22 LAB — COMPREHENSIVE METABOLIC PANEL
ALT: 52 U/L — ABNORMAL HIGH (ref 0–44)
AST: 65 U/L — ABNORMAL HIGH (ref 15–41)
Albumin: 2.9 g/dL — ABNORMAL LOW (ref 3.5–5.0)
Alkaline Phosphatase: 28 U/L — ABNORMAL LOW (ref 38–126)
Anion gap: 12 (ref 5–15)
BUN: 32 mg/dL — ABNORMAL HIGH (ref 6–20)
CO2: 24 mmol/L (ref 22–32)
Calcium: 8.4 mg/dL — ABNORMAL LOW (ref 8.9–10.3)
Chloride: 110 mmol/L (ref 98–111)
Creatinine, Ser: 0.87 mg/dL (ref 0.44–1.00)
GFR, Estimated: >60 mL/min (ref >60)
Glucose, Bld: 174 mg/dL — ABNORMAL HIGH (ref 70–99)
Potassium: 3.8 mmol/L (ref 3.5–5.1)
Sodium: 146 mmol/L — ABNORMAL HIGH (ref 135–145)
Total Bilirubin: 0.2 mg/dL — ABNORMAL LOW (ref 0.3–1.2)
Total Protein: 5.9 g/dL — ABNORMAL LOW (ref 6.5–8.1)

## 2020-02-22 LAB — GLUCOSE, CAPILLARY
Glucose-Capillary: 155 mg/dL — ABNORMAL HIGH (ref 70–99)
Glucose-Capillary: 249 mg/dL — ABNORMAL HIGH (ref 70–99)
Glucose-Capillary: 206 mg/dL — ABNORMAL HIGH (ref 70–99)
Glucose-Capillary: 156 mg/dL — ABNORMAL HIGH (ref 70–99)

## 2020-02-22 LAB — D-DIMER, QUANTITATIVE: D-Dimer, Quant: 0.5 ug/mL-FEU (ref 0.00–0.50)

## 2020-02-22 LAB — AEROBIC CULTURE W GRAM STAIN (SUPERFICIAL SPECIMEN)
Culture: NORMAL
Gram Stain: NONE SEEN

## 2020-02-22 LAB — MAGNESIUM: Magnesium: 2.3 mg/dL (ref 1.7–2.4)

## 2020-02-22 LAB — TSH: TSH: 1.717 u[IU]/mL (ref 0.350–4.500)

## 2020-02-22 LAB — C-REACTIVE PROTEIN: CRP: 1 mg/dL — ABNORMAL HIGH (ref <1.0)

## 2020-02-22 LAB — FERRITIN: Ferritin: 224 ng/mL (ref 11–307)

## 2020-02-22 NOTE — Progress Notes (Signed)
HR in the low 40's while asleep. No distress noted. On-call NP E.Ouma notified via AMION, no response no new orders. Will continue to monitor the patient.

## 2020-02-22 NOTE — Progress Notes (Signed)
Patient noted to be bradycardic in the low 40's without associated symptoms of chest pain, dizziness or lightheadedness, confusion, shortness of breath, or difficulty breathing.  On review of her chart she is currently not on any AV nodal agents, no history of thyroid dysfunction, electrolyte abnormality, or obstructive sleep apnea.  -We will obtain EKG -Check electrolytes -Check TSH -Avoid AV nodal agents   Webb Silversmith, BSN, MSN, DNP, CCRN,FNP-C, AGACNP-BC [facility] Nurse Practitioner  [facility]

## 2020-02-22 NOTE — Progress Notes (Signed)
PROGRESS NOTE    Makayla Roberts  GUY:[id number] DOB: 02-Oct-1985 DOA: 02/18/2020 PCP: Gildardo Pounds, NP   Brief Narrative:  This is a 35 year old female with past medical history of developmental delay, left eye blindness, nonverbal, HFpEF, diabetes who presented to [facility] with a fever x5 days.  Fever has been up to 103 F at home and associated with chills and shortness of breath, diarrhea, decreased appetite and vomiting.  Mother states that the patient has been having difficulty eating for at least 2 years but more acutely over the past week to 2 weeks patient has had worsening episodes of choking with p.o. intake. Found to be COVID-19 positive on home test without any known sick contacts or recent travel.  In the ED patient noted to be febrile, tachycardic and tachypneic meeting sepsis criteria with hypotension resolving with IV fluids, notably hypoxic in the 80s on room air at rest.  Assessment & Plan:   Principal Problem:   Acute respiratory disease due to COVID-19 virus Active Problems:   Developmental disability   Type 2 diabetes mellitus without complication (HCC)   Hypotension due to hypovolemia   Acute respiratory failure with hypoxia (HCC)   Severe sepsis (HCC)   Aspiration pneumonia (HCC)   AKI (acute kidney injury) ([place])   Severe sepsis without septic shock    Acute hypoxic respiratory failure secondary to likely acute on chronic aspiration pneumonia Likely concurrent incidental COVID infection - Sepsis criteria met at admission: Febrile, tachycardic, tachypneic, leukopenic - Patient has worsening acute on chronic aspiration per mother at bedside  -likely in the setting of comorbid conditions mental status baseline -have asked speech to evaluate and educate the mother on ways to feed her daughter more safely. - Procalcitonin elevated above 1 continue ceftriaxone and azithromycin, chest x-ray personally reviewed with questionable right middle lobe opacifications and air  bronchograms concerning for aspiration - Less likely acute COVID-19 pneumonia given mild hypoxia, minimally elevated inflammatory markers with other clear etiology for acute hypoxia -we will continue Remdesivir and steroids for 5 and 10 days respectively, low threshold to discontinue Remdesivir early.  Patient does not meet criteria for baricitinib given likely active ongoing bacterial pneumonia. SpO2: 97 % O2 Flow Rate (L/min): 2 L/min  02/21/20 Patient being weaned off to RA today, will continue monitoring O2 sats. Continue remdesivir, decadron, and Abx for pna.  02/22/20 Remains on RA with stable O2 sats but has occasional cough with rhonchi. Will continue steroids, discontinue Remdesivir due to bradycardia.   Bradycardia. HR noted to be in low 40s on 1/5 evening. HR remains in the upper 40s to 50s. Will dc remdesivir as this medication has had reported effect of bradycardia. Continue to monitor.   Acute transient hypotension in the setting of sepsis, hypovolemia and poor p.o. intake  - Initial blood pressure systolic in the 56L with MAP of 40, resolved quickly with IV fluids - Continue to follow clinically, blood pressure markedly well controlled this morning continue IV fluids well poor p.o. intake ongoing especially while n.p.o. in the setting of aspiration.  02/21/20 No longer on IV fluids, will start pureed diet today, after being seen by SLP with improved mentation.  02/22/20 Tolerating pureed diet without a cough while eating. Per nursing report, she had sip of water today but had cough with it. Will continue thickened liquids and pureed diet with aspiration precautions as recommended per SLP. Per her mother at the beside, the patient's appetite has been poor. Will request dietitian consultation. Of note,  the patient and her family prefer no pork, they eat fish, eggs, and dairy.   AKI likely multifactorial: Lisinopril, hypotension, sepsis, hypovolemia.  Improving - Creatinine 2.13 at  presentation, baseline 0.7 - Hold lisinopril, especially in the setting of transient hypotension - Resolving with IV fluids, creatinine now within normal limits 02/21/20 Scr now at baseline, normal.    Hypertension, essential -currently hypotensive/normotensive -Continue to hold lisinopril given AKI and borderline hypotension  Diabetes, type II, non-insulin-dependent, well controlled - A1c 6.2 - Hold home Metformin - Start insulin per COVID-19 steroid order set, hypoglycemic protocol 02/21/20 Continue current regimen 02/22/20 Has hyperglycemia but in the setting of steroid use. Continue current regimen.   History of developmental delay - Nonverbal at baseline - Continue home Risperdal  02/22/20 Continue to allow mother and caregiver at the bedside.   DVT prophylaxis: Lovenox Code Status: Full Family Communication: Mother at bedside, Fish farm manager available through [place] system. Mother updated on today's plan on 02/22/20.   Status is: Inpatient  Dispo: The patient is from: Home              Anticipated d/c is to: Home. Needs PT and OT eval for [facility] and possible DME needs.               Anticipated d/c date is: 1-2 days.               Patient currently not medically stable for discharge given above with acute hypoxia, bacterial pneumonia, aspiration and need for ongoing IV antibiotics, IV antivirals and close monitoring  Consultants:   None  Procedures:   None  Antimicrobials:  azithromycin, ceftriaxone: 02/19/20 -ongoing remdesivir discontinued on 02/22/20 due to concerns of bradycardia.   Subjective: Patient with eyes open, nonverbal, calm, in no distress, occasional wet cough. HR has remains in the 40s.    Objective: Vitals:   02/22/20 0400 02/22/20 1503 02/22/20 1510 02/22/20 1613  BP: 131/81 (!) 130/92 (!) 123/30   Pulse: (!) 43 (!) 47 (!) 48   Resp:   20   Temp:  (!) 96.6 F (35.9 C) 97.8 F (36.6 C)   TempSrc:  Axillary Axillary   SpO2:  97%  97%  Weight:       Height:        Intake/Output Summary (Last 24 hours) at 02/22/2020 1945 Last data filed at 02/21/2020 2200 Gross per 24 hour  Intake -  Output 200 ml  Net -200 ml   Filed Weights   02/17/20 1949 02/19/20 1001  Weight: 63.1 kg 56.6 kg    Examination:  General:  Pleasantly resting in bed, No acute distress.    HEENT: Atraumatic, MMM.  Lungs:  Occasional cough, normal rate Heart: bradycardia, S1 and S2 present.  Abdomen:  Soft, nontender, nondistended.   Extremities: Without cyanosis, or edema.  Skin:  Warm and dry, no erythema  Data Reviewed: I have personally reviewed following labs and imaging studies  CBC: Recent Labs  Lab 02/18/20 0256 02/19/20 1018 02/20/20 0259 02/21/20 0544 02/22/20 0511  WBC 3.5* 3.3* 2.5* 4.4 3.2*  NEUTROABS 1.9 2.2 1.4* 3.1 2.3  HGB 10.1* 10.4* 10.1* 11.1* 10.5*  HCT 30.8* 31.6* 31.0* 35.5* 32.8*  MCV 94.2 93.5 94.2 96.7 96.2  PLT 166 175 PLATELET CLUMPS NOTED ON SMEAR, COUNT APPEARS DECREASED 257 790   Basic Metabolic Panel: Recent Labs  Lab 02/18/20 1726 02/19/20 1018 02/20/20 0259 02/21/20 0544 02/22/20 0511  NA 139 140 141 144 146*  K 4.4 4.0  4.5 4.1 3.8  CL 108 106 106 110 110  CO2 23 22 20* 20* 24  GLUCOSE 123* 150* 140* 158* 174*  BUN $Re'17 11 15 'Pst$ 23* 32*  CREATININE 1.10* 0.72 0.68 0.83 0.87  CALCIUM 7.8* 8.1* 8.0* 8.1* 8.4*  MG  --   --  1.7 2.0 2.3   GFR: Estimated Creatinine Clearance: 66.6 mL/min (by C-G formula based on SCr of 0.87 mg/dL). Liver Function Tests: Recent Labs  Lab 02/18/20 0256 02/19/20 1018 02/20/20 0259 02/21/20 0544 02/22/20 0511  AST $Re'24 23 23 28 'NVa$ 65*  ALT $Re'24 20 19 23 'FDp$ 52*  ALKPHOS 28* 24* 26* 28* 28*  BILITOT 0.4 0.5 0.3 0.5 0.2*  PROT 6.9 6.1* 6.3* 6.2* 5.9*  ALBUMIN 3.8 3.0* 3.1* 3.1* 2.9*   No results for input(s): LIPASE, AMYLASE in the last 168 hours. No results for input(s): AMMONIA in the last 168 hours. Coagulation Profile: Recent Labs  Lab 02/18/20 0256  INR 1.0   Cardiac  Enzymes: No results for input(s): CKTOTAL, CKMB, CKMBINDEX, TROPONINI in the last 168 hours. BNP (last 3 results) No results for input(s): PROBNP in the last 8760 hours. HbA1C: No results for input(s): HGBA1C in the last 72 hours. CBG: Recent Labs  Lab 02/21/20 1727 02/21/20 2020 02/22/20 0810 02/22/20 1227 02/22/20 1804  GLUCAP 181* 182* 155* 249* 206*   Lipid Profile: No results for input(s): CHOL, HDL, LDLCALC, TRIG, CHOLHDL, LDLDIRECT in the last 72 hours. Thyroid Function Tests: Recent Labs    02/22/20 0511  TSH 1.717   Anemia Panel: Recent Labs    02/21/20 0544 02/22/20 0511  FERRITIN 218 224   Sepsis Labs: Recent Labs  Lab 02/18/20 0256 02/18/20 0300 02/18/20 0538 02/19/20 1018  PROCALCITON  --  6.11  --  1.08  LATICACIDVEN 1.3  --  1.8  --     Recent Results (from the past 240 hour(s))  Resp Panel by RT-PCR (Flu A&B, Covid) Nasopharyngeal Swab     Status: Abnormal   Collection Time: 02/18/20  2:33 AM   Specimen: Nasopharyngeal Swab; Nasopharyngeal(NP) swabs in vial transport medium  Result Value Ref Range Status   SARS Coronavirus 2 by RT PCR POSITIVE (A) NEGATIVE Final    Comment: RESULT CALLED TO, READ BACK BY AND VERIFIED WITH: POWELL,V AT 0411 ON [id number] BY CHERESNOWSKY,T (NOTE) SARS-CoV-2 target nucleic acids are DETECTED.  The SARS-CoV-2 RNA is generally detectable in upper respiratory specimens during the acute phase of infection. Positive results are indicative of the presence of the identified virus, but do not rule out bacterial infection or co-infection with other pathogens not detected by the test. Clinical correlation with patient history and other diagnostic information is necessary to determine patient infection status. The expected result is Negative.  Fact Sheet for Patients: EntrepreneurPulse.com.au  Fact Sheet for Healthcare Providers: IncredibleEmployment.be  This test is not yet approved  or cleared by the [place] FDA and  has been authorized for detection and/or diagnosis of SARS-CoV-2 by FDA under an Emergency Use Authorization (EUA).  This EUA will remain in effect (meaning this  test can be used) for the duration of  the COVID-19 declaration under Section 564(b)(1) of the Act, 21 U.S.C. section 360bbb-3(b)(1), unless the authorization is terminated or revoked sooner.     Influenza A by PCR NEGATIVE NEGATIVE Final   Influenza B by PCR NEGATIVE NEGATIVE Final    Comment: (NOTE) The Xpert Xpress SARS-CoV-2/FLU/RSV plus assay is intended as an aid in the diagnosis of influenza  from Nasopharyngeal swab specimens and should not be used as a sole basis for treatment. Nasal washings and aspirates are unacceptable for Xpert Xpress SARS-CoV-2/FLU/RSV testing.  Fact Sheet for Patients: EntrepreneurPulse.com.au  Fact Sheet for Healthcare Providers: IncredibleEmployment.be  This test is not yet approved or cleared by the [place] FDA and has been authorized for detection and/or diagnosis of SARS-CoV-2 by FDA under an Emergency Use Authorization (EUA). This EUA will remain in effect (meaning this test can be used) for the duration of the COVID-19 declaration under Section 564(b)(1) of the Act, 21 U.S.C. section 360bbb-3(b)(1), unless the authorization is terminated or revoked.  Performed at [facility], [address] [place], [place] [address]   Urine culture     Status: None   Collection Time: 02/18/20  2:56 AM   Specimen: In/Out Cath Urine  Result Value Ref Range Status   Specimen Description   Final    IN/OUT CATH URINE Performed at [facility], [address] [place], [place] [address]    Special Requests   Final    NONE Performed at [facility], [address] [place], [place] [address]    Culture   Final    NO GROWTH Performed at [facility], [place] [address],  [place], [place] [address]    Report Status 02/19/2020 FINAL  Final  Blood Culture (routine x 2)     Status: None (Preliminary result)   Collection Time: 02/18/20  2:58 AM   Specimen: BLOOD RIGHT FOREARM  Result Value Ref Range Status   Specimen Description   Final    BLOOD RIGHT FOREARM Performed at [facility], [address] [place], [place] [address]    Special Requests   Final    BOTTLES DRAWN AEROBIC AND ANAEROBIC Blood Culture adequate volume Performed at [facility], [address] [place], [place] [address]    Culture   Final    NO GROWTH 4 DAYS Performed at [facility], [place] [address] [place], [place] [address]    Report Status PENDING  Incomplete  Blood Culture (routine x 2)     Status: None (Preliminary result)   Collection Time: 02/18/20  2:58 AM   Specimen: BLOOD LEFT ARM  Result Value Ref Range Status   Specimen Description   Final    BLOOD LEFT ARM Performed at [facility], [address] [place], [place] [address]    Special Requests   Final    BOTTLES DRAWN AEROBIC AND ANAEROBIC Blood Culture adequate volume Performed at [facility], [address] [place], [place] [address]    Culture   Final    NO GROWTH 4 DAYS Performed at [facility], [place] [address] [place], [place] [address]    Report Status PENDING  Incomplete  MRSA PCR Screening     Status: None   Collection Time: 02/19/20  9:29 AM   Specimen: Nasal Mucosa; Nasopharyngeal  Result Value Ref Range Status   MRSA by PCR NEGATIVE NEGATIVE Final    Comment:        The GeneXpert MRSA Assay (FDA approved for NASAL specimens only), is one component of a comprehensive MRSA colonization surveillance program. It is not intended to diagnose MRSA infection nor to guide or monitor treatment for MRSA infections. Performed at [facility], [place] [address], [place], [place] [address]  Aerobic Culture (superficial specimen)      Status: None   Collection Time: 02/19/20  5:04 PM   Specimen: Vaginal  Result Value Ref Range Status   Specimen Description   Final    VAGINA Performed at [place] [facility] [address] [place], [place] [address]    Special Requests   Final    NONE Performed at [facility], [place] [address] [place], [place] [address]    Gram Stain NO WBC SEEN FEW GRAM POSITIVE RODS   Final   Culture   Final    RARE NORMAL VAGINAL FLORA NO GROUP B STREP (S.AGALACTIAE) ISOLATED NO STAPHYLOCOCCUS AUREUS ISOLATED Performed at [facility], [address] [place], [place] [address]    Report Status 02/22/2020 FINAL  Final         Radiology Studies: No results found.      Scheduled Meds: . Chlorhexidine Gluconate Cloth  6 each Topical Daily  . enoxaparin (LOVENOX) injection  40 mg Subcutaneous Daily  . insulin aspart  0-15 Units Subcutaneous TID WC  . insulin detemir  0.1 Units/kg Subcutaneous BID  . mouth rinse  15 mL Mouth Rinse BID  . predniSONE  50 mg Oral Daily  . risperiDONE  1.5 mg Oral BID  . sodium chloride flush  3 mL Intravenous Q12H   Continuous Infusions: . azithromycin 500 mg (02/21/20 2203)  . cefTRIAXone (ROCEPHIN)  IV 2 g (02/21/20 2011)  . remdesivir 100 mg in NS 100 mL 100 mg (02/22/20 1100)     LOS: 3 days   Time spent:35 minutes   Blain Pais, MD [facility]  If 7PM-7AM, please contact night-coverage [email or web address]  02/22/2020, 7:45 PM

## 2020-02-23 LAB — CBC WITH DIFFERENTIAL/PLATELET
Abs Immature Granulocytes: 0.04 10*3/uL (ref 0.00–0.07)
Basophils Absolute: 0 10*3/uL (ref 0.0–0.1)
Basophils Relative: 0 %
Eosinophils Absolute: 0 10*3/uL (ref 0.0–0.5)
Eosinophils Relative: 0 %
HCT: 33.6 % — ABNORMAL LOW (ref 36.0–46.0)
Hemoglobin: 10.7 g/dL — ABNORMAL LOW (ref 12.0–15.0)
Immature Granulocytes: 1 %
Lymphocytes Relative: 34 %
Lymphs Abs: 1.2 10*3/uL (ref 0.7–4.0)
MCH: 30.7 pg (ref 26.0–34.0)
MCHC: 31.8 g/dL (ref 30.0–36.0)
MCV: 96.6 fL (ref 80.0–100.0)
Monocytes Absolute: 0.3 10*3/uL (ref 0.1–1.0)
Monocytes Relative: 8 %
Neutro Abs: 2.1 10*3/uL (ref 1.7–7.7)
Neutrophils Relative %: 57 %
Platelets: 315 10*3/uL (ref 150–400)
RBC: 3.48 MIL/uL — ABNORMAL LOW (ref 3.87–5.11)
RDW: 13 % (ref 11.5–15.5)
WBC: 3.7 10*3/uL — ABNORMAL LOW (ref 4.0–10.5)
nRBC: 0.8 % — ABNORMAL HIGH (ref 0.0–0.2)

## 2020-02-23 LAB — BASIC METABOLIC PANEL
Anion gap: 9 (ref 5–15)
BUN: 27 mg/dL — ABNORMAL HIGH (ref 6–20)
CO2: 26 mmol/L (ref 22–32)
Calcium: 8.4 mg/dL — ABNORMAL LOW (ref 8.9–10.3)
Chloride: 111 mmol/L (ref 98–111)
Creatinine, Ser: 0.87 mg/dL (ref 0.44–1.00)
GFR, Estimated: >60 mL/min (ref >60)
Glucose, Bld: 211 mg/dL — ABNORMAL HIGH (ref 70–99)
Potassium: 3.1 mmol/L — ABNORMAL LOW (ref 3.5–5.1)
Sodium: 146 mmol/L — ABNORMAL HIGH (ref 135–145)

## 2020-02-23 LAB — COMPREHENSIVE METABOLIC PANEL
ALT: 57 U/L — ABNORMAL HIGH (ref 0–44)
AST: 49 U/L — ABNORMAL HIGH (ref 15–41)
Albumin: 2.9 g/dL — ABNORMAL LOW (ref 3.5–5.0)
Alkaline Phosphatase: 27 U/L — ABNORMAL LOW (ref 38–126)
Anion gap: 8 (ref 5–15)
BUN: 30 mg/dL — ABNORMAL HIGH (ref 6–20)
CO2: 27 mmol/L (ref 22–32)
Calcium: 8.5 mg/dL — ABNORMAL LOW (ref 8.9–10.3)
Chloride: 116 mmol/L — ABNORMAL HIGH (ref 98–111)
Creatinine, Ser: 0.85 mg/dL (ref 0.44–1.00)
GFR, Estimated: >60 mL/min (ref >60)
Glucose, Bld: 75 mg/dL (ref 70–99)
Potassium: 3.4 mmol/L — ABNORMAL LOW (ref 3.5–5.1)
Sodium: 151 mmol/L — ABNORMAL HIGH (ref 135–145)
Total Bilirubin: 0.5 mg/dL (ref 0.3–1.2)
Total Protein: 5.6 g/dL — ABNORMAL LOW (ref 6.5–8.1)

## 2020-02-23 LAB — GLUCOSE, CAPILLARY
Glucose-Capillary: 98 mg/dL (ref 70–99)
Glucose-Capillary: 198 mg/dL — ABNORMAL HIGH (ref 70–99)

## 2020-02-23 LAB — FERRITIN: Ferritin: 182 ng/mL (ref 11–307)

## 2020-02-23 LAB — MAGNESIUM: Magnesium: 2.3 mg/dL (ref 1.7–2.4)

## 2020-02-23 LAB — D-DIMER, QUANTITATIVE: D-Dimer, Quant: 0.65 ug/mL-FEU — ABNORMAL HIGH (ref 0.00–0.50)

## 2020-02-23 LAB — C-REACTIVE PROTEIN: CRP: 0.8 mg/dL (ref <1.0)

## 2020-02-23 MED ORDER — ENSURE ENLIVE PO LIQD: 237.0000 mL | Freq: Two times a day (BID) | ORAL | 12 refills | Status: DC

## 2020-02-23 MED ORDER — ACETAMINOPHEN 325 MG PO TABS: 650.0000 mg | ORAL_TABLET | Freq: Four times a day (QID) | ORAL | Status: AC | PRN

## 2020-02-23 MED ORDER — ENSURE ENLIVE PO LIQD: 237.0000 mL | Freq: Two times a day (BID) | ORAL | Status: DC

## 2020-02-23 MED ORDER — DEXTROSE 5 % IV SOLN
INTRAVENOUS | Status: DC
  Administered 2020-02-23: 75 mL/h via INTRAVENOUS

## 2020-02-23 MED ORDER — STARCH (THICKENING) PO POWD: ORAL | 0 refills | Status: DC

## 2020-02-23 MED ORDER — GUAIFENESIN-DM 100-10 MG/5ML PO SYRP: 10.0000 mL | ORAL_SOLUTION | ORAL | 0 refills | Status: DC | PRN

## 2020-02-23 MED ORDER — POTASSIUM CHLORIDE 20 MEQ PO PACK
40.0000 meq | PACK | Freq: Once | ORAL | Status: DC
  Filled 2020-02-23: qty 2

## 2020-02-23 NOTE — Progress Notes (Signed)
  Speech Language Pathology Treatment: Dysphagia  Patient Details Name: Valory Wetherby MRN: [id number] DOB: March 30, 1985 Today's Date: 02/23/2020 Time: [id number] SLP Time Calculation (min) (ACUTE ONLY): 24 min  Assessment / Plan / Recommendation Clinical Impression  Mother present but she stated "no" to SLP when SLP brought Ipad close to her.   Mom wanted SLP to speak to Jeanette Caprice (her daughter in law) regarding care plan.  Sophia states pt's PCP stated pt has an oropharyngeal dysphagia - and SLP explained that SLPs treat patients with oropharyngeal dysphagia.   SLP discussed (in mother and pt's presence) option for MBS vs continue modified diet and return as OP for MBS.  Explained what MBS entails and recommendation for her to drink thickened liquids if keeps pt from overtly coughing until MBS as OP can be conducted.   In addition, reviewed importance of hydration for this pt with COVID.  Discussed thin water consumption as option - most notably between meals for hydration and less aspiration risk.  Meal tray at bedside looked completely untouched, ? if pt does not like the food.  Advised Sophia to have pt's mothetr to obtain thickener from pharmacy and discussed drinks that are normally nectar thick - such tomatoe juice, V8.  Sophia questioned re: Ensure, to which SLP advised if keeps pt from coughing, thus providing more comfort with po, Ensure is a good option.  Pt's mother appeared less interactive and tired today.  Recommend follow up as an OP for MBS and consider GI referral and/or esophagram to allow all phase of swallow to be tested.  Pt's dysphagia symptoms (per mother's statement of coughing, gagging, etc) could be indicative of oropharyngeal and/or esophageal dysphagia.    HPI HPI: 35 yo female with h/o MR, nonverbal, DM, blind in left eye, - per mother pt can hear but does not speak- admitted after five days of fever and N/V in setting of COVID 19 and was hypotensive with AKI upon admit.  CXR clear.   Pt is on medications for COVID and IV fluids.  Swallow eval ordered.  Per mom *with use of translator" pt has had dysphagia for months - causing her to choke and cough - She states pt does not spit out the food or drink however.  She has not required the heimlich manuever.  Pt CXR negative but pt coughing with po.  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      SLP Plan  Other (Comment);MBS (MBS as an OP)       Recommendations  Diet recommendations: Dysphagia 1 (puree);Nectar-thick liquid (consider THIN water between meals) Liquids provided via: Straw;Teaspoon Medication Administration: Crushed with puree Supervision: Full supervision/cueing for compensatory strategies;Trained caregiver to feed patient Compensations: Minimize environmental distractions;Slow rate;Small sips/bites;Other (Comment) (check oral cavity for clearing) Postural Changes and/or Swallow Maneuvers: Seated upright 90 degrees                Oral Care Recommendations: Oral care QID Follow up Recommendations: 24 hour supervision/assistance SLP Visit Diagnosis: Dysphagia, unspecified (R13.10);Dysphagia, oral phase (R13.11) Plan: Other (Comment);MBS (MBS as an OP)       GO                Chales Abrahams 02/23/2020, 2:19 PM   Rolena Infante, MS [facility] SLP Acute Rehab Services Office [phone] Pager [phone]

## 2020-02-23 NOTE — Evaluation (Signed)
Occupational Therapy Evaluation Patient Details Name: Makayla Roberts MRN: [id number] DOB: [birth year] Today's Date: 02/23/2020    History of Present Illness This is a 35 year old female with  past medical history of developmental delay, left eye blindness, nonverbal, HFpEF, diabetes who presented to [facility] 02/18/20 with a fever, associated with chills and shortness of breath, diarrhea, decreased appetite and vomiting. Patient tested positive for Covid/   Clinical Impression   Patient presents with above medical history supine in bed with catheter, telemetry, o2 sat monitoring and safety mittens. Safety mittens removed for evaluation and use of interpreter and mother for evaluation. Patient demonstrates functional ROM of upper and lower extremities but generalized weakness. Patient exhibits generalized low tone. Patient min assist to transfer to side of bed and ambulate in room with tactile cues and hand hold assists. Patient unsteady but without over loss of balance. Patient seated in recliner. Patient began pulling at her lines and leads and safety mittens had to be replaced. O2  sats WNL during evaluation. Patient max assist to wash face. Patient requiring max - total assist for ADLs due to non-typical clothing, non-typical environment and the use of hospital equipment. Patient's mother reports she has been incontinent and then the use of the foley since hospitalization but can typically toilet by herself. Patient will benefit from skilled OT services while in hospital to improve activity tolerance and functional abilities while in hospital. Suspect patient will do better in home environment once she is without foley and monitoring devices and has can be freed for normal daily tasks. I think family will be able to manage patient and resume her daily routine without therapy services at home. Will attempt to progress patient while in hospital - though may be difficult due to hospital environment.       Follow Up  Recommendations  No OT follow up    Equipment Recommendations  None recommended by OT    Recommendations for Other Services       Precautions / Restrictions Precautions Precautions: Fall      Mobility Bed Mobility Overal bed mobility: Needs Assistance Bed Mobility: Supine to Sit     Supine to sit: Min guard;Min assist     General bed mobility comments: multimodal cues to initiate moving legs, sitting upright    Transfers Overall transfer level: Needs assistance Equipment used: 2 person hand held assist Transfers: Sit to/from Stand Sit to Stand: Min assist         General transfer comment: mother assisted with 1 HH anf 1 HH by therapist.    Balance Overall balance assessment: Needs assistance Sitting-balance support: No upper extremity supported;Feet supported Sitting balance-Leahy Scale: Fair Sitting balance - Comments: close supervision   Standing balance support: Bilateral upper extremity supported Standing balance-Leahy Scale: Poor Standing balance comment: needs assistance to stabilize                           ADL either performed or assessed with clinical judgement   ADL Overall ADL's : Needs assistance/impaired Eating/Feeding: Total assistance Eating/Feeding Details (indicate cue type and reason): Total assist to feed at this time due to safety mittens (patient pulling at lines and leads) Grooming: Maximal assistance Grooming Details (indicate cue type and reason): Max assist to wash face Upper Body Bathing: Maximal assistance   Lower Body Bathing: Maximal assistance   Upper Body Dressing : Maximal assistance   Lower Body Dressing: Maximal assistance   Toilet Transfer: Minimal assistance;Regular Toilet  Toileting- Clothing Manipulation and Hygiene: Total assistance Toileting - Clothing Manipulation Details (indicate cue type and reason): use of foley, has been incontinent since in hospital             Vision   Additional  Comments: Blind in left eye     Perception     Praxis      Pertinent Vitals/Pain Pain Assessment: No/denies pain Faces Pain Scale: No hurt Pain Location: no indication of pain     Hand Dominance Left   Extremity/Trunk Assessment Upper Extremity Assessment Upper Extremity Assessment: RUE deficits/detail;LUE deficits/detail RUE Deficits / Details: Grossly functional ROM, unable to test strength as patient unablle to follow commands. LUE Deficits / Details: Grossly functional ROM, unable to test strength as patient unablle to follow commands.   Lower Extremity Assessment Lower Extremity Assessment: Defer to PT evaluation   Cervical / Trunk Assessment Cervical / Trunk Assessment: Normal   Communication Communication Communication: Interpreter utilized;Expressive difficulties   Cognition Arousal/Alertness: Awake/alert Behavior During Therapy: WFL for tasks assessed/performed Overall Cognitive Status: History of cognitive impairments - at baseline                                 General Comments: used interpreter services for communication with mother, patient follows with gestures/tactile cues inconsistently.   General Comments       Exercises     Shoulder Instructions      Home Living Family/patient expects to be discharged to:: Private residence Living Arrangements: Parent Available Help at Discharge: Family;Available 24 hours/day Type of Home: House Home Access: Stairs to enter [employer] of Steps: 2   Home Layout: One level     Bathroom Shower/Tub: Teacher, early years/pre: Standard     Home Equipment: None          Prior Functioning/Environment Level of Independence: Needs assistance  Gait / Transfers Assistance Needed: independent without AD, Attends school 5 d/ wk,.rides bus. ADL's / Homemaking Assistance Needed: mother assists with dressig and bathing PRN Communication / Swallowing Assistance Needed: nonverbal.  smiles at times. Comments: Cries at times. Mother states "she is bored and wants to go home."        OT Problem List: Decreased strength;Decreased activity tolerance;Impaired balance (sitting and/or standing);Decreased cognition;Decreased safety awareness      OT Treatment/Interventions:      OT Goals(Current goals can be found in the care plan section) Acute Rehab OT Goals Patient Stated Goal: go home today OT Goal Formulation: With family Time For Goal Achievement: 03/08/20 Potential to Achieve Goals: Fair  OT Frequency: Min 2X/week   Barriers to D/C:            Co-evaluation PT/OT/SLP Co-Evaluation/Treatment: Yes Reason for Co-Treatment: Necessary to address cognition/behavior during functional activity;For patient/therapist safety PT goals addressed during session: Mobility/safety with mobility OT goals addressed during session: ADL's and self-care      AM-PAC OT "6 Clicks" Daily Activity     Outcome Measure Help from another person eating meals?: Total Help from another person taking care of personal grooming?: A Lot Help from another person toileting, which includes using toliet, bedpan, or urinal?: Total Help from another person bathing (including washing, rinsing, drying)?: A Lot Help from another person to put on and taking off regular upper body clothing?: A Lot Help from another person to put on and taking off regular lower body clothing?: A Lot 6 Click Score: 10  End of Session Equipment Utilized During Treatment: Gait belt Nurse Communication: Mobility status  Activity Tolerance: Patient tolerated treatment well Patient left: in chair;with call bell/phone within reach;with chair alarm set;with family/visitor present  OT Visit Diagnosis: Unsteadiness on feet (R26.81);Cognitive communication deficit (R41.841);Feeding difficulties (R63.3)                Time: 3736-6815 OT Time Calculation (min): 33 min Charges:  OT General Charges $OT Visit: 1 Visit OT  Evaluation $OT Eval Moderate Complexity: 1 Mod  Ali Mclaurin, OTR/L Acute Care Rehab Services  Office [phone] Pager: [phone]   Kelli Churn 02/23/2020, 2:14 PM

## 2020-02-23 NOTE — Discharge Summary (Signed)
Physician Discharge Summary  Makayla Roberts ZOX:[id number] DOB: 1985-06-13 DOA: 02/18/2020  PCP: Gildardo Pounds, NP  Admit date: 02/18/2020 Discharge date: 02/23/2020  Admitted From: Home Disposition:  Home  Recommendations for Outpatient Follow-up:  1. Follow up with PCP in 1 weeks   Home Health:No Equipment/Devices:No  Discharge Condition:Stable  CODE STATUS:Full Diet recommendation: Carb mod pureed diet with nectar thick  Brief/Interim Summary: She is more interactive today, was able to ambulate without assistance which is her baseline. Her cough is improving. She is tolerating a pureed diet with nectar thick liquids. Her O2 sats remains stable on RA.  Her sodium level was up overnight but repeat labs showed improved after she had gentle IV fluids for hydration.  Her mother was at the bedside with her daughter-in-law interpreting on the phone as patient's mother declined [facility] interpreter.  The patient completed treatment for COVID with remdesivir except for the last dose due to bradycardia thought to be secondary to the effect of this medication. Her HR trended up from 40s to upper 50s today.  Steroids will not be continued as she is no longer hypoxic.  She completed antibiotic treatment for pneumonia and will need close monitoring by per PCP to decide if she should continue po antibiotics in the outpatient setting. She will need to continue a pureed diet with nectar thick liquids, this was discussed with her mother. She needs to continue aspiration precautions at home.  She may have a lingering cough due to COVID but will need continued monitoring by her PCP as she recovers, this was discussed with her family.   Per SLP, recommend possible outpatient modified barium swallow evaluation and GI referral per PCP.   Please see below for further details of this hospital course prior to today:   Severe sepsis without septic shock Acute hypoxic respiratory failure secondary to likely  acute on chronic aspiration pneumonia Likely concurrent incidental COVID infection - Sepsis criteria met at admission: Febrile, tachycardic, tachypneic, leukopenic - Patient has worsening acute on chronic aspiration per mother at bedside  -likely in the setting of comorbid conditions mental status baseline -have asked speech to evaluate and educate the mother on ways to feed her daughter more safely. - Procalcitonin elevated above 1 continue ceftriaxone and azithromycin, chest x-ray personally reviewed with questionable right middle lobe opacifications and air bronchograms concerning for aspiration - Less likely acute COVID-19 pneumonia given mild hypoxia, minimally elevated inflammatory markers with other clear etiology for acute hypoxia -we will continue Remdesivir and steroids for 5 and 10 days respectively, low threshold to discontinue Remdesivir early.  Patient does not meet criteria for baricitinib given likely active ongoing bacterial pneumonia. SpO2: 97 % O2 Flow Rate (L/min): 2 L/min  02/21/20 Patient being weaned off to RA today, will continue monitoring O2 sats. Continue remdesivir, decadron, and Abx for pna.  02/22/20 Remains on RA with stable O2 sats but has occasional cough with rhonchi. Will continue steroids, discontinue Remdesivir due to bradycardia.   Bradycardia. HR noted to be in low 40s on 1/5 evening. HR remains in the upper 40s to 50s. Will dc remdesivir as this medication has had reported effect of bradycardia. Continue to monitor.   Acute transient hypotension in the setting of sepsis, hypovolemia and poor p.o. intake  - Initial blood pressure systolic in the 81X with MAP of 40, resolved quickly with IV fluids - Continue to follow clinically, blood pressure markedly well controlled this morning continue IV fluids well poor p.o. intake ongoing especially while  n.p.o. in the setting of aspiration.  02/21/20 No longer on IV fluids, will start pureed diet today, after being seen by  SLP with improved mentation.  02/22/20 Tolerating pureed diet without a cough while eating. Per nursing report, she had sip of water today but had cough with it. Will continue thickened liquids and pureed diet with aspiration precautions as recommended per SLP. Per her mother at the beside, the patient's appetite has been poor. Will request dietitian consultation. Of note, the patient and her family prefer no pork, they eat fish, eggs, and dairy.   AKI likely multifactorial: Lisinopril, hypotension, sepsis, hypovolemia. Improving - Creatinine 2.13 at presentation, baseline 0.7 - Hold lisinopril, especially in the setting of transient hypotension - Resolving with IV fluids, creatinine now within normal limits 02/21/20 Scr now at baseline, normal.    Hypertension, essential -currently hypotensive/normotensive -Continue to hold lisinopril given AKI and borderline hypotension  Diabetes, type II, non-insulin-dependent, well controlled - A1c 6.2 - Hold home Metformin - Start insulin per COVID-19 steroid order set, hypoglycemic protocol 02/21/20 Continue current regimen 02/22/20 Has hyperglycemia but in the setting of steroid use. Continue current regimen.   History of developmental delay - Nonverbal at baseline - Continue home Risperdal  02/22/20 Continue to allow mother and caregiver at the bedside.    Discharge Diagnoses:  Principal Problem:   Acute respiratory disease due to COVID-19 virus Active Problems:   Developmental disability   Type 2 diabetes mellitus without complication (HCC)   Hypotension due to hypovolemia   Acute respiratory failure with hypoxia (HCC)   Severe sepsis (HCC)   Aspiration pneumonia (HCC)   AKI (acute kidney injury) [facility])    Discharge Instructions  Discharge Instructions    Diet Carb Modified   Complete by: As directed    Puree with nectar thick liquids with assistance with feedings, Small bites at slow rate Crush meds in puree   Increase activity  slowly   Complete by: As directed    No wound care   Complete by: As directed      Allergies as of 02/23/2020      Reactions   Morphine And Related Shortness Of Breath      Medication List    TAKE these medications   Accu-Chek Guide test strip Generic drug: glucose blood Use to check blood sugar up to 3 times daily. E11.9   Accu-Chek Guide w/Device Kit 1 kit by Does not apply route daily. Use to check blood sugar up to 3 times daily. Patient's meter is broken. Needs Replacement.   acetaminophen 325 MG tablet Commonly known as: TYLENOL Take 2 tablets (650 mg total) by mouth every 6 (six) hours as needed for mild pain or headache (fever >/= 101).   feeding supplement Liqd Take 237 mLs by mouth 2 (two) times daily between meals.   food thickener Powd Commonly known as: THICK IT Use it to thicken liquids   guaiFENesin-dextromethorphan 100-10 MG/5ML syrup Commonly known as: ROBITUSSIN DM Take 10 mLs by mouth every 4 (four) hours as needed for cough.   Lancets Misc 1 kit by Does not apply route daily. Use to check blood sugar up to 3 times daily. E11.9   lisinopril 5 MG tablet Commonly known as: ZESTRIL Take 1 tablet (5 mg total) by mouth daily.   Melatonin 5 MG Chew Chew 5 mg by mouth at bedtime as needed (sleep).   metFORMIN 500 MG tablet Commonly known as: GLUCOPHAGE Take 2 tablets (1,000 mg total) by mouth  2 (two) times daily with a meal.   risperiDONE 3 MG tablet Commonly known as: RISPERDAL TAKE 1/2 TABLET BY MOUTH 2 (TWO) TIMES DAILY. What changed: See the new instructions.       Follow-up Information    Gildardo Pounds, NP. Call in 1 week(s).   Specialty: Nurse Practitioner Contact information: [address] [place] [address] [phone]              Allergies  Allergen Reactions  . Morphine And Related Shortness Of Breath       Procedures/Studies: DG Chest Port 1 View  Result Date: 02/18/2020 CLINICAL DATA:  COVID positive  EXAM: PORTABLE CHEST 1 VIEW COMPARISON:  None. FINDINGS: The heart size and mediastinal contours are within normal limits. Both lungs are clear. The visualized skeletal structures are unremarkable. IMPRESSION: No active disease. Electronically Signed   By: Prudencio Pair M.D.   On: 02/18/2020 02:56       Subjective: Patient sitting up in chair, was able to ambulate without assistance, O2 sats remains stable on RA. Tolerating po intake.   Discharge Exam: Vitals:   02/23/20 0508 02/23/20 1307  BP: 128/79 (!) 140/95  Pulse: (!) 46 (!) 54  Resp: 16 20  Temp: 97.7 F (36.5 C) 98 F (36.7 C)  SpO2: 97% 98%   Vitals:   02/22/20 1613 02/22/20 2032 02/23/20 0508 02/23/20 1307  BP:  108/81 128/79 (!) 140/95  Pulse:  (!) 43 (!) 46 (!) 54  Resp:  _0 Temp:  98 F (36.7 C) 97.7 F (36.5 C) 98 F (36.7 C)  TempSrc:  Oral Axillary   SpO2: 97% 97% 97% 98%  Weight:      Height:        General: Pt is alert, awake, not in acute distress Cardiovascular: S1/S2 +, HR in upper 50s Respiratory: No cough, normal rate Abdominal: Soft, NT, ND Extremities: no edema, no cyanosis    The results of significant diagnostics from this hospitalization (including imaging, microbiology, ancillary and laboratory) are listed below for reference.     Microbiology: Recent Results (from the past 240 hour(s))  Resp Panel by RT-PCR (Flu A&B, Covid) Nasopharyngeal Swab     Status: Abnormal   Collection Time: 02/18/20  2:33 AM   Specimen: Nasopharyngeal Swab; Nasopharyngeal(NP) swabs in vial transport medium  Result Value Ref Range Status   SARS Coronavirus 2 by RT PCR POSITIVE (A) NEGATIVE Final    Comment: RESULT CALLED TO, READ BACK BY AND VERIFIED WITH: POWELL,V AT 0411 ON [id number] BY CHERESNOWSKY,T (NOTE) SARS-CoV-2 target nucleic acids are DETECTED.  The SARS-CoV-2 RNA is generally detectable in upper respiratory specimens during the acute phase of infection. Positive results are indicative of  the presence of the identified virus, but do not rule out bacterial infection or co-infection with other pathogens not detected by the test. Clinical correlation with patient history and other diagnostic information is necessary to determine patient infection status. The expected result is Negative.  Fact Sheet for Patients: EntrepreneurPulse.com.au  Fact Sheet for Healthcare Providers: IncredibleEmployment.be  This test is not yet approved or cleared by the [place] FDA and  has been authorized for detection and/or diagnosis of SARS-CoV-2 by FDA under an Emergency Use Authorization (EUA).  This EUA will remain in effect (meaning this  test can be used) for the duration of  the COVID-19 declaration under Section 564(b)(1) of the Act, 21 U.S.C. section 360bbb-3(b)(1), unless the authorization is terminated or revoked  sooner.     Influenza A by PCR NEGATIVE NEGATIVE Final   Influenza B by PCR NEGATIVE NEGATIVE Final    Comment: (NOTE) The Xpert Xpress SARS-CoV-2/FLU/RSV plus assay is intended as an aid in the diagnosis of influenza from Nasopharyngeal swab specimens and should not be used as a sole basis for treatment. Nasal washings and aspirates are unacceptable for Xpert Xpress SARS-CoV-2/FLU/RSV testing.  Fact Sheet for Patients: EntrepreneurPulse.com.au  Fact Sheet for Healthcare Providers: IncredibleEmployment.be  This test is not yet approved or cleared by the [place] FDA and has been authorized for detection and/or diagnosis of SARS-CoV-2 by FDA under an Emergency Use Authorization (EUA). This EUA will remain in effect (meaning this test can be used) for the duration of the COVID-19 declaration under Section 564(b)(1) of the Act, 21 U.S.C. section 360bbb-3(b)(1), unless the authorization is terminated or revoked.  Performed at [facility], [address] [place],  [place] [address]   Urine culture     Status: None   Collection Time: 02/18/20  2:56 AM   Specimen: In/Out Cath Urine  Result Value Ref Range Status   Specimen Description   Final    IN/OUT CATH URINE Performed at [facility], [address] [place], [place] [address]    Special Requests   Final    NONE Performed at [facility], [address] [place], [place] [address]    Culture   Final    NO GROWTH Performed at [facility], [place] [address], [place], [place] [address]    Report Status 02/19/2020 FINAL  Final  Blood Culture (routine x 2)     Status: None (Preliminary result)   Collection Time: 02/18/20  2:58 AM   Specimen: BLOOD RIGHT FOREARM  Result Value Ref Range Status   Specimen Description   Final    BLOOD RIGHT FOREARM Performed at [facility] [address] [place], [place] [address]    Special Requests   Final    BOTTLES DRAWN AEROBIC AND ANAEROBIC Blood Culture adequate volume Performed at [facility], [address] [place], [place] [address]    Culture   Final    NO GROWTH 4 DAYS Performed at [facility], [place] [address] [place], [place] [address]    Report Status PENDING  Incomplete  Blood Culture (routine x 2)     Status: None (Preliminary result)   Collection Time: 02/18/20  2:58 AM   Specimen: BLOOD LEFT ARM  Result Value Ref Range Status   Specimen Description   Final    BLOOD LEFT ARM Performed at [facility], [address] [place], [place] [address]    Special Requests   Final    BOTTLES DRAWN AEROBIC AND ANAEROBIC Blood Culture adequate volume Performed at [facility], [address] [place], [place] [address]    Culture   Final    NO GROWTH 4 DAYS Performed at [facility], [place] [address], [place], [place] [address]    Report Status PENDING  Incomplete  MRSA PCR Screening     Status: None   Collection Time: 02/19/20  9:29 AM   Specimen: Nasal Mucosa;  Nasopharyngeal  Result Value Ref Range Status   MRSA by PCR NEGATIVE NEGATIVE Final    Comment:        The GeneXpert MRSA Assay (FDA approved for NASAL  specimens only), is one component of a comprehensive MRSA colonization surveillance program. It is not intended to diagnose MRSA infection nor to guide or monitor treatment for MRSA infections. Performed at [facility], [place] [address] [place], [place] [address]   Aerobic Culture (superficial specimen)     Status: None   Collection Time: 02/19/20  5:04 PM   Specimen: Vaginal  Result Value Ref Range Status   Specimen Description   Final    VAGINA Performed at [place] [address] [place], [place] [address]    Special Requests   Final    NONE Performed at [facility], [place] [address], [place], [place] [address]    Gram Stain NO WBC SEEN FEW GRAM POSITIVE RODS   Final   Culture   Final    RARE NORMAL VAGINAL FLORA NO GROUP B STREP (S.AGALACTIAE) ISOLATED NO STAPHYLOCOCCUS AUREUS ISOLATED Performed at [facility], [address], [place], [place] [address]    Report Status 02/22/2020 FINAL  Final     Labs: BNP (last 3 results) No results for input(s): BNP in the last 8760 hours. Basic Metabolic Panel: Recent Labs  Lab 02/20/20 0259 02/21/20 0544 02/22/20 0511 02/23/20 0445 02/23/20 1203  NA 141 144 146* 151* 146*  K 4.5 4.1 3.8 3.4* 3.1*  CL 106 110 110 116* 111  CO2 20* 20* _0 GLUCOSE 140* 158* 174* 75 211*  BUN 15 23* 32* 30* 27*  CREATININE 0.68 0.83 0.87 0.85 0.87  CALCIUM 8.0* 8.1* 8.4* 8.5* 8.4*  MG 1.7 2.0 2.3 2.3  --    Liver Function Tests: Recent Labs  Lab 02/19/20 1018 02/20/20 0259 02/21/20 0544 02/22/20 0511 02/23/20 0445  AST _1 65* 49*  ALT _2 52* 57*  ALKPHOS 24* 26* 28* 28* 27*  BILITOT 0.5 0.3 0.5 0.2* 0.5  PROT 6.1* 6.3* 6.2* 5.9* 5.6*  ALBUMIN 3.0* 3.1* 3.1* 2.9* 2.9*   No results for  input(s): LIPASE, AMYLASE in the last 168 hours. No results for input(s): AMMONIA in the last 168 hours. CBC: Recent Labs  Lab 02/19/20 1018 02/20/20 0259 02/21/20 0544 02/22/20 0511 02/23/20 0445  WBC 3.3* 2.5* 4.4 3.2* 3.7*  NEUTROABS 2.2 1.4* 3.1 2.3 2.1  HGB 10.4* 10.1* 11.1* 10.5* 10.7*  HCT 31.6* 31.0* 35.5* 32.8* 33.6*  MCV 93.5 94.2 96.7 96.2 96.6  PLT 175 PLATELET CLUMPS NOTED ON SMEAR, COUNT APPEARS DECREASED 257 286 315   Cardiac Enzymes: No results for input(s): CKTOTAL, CKMB, CKMBINDEX, TROPONINI in the last 168 hours. BNP: Invalid input(s): POCBNP CBG: Recent Labs  Lab 02/22/20 1227 02/22/20 1804 02/22/20 2033 02/23/20 0831 02/23/20 1106  GLUCAP 249* 206* 156* 98 198*   D-Dimer Recent Labs    02/22/20 0511 02/23/20 0445  DDIMER 0.50 0.65*   Hgb A1c No results for input(s): HGBA1C in the last 72 hours. Lipid Profile No results for input(s): CHOL, HDL, LDLCALC, TRIG, CHOLHDL, LDLDIRECT in the last 72 hours. Thyroid function studies Recent Labs    02/22/20 0511  TSH 1.717   Anemia work up Recent Labs    02/22/20 0511 02/23/20 0445  FERRITIN 224 182   Urinalysis    Component Value Date/Time   COLORURINE YELLOW 02/18/2020 0256   APPEARANCEUR HAZY (A) 02/18/2020 0256   LABSPEC 1.025 02/18/2020 0256   PHURINE 5.0 02/18/2020 0256   GLUCOSEU NEGATIVE 02/18/2020 0256   HGBUR NEGATIVE 02/18/2020 0256  BILIRUBINUR MODERATE (A) 02/18/2020 0256   BILIRUBINUR negative 10/31/2018 1005   KETONESUR 40 (A) 02/18/2020 0256   PROTEINUR 30 (A) 02/18/2020 0256   UROBILINOGEN 0.2 10/31/2018 1005   UROBILINOGEN 0.2 12/18/2014 1932   NITRITE NEGATIVE 02/18/2020 0256   LEUKOCYTESUR NEGATIVE 02/18/2020 0256   Sepsis Labs Invalid input(s): PROCALCITONIN,  WBC,  LACTICIDVEN Microbiology Recent Results (from the past 240 hour(s))  Resp Panel by RT-PCR (Flu A&B, Covid) Nasopharyngeal Swab     Status: Abnormal   Collection Time: 02/18/20  2:33 AM    Specimen: Nasopharyngeal Swab; Nasopharyngeal(NP) swabs in vial transport medium  Result Value Ref Range Status   SARS Coronavirus 2 by RT PCR POSITIVE (A) NEGATIVE Final    Comment: RESULT CALLED TO, READ BACK BY AND VERIFIED WITH: POWELL,V AT 0411 ON [id number] BY CHERESNOWSKY,T (NOTE) SARS-CoV-2 target nucleic acids are DETECTED.  The SARS-CoV-2 RNA is generally detectable in upper respiratory specimens during the acute phase of infection. Positive results are indicative of the presence of the identified virus, but do not rule out bacterial infection or co-infection with other pathogens not detected by the test. Clinical correlation with patient history and other diagnostic information is necessary to determine patient infection status. The expected result is Negative.  Fact Sheet for Patients: EntrepreneurPulse.com.au  Fact Sheet for Healthcare Providers: IncredibleEmployment.be  This test is not yet approved or cleared by the [place] FDA and  has been authorized for detection and/or diagnosis of SARS-CoV-2 by FDA under an Emergency Use Authorization (EUA).  This EUA will remain in effect (meaning this  test can be used) for the duration of  the COVID-19 declaration under Section 564(b)(1) of the Act, 21 U.S.C. section 360bbb-3(b)(1), unless the authorization is terminated or revoked sooner.     Influenza A by PCR NEGATIVE NEGATIVE Final   Influenza B by PCR NEGATIVE NEGATIVE Final    Comment: (NOTE) The Xpert Xpress SARS-CoV-2/FLU/RSV plus assay is intended as an aid in the diagnosis of influenza from Nasopharyngeal swab specimens and should not be used as a sole basis for treatment. Nasal washings and aspirates are unacceptable for Xpert Xpress SARS-CoV-2/FLU/RSV testing.  Fact Sheet for Patients: EntrepreneurPulse.com.au  Fact Sheet for Healthcare Providers: IncredibleEmployment.be  This test  is not yet approved or cleared by the [place] FDA and has been authorized for detection and/or diagnosis of SARS-CoV-2 by FDA under an Emergency Use Authorization (EUA). This EUA will remain in effect (meaning this test can be used) for the duration of the COVID-19 declaration under Section 564(b)(1) of the Act, 21 U.S.C. section 360bbb-3(b)(1), unless the authorization is terminated or revoked.  Performed at [facility], [address] [place], [place] [address]   Urine culture     Status: None   Collection Time: 02/18/20  2:56 AM   Specimen: In/Out Cath Urine  Result Value Ref Range Status   Specimen Description   Final    IN/OUT CATH URINE Performed at [facility], [address] [place], [place] [address]    Special Requests   Final    NONE Performed at [facility], [address] [place], [place] [address]    Culture   Final    NO GROWTH Performed at [facility], [place] [address] [place],  [address]    Report Status 02/19/2020 FINAL  Final  Blood Culture (routine x 2)     Status: None (Preliminary result)   Collection Time:  02/18/20  2:58 AM   Specimen: BLOOD RIGHT FOREARM  Result Value Ref Range Status   Specimen Description   Final    BLOOD RIGHT FOREARM Performed at [facility], [address] [place], [place] [address]    Special Requests   Final    BOTTLES DRAWN AEROBIC AND ANAEROBIC Blood Culture adequate volume Performed at [facility], [address] [place], [place] [address]    Culture   Final    NO GROWTH 4 DAYS Performed at [facility], [place] [address] [place], [place] [address]    Report Status PENDING  Incomplete  Blood Culture (routine x 2)     Status: None (Preliminary result)   Collection Time: 02/18/20  2:58 AM   Specimen: BLOOD LEFT ARM  Result Value Ref Range Status   Specimen Description   Final    BLOOD LEFT ARM Performed at [facility],  [address] [place], [place] [address]    Special Requests   Final    BOTTLES DRAWN AEROBIC AND ANAEROBIC Blood Culture adequate volume Performed at [facility], [address] [place], [place] [address]    Culture   Final    NO GROWTH 4 DAYS Performed at [facility], [place] [address], [place], [place] [address]    Report Status PENDING  Incomplete  MRSA PCR Screening     Status: None   Collection Time: 02/19/20  9:29 AM   Specimen: Nasal Mucosa; Nasopharyngeal  Result Value Ref Range Status   MRSA by PCR NEGATIVE NEGATIVE Final    Comment:        The GeneXpert MRSA Assay (FDA approved for NASAL specimens only), is one component of a comprehensive MRSA colonization surveillance program. It is not intended to diagnose MRSA infection nor to guide or monitor treatment for MRSA infections. Performed at [facility], [place] [address] [place], [place] [address]   Aerobic Culture (superficial specimen)     Status: None   Collection Time: 02/19/20  5:04 PM   Specimen: Vaginal  Result Value Ref Range Status   Specimen Description   Final    VAGINA Performed at [place] [address], [place], [place] [address]    Special Requests   Final    NONE Performed at [facility], [place] [address] [place], [place] [address]    Gram Stain NO WBC SEEN FEW GRAM POSITIVE RODS   Final   Culture   Final    RARE NORMAL VAGINAL FLORA NO GROUP B STREP (S.AGALACTIAE) ISOLATED NO STAPHYLOCOCCUS AUREUS ISOLATED Performed at [facility], [address], [place], [place] [address]    Report Status 02/22/2020 FINAL  Final     Time coordinating discharge: Over 33 minutes  SIGNED:   Blain Pais, MD  [facility] 02/23/2020, 2:03 PM Pager   If 7PM-7AM, please contact night-coverage [email or web address] Password [email or web address]

## 2020-02-23 NOTE — Evaluation (Signed)
Physical Therapy Evaluation Patient Details Name: Makayla Roberts MRN: [id number] DOB: 1985-06-16 Today's Date: 02/23/2020   History of Present Illness  This is a 35 year old female with  past medical history of developmental delay, left eye blindness, nonverbal, HFpEF, diabetes who presented to [facility] 02/18/20 with a fever, associated with chills and shortness of breath, diarrhea, decreased appetite and vomiting. Patient tested positive for Covid/  Clinical Impression  Mother present. Interpreter utilizedThe patient is able to mobilize with tactile cues. Assisted to sitting on bed edge. Stood with 2  HHA and ambulated in room. SPO2 96%. Per mother, hopeful to Dc home today. Pt admitted with above diagnosis.  Pt currently with functional limitations due to the deficits listed below (see PT Problem List). Pt will benefit from skilled PT to increase their independence and safety with mobility to allow discharge to the venue listed below.     Follow Up Recommendations No PT follow up    Equipment Recommendations  None recommended by PT    Recommendations for Other Services       Precautions / Restrictions Precautions Precautions: Fall      Mobility  Bed Mobility Overal bed mobility: Needs Assistance Bed Mobility: Supine to Sit     Supine to sit: Min guard;Min assist     General bed mobility comments: multimodal cues to initiate moving legs, sitting upright    Transfers Overall transfer level: Needs assistance Equipment used: 2 person hand held assist Transfers: Sit to/from Stand Sit to Stand: Min assist         General transfer comment: mother assisted with 1 HH anf 1 HH by therapist.  Ambulation/Gait Ambulation/Gait assistance: Min assist Gait Distance (Feet): 30 Feet Assistive device: 2 person hand held assist Gait Pattern/deviations: Step-through pattern;Staggering right;Staggering left Gait velocity: decr   General Gait Details: Steady assist, less as ambulation  distance increased.  Stairs            Wheelchair Mobility    Modified Rankin (Stroke Patients Only)       Balance Overall balance assessment: Needs assistance Sitting-balance support: No upper extremity supported;Feet supported Sitting balance-Leahy Scale: Fair Sitting balance - Comments: close supervision   Standing balance support: Bilateral upper extremity supported Standing balance-Leahy Scale: Poor Standing balance comment: needs assistance to stabilize                             Pertinent Vitals/Pain Faces Pain Scale: No hurt    Home Living Family/patient expects to be discharged to:: Private residence Living Arrangements: Parent Available Help at Discharge: Family;Available 24 hours/day Type of Home: House Home Access: Stairs to enter   [employer] of Steps: 2 Home Layout: One level Home Equipment: None      Prior Function Level of Independence: Needs assistance   Gait / Transfers Assistance Needed: independent without AD, Attends school 5 d/ wk,.rides bus.  ADL's / Homemaking Assistance Needed: mother assists with dressig PRN        Hand Dominance   Dominant Hand: Left    Extremity/Trunk Assessment        Lower Extremity Assessment Lower Extremity Assessment: Generalized weakness    Cervical / Trunk Assessment Cervical / Trunk Assessment: Normal  Communication   Communication: Interpreter utilized;Expressive difficulties  Cognition Arousal/Alertness: Awake/alert Behavior During Therapy: WFL for tasks assessed/performed Overall Cognitive Status: History of cognitive impairments - at baseline  General Comments: used interpreter services for communication with mother, patient follows with jesters/tactile inconsistently.      General Comments      Exercises     Assessment/Plan    PT Assessment Patient needs continued PT services  PT Problem List Decreased  strength;Decreased balance;Decreased cognition;Decreased knowledge of precautions;Decreased mobility;Decreased activity tolerance;Decreased safety awareness       PT Treatment Interventions DME instruction;Therapeutic activities;Gait training;Patient/family education;Functional mobility training    PT Goals (Current goals can be found in the Care Plan section)  Acute Rehab PT Goals Patient Stated Goal: go home today PT Goal Formulation: With patient/family Time For Goal Achievement: 03/08/20 Potential to Achieve Goals: Good    Frequency Min 3X/week   Barriers to discharge        Co-evaluation PT/OT/SLP Co-Evaluation/Treatment: Yes Reason for Co-Treatment: For patient/therapist safety;To address functional/ADL transfers PT goals addressed during session: Mobility/safety with mobility OT goals addressed during session: ADL's and self-care       AM-PAC PT "6 Clicks" Mobility  Outcome Measure Help needed turning from your back to your side while in a flat bed without using bedrails?: A Little Help needed moving from lying on your back to sitting on the side of a flat bed without using bedrails?: A Little Help needed moving to and from a bed to a chair (including a wheelchair)?: A Little Help needed standing up from a chair using your arms (e.g., wheelchair or bedside chair)?: A Little Help needed to walk in hospital room?: A Little Help needed climbing 3-5 steps with a railing? : A Lot 6 Click Score: 17    End of Session Equipment Utilized During Treatment: Gait belt Activity Tolerance: Patient tolerated treatment well Patient left: in chair;with call bell/phone within reach;with chair alarm set;with family/visitor present Nurse Communication: Mobility status PT Visit Diagnosis: Unsteadiness on feet (R26.81);Difficulty in walking, not elsewhere classified (R26.2)    Time: 2353-6144 PT Time Calculation (min) (ACUTE ONLY): 36 min   Charges:   PT Evaluation $PT Eval Low  Complexity: 1 Low          Blanchard Kelch PT Acute Rehabilitation Services Pager [phone] Office [phone]   Rada Hay 02/23/2020, 10:20 AM

## 2020-02-24 LAB — CULTURE, BLOOD (ROUTINE X 2)
Culture: NO GROWTH
Culture: NO GROWTH
Special Requests: ADEQUATE
Special Requests: ADEQUATE

## 2020-02-26 ENCOUNTER — Telehealth: Payer: Self-pay

## 2020-02-26 NOTE — Telephone Encounter (Signed)
Transition Care Management Unsuccessful Follow-up Telephone Call  Date of discharge and from where:  02/23/2020, [facility]   Attempts:  1st Attempt  Reason for unsuccessful TCM follow-up call:  Left voice messages on # [phone] and [phone] requesting a call back to this CM. Calls were placed with the assistance of Dispensing optician.  Need to schedule a virtual follow up appointment at [facility]

## 2020-02-27 ENCOUNTER — Telehealth: Payer: Self-pay

## 2020-02-27 NOTE — Telephone Encounter (Signed)
Transition Care Management Follow-up Telephone Call  Call started with Arabic interpreter # [id number]/[employer].  Patient's sister in law, Dickie La answered. She speaks [place] and completed the call.     Date of discharge and from where: 02/23/2020, [facility]  How have you been since you were released from the hospital? Sophia stated that the patient is doing okay, coughing a lot but to be expected as she was told. She is eating better and is " more like herself."   Any questions or concerns? No   Sophia said that they were instructed to have the patient remain in isolation for 3 days after discharge.   Items Reviewed:  Did the pt receive and understand the discharge instructions provided? Yes   Medications obtained and verified? Yes  - Sophia said that she has all medications and the patient's mother oversees the medication regime.  Sophia did not have any questions about meds at this time  Other? No   Any new allergies since your discharge? No   Dietary orders reviewed? Sophia said that the patient has been eating better. They are adding the thick it to liquids/soups and the patient has been tolerating that without any problems.  She has been drinking Ensure twice/day and has also been tolerating a puree diet.   Do you have support at home? Yes  - her family,  She is never left alone.   Home Care and Equipment/Supplies: Were home health services ordered? no If so, what is the name of the agency? n/a  Has the agency set up a time to come to the patient's home? n/a Were any new equipment or medical supplies ordered?  No What is the name of the medical supply agency? n/a Were you able to get the supplies/equipment? n/a Do you have any questions related to the use of the equipment or supplies? No, n/a  Functional Questionnaire: (I = Independent and D = Dependent) ADLs: family provides assistance with medication management and dressing, otherwise, patient is  independent. No assistive device needed with ambulation.   Follow up appointments reviewed:   PCP Hospital f/u appt confirmed? Yes  - Georgian Co, PA 03/20/2020 - virtual visit  Specialist Hospital f/u appt confirmed? Yes  - neurology - 04/25/2020.   Are transportation arrangements needed? not addressed  If their condition worsens, is the pt aware to call PCP or go to the Emergency Dept.? Yes  Was the patient provided with contact information for the PCP's office or ED? she has the phone number for [facility]  Was to pt encouraged to call back with questions or concerns?  yes

## 2020-03-03 NOTE — Telephone Encounter (Signed)
NOTED

## 2020-03-06 ENCOUNTER — Other Ambulatory Visit: Payer: Self-pay | Admitting: Pharmacist

## 2020-03-06 MED ORDER — TRUEPLUS LANCETS 28G MISC: 2 refills | Status: DC

## 2020-03-06 MED ORDER — TRUE METRIX METER W/DEVICE KIT: PACK | 0 refills | Status: AC

## 2020-03-06 MED ORDER — TRUE METRIX BLOOD GLUCOSE TEST VI STRP: ORAL_STRIP | 2 refills | Status: DC

## 2020-03-06 MED FILL — METFORMIN HCL 500 MG TABS: 500 | 30 days supply | Qty: 120 | Fill #1

## 2020-03-06 MED FILL — risperiDONE 3 MG TABS: 3 | 30 days supply | Qty: 30 | Fill #4

## 2020-03-06 MED FILL — !TRUE METRIX BLOOD GLUCOSE: 30 days supply | Qty: 1 | Fill #0

## 2020-03-06 MED FILL — TRUE METRIX GLUCOSE TEST ST: 33 days supply | Qty: 100 | Fill #0

## 2020-03-06 MED FILL — TRUEplus LANCETS 28G MISC: 33 days supply | Qty: 100 | Fill #0

## 2020-03-08 ENCOUNTER — Telehealth: Payer: Self-pay | Admitting: *Deleted

## 2020-03-08 NOTE — Telephone Encounter (Signed)
Our office received a PA request for risperidone 3mg . This medication is on the current formulary for [place] [employer] as a preferred drug.   I called [facility]/Medicaid [phone]) and spoke to [place]. She confirmed the drug is preferred and that [place] is a verified provider with them. She was receiving an error message that the pharmacy had been suspended and that they needed to call to update their address.  I called [facility] pharmacy and spoke to Bie[place]. He said they were still getting a rejection but the patient's family paid cash and had been doing so for a while now ($10 for the prescription). Our call was cut off. I called back and spoke to [place]. Provided her with the Medicaid call reference [place]. She will contact them to work on the suspension.  No further action is required by our office.

## 2020-03-20 ENCOUNTER — Encounter: Payer: Self-pay | Admitting: Physician Assistant

## 2020-03-20 ENCOUNTER — Other Ambulatory Visit: Payer: Self-pay

## 2020-03-20 ENCOUNTER — Other Ambulatory Visit: Payer: Self-pay | Admitting: Physician Assistant

## 2020-03-20 ENCOUNTER — Ambulatory Visit: Payer: Medicaid Other | Attending: Physician Assistant | Admitting: Physician Assistant

## 2020-03-20 DIAGNOSIS — Z09 Encounter for follow-up examination after completed treatment for conditions other than malignant neoplasm: Secondary | ICD-10-CM

## 2020-03-20 DIAGNOSIS — Z789 Other specified health status: Secondary | ICD-10-CM | POA: Diagnosis not present

## 2020-03-20 DIAGNOSIS — E119 Type 2 diabetes mellitus without complications: Secondary | ICD-10-CM

## 2020-03-20 DIAGNOSIS — I10 Essential (primary) hypertension: Secondary | ICD-10-CM | POA: Diagnosis not present

## 2020-03-20 DIAGNOSIS — Z8616 Personal history of COVID-19: Secondary | ICD-10-CM | POA: Diagnosis not present

## 2020-03-20 DIAGNOSIS — U071 COVID-19: Secondary | ICD-10-CM

## 2020-03-20 MED ORDER — RISPERIDONE 3 MG PO TABS: 1.5000 mg | ORAL_TABLET | Freq: Two times a day (BID) | ORAL | 3 refills | Status: DC

## 2020-03-20 MED ORDER — METFORMIN HCL 500 MG PO TABS: 1000.0000 mg | ORAL_TABLET | Freq: Two times a day (BID) | ORAL | 2 refills | Status: DC

## 2020-03-20 MED ORDER — LISINOPRIL 5 MG PO TABS: 5.0000 mg | ORAL_TABLET | Freq: Every day | ORAL | 1 refills | Status: DC

## 2020-03-20 NOTE — Progress Notes (Signed)
Patient ID: Makayla Roberts, female   DOB: [birth year], 35 y.o.   MRN: [id number]   Virtual Visit via Telephone Note  I connected with Makayla Roberts on 03/20/20 at  2:30 PM EST by telephone and verified that I am speaking with the correct person using two identifiers.  Location: Patient: home Provider: [facility] : [facility] office Interpreter Yasmin   I discussed the limitations, risks, security and privacy concerns of performing an evaluation and management service by telephone and the availability of in person appointments. I also discussed with the patient that there may be a patient responsible charge related to this service. The patient expressed understanding and agreed to proceed.   History of Present Illness: After hospitalization 1/2-02/23/2020.  She is doing much better.  Appetite is good.  Drinking well.  No SOB.  No fever.  Back on her meds.  Her mom is providing the history.  The patient is ready to go back to school and they need a note to [employer]. Her mom says she is back to baseline  Brief/Interim Summary: She is more interactive today, was able to ambulate without assistance which is her baseline. Her cough is improving. She is tolerating a pureed diet with nectar thick liquids. Her O2 sats remains stable on RA.  Her sodium level was up overnight but repeat labs showed improved after she had gentle IV fluids for hydration.  Her mother was at the bedside with her daughter-in-law interpreting on the phone as patient's mother declined [facility] interpreter.  The patient completed treatment for COVID with remdesivir except for the last dose due to bradycardia thought to be secondary to the effect of this medication. Her HR trended up from 40s to upper 50s today.  Steroids will not be continued as she is no longer hypoxic.  She completed antibiotic treatment for pneumonia and will need close monitoring by per PCP to decide if she should continue po antibiotics in the outpatient setting. She will need to continue  a pureed diet with nectar thick liquids, this was discussed with her mother. She needs to continue aspiration precautions at home.  She may have a lingering cough due to COVID but will need continued monitoring by her PCP as she recovers, this was discussed with her family.   Per SLP, recommend possible outpatient modified barium swallow evaluation and GI referral per PCP.   Please see below for further details of this hospital course prior to today:   Severe sepsis without septic shock Acute hypoxic respiratory failure secondary to likely acute on chronic aspiration pneumonia Likely concurrent incidental COVID infection - Sepsis criteria met at admission: Febrile, tachycardic, tachypneic, leukopenic - Patient has worsening acute on chronic aspiration per mother at bedside -likely in the setting of comorbid conditions mental status baseline -have asked speech to evaluate and educate the mother on ways to feed her daughter more safely. - Procalcitonin elevated above 1 continue ceftriaxone and azithromycin, chest x-ray personally reviewed with questionable right middle lobe opacifications and air bronchograms concerning for aspiration - Less likely acute COVID-19 pneumonia given mild hypoxia, minimally elevated inflammatory markers with other clear etiology for acute hypoxia -we will continue Remdesivir and steroids for 5 and 10 days respectively, low threshold to discontinue Remdesivir early. Patient does not meet criteria for baricitinib given likely active ongoing bacterial pneumonia. SpO2: 97 % O2 Flow Rate (L/min): 2 L/min 02/21/20 Patient being weaned off to RA today, will continue monitoring O2 sats. Continue remdesivir, decadron, and Abx for pna. 02/22/20 Remains on RA with  stable O2 sats but has occasional cough with rhonchi. Will continue steroids, discontinue Remdesivir due to bradycardia.   Bradycardia. HR noted to be in low 40s on 1/5 evening. HR remains in the upper 40s to 50s.  Will dc remdesivir as this medication has had reported effect of bradycardia. Continue to monitor.  Acute transient hypotension in the setting of sepsis, hypovolemia and poor p.o. intake  - Initial blood pressure systolic in the 07P with MAP of 40, resolved quickly with IV fluids - Continue to follow clinically, blood pressure markedly well controlled this morning continue IV fluids well poor p.o. intake ongoing especially while n.p.o. in the setting of aspiration.  02/21/20 No longer on IV fluids, will start pureed diet today, after being seen by SLP with improved mentation. 02/22/20 Tolerating pureed diet without a cough while eating. Per nursing report, she had sip of water today but had cough with it. Will continue thickened liquids and pureed diet with aspiration precautions as recommended per SLP. Per her mother at the beside, the patient's appetite has been poor. Will request dietitian consultation. Of note, the patient and her family prefer no pork, they eat fish, eggs, and dairy.  AKI likely multifactorial: Lisinopril, hypotension, sepsis, hypovolemia. Improving - Creatinine 2.13 at presentation, baseline 0.7 - Hold lisinopril, especially in the setting of transient hypotension - Resolving with IV fluids, creatinine now within normal limits 02/21/20 Scr now at baseline, normal.    Hypertension, essential -currently hypotensive/normotensive -Continue to hold lisinopril given AKI and borderline hypotension  Diabetes, type II, non-insulin-dependent, well controlled - A1c 6.2 - Hold home Metformin - Start insulin per COVID-19 steroid order set, hypoglycemic protocol 02/21/20 Continue current regimen 02/22/20 Has hyperglycemia but in the setting of steroid use. Continue current regimen.  History of developmental delay - Nonverbal at baseline - Continue home Risperdal 02/22/20 Continue to allow mother and caregiver at the bedside.   Discharge Diagnoses:  Principal Problem:    Acute respiratory disease due to COVID-19 virus Active Problems:   Developmental disability   Type 2 diabetes mellitus without complication (HCC)   Hypotension due to hypovolemia   Acute respiratory failure with hypoxia (HCC)   Severe sepsis (HCC)   Aspiration pneumonia (HCC)   AKI (acute kidney injury) ([place])    Observations/Objective: Per mom-patient back to baseline  Assessment and Plan: 1. Type 2 diabetes mellitus without complication, without long-term current use of insulin (HCC) Last A1c=6.2-continue current regimen - metFORMIN (GLUCOPHAGE) 500 MG tablet; Take 2 tablets (1,000 mg total) by mouth 2 (two) times daily with a meal.  Dispense: 120 tablet; Refill: 2 - lisinopril (ZESTRIL) 5 MG tablet; Take 1 tablet (5 mg total) by mouth daily.  Dispense: 90 tablet; Refill: 1  2. Essential hypertension stable - lisinopril (ZESTRIL) 5 MG tablet; Take 1 tablet (5 mg total) by mouth daily.  Dispense: 90 tablet; Refill: 1  3. Language barrier [employer] used and additional time performing visit was required.   4. COVID Resolved-note done for back to school 03/25/2020 and put up fromt and one mailed  5. Hospital discharge follow-up Back to baesline per mom  Follow Up Instructions: See PCP in 2-3 months   I discussed the assessment and treatment plan with the patient. The patient was provided an opportunity to ask questions and all were answered. The patient agreed with the plan and demonstrated an understanding of the instructions.   The patient was advised to call back or seek an in-person evaluation if the symptoms worsen or  if the condition fails to improve as anticipated.  I provided 17 minutes of non-face-to-face time during this encounter.   Freeman Caldron, PA-C

## 2020-03-21 ENCOUNTER — Other Ambulatory Visit: Payer: Medicaid Other

## 2020-03-21 MED FILL — risperiDONE 3 MG TABS: 3 | 30 days supply | Qty: 30 | Fill #5

## 2020-03-25 MED FILL — LISINOPRIL 5 MG TABLET: 5 | 90 days supply | Qty: 90 | Fill #0

## 2020-04-01 ENCOUNTER — Other Ambulatory Visit: Payer: Self-pay | Admitting: Ophthalmology

## 2020-04-01 MED FILL — METFORMIN HCL 500 MG TABS: 500 | 30 days supply | Qty: 120 | Fill #2

## 2020-04-01 MED FILL — ACCU-CHEK GUIDE TEST STRIP: 30 days supply | Qty: 100 | Fill #1

## 2020-04-01 MED FILL — ACCU-CHEK SOFTCLIX LANCETS: 30 days supply | Qty: 100 | Fill #1

## 2020-04-15 MED FILL — risperiDONE 3 MG TABS: 3 | 30 days supply | Qty: 30 | Fill #0

## 2020-04-25 ENCOUNTER — Ambulatory Visit: Payer: Medicaid Other | Admitting: Adult Health

## 2020-04-25 ENCOUNTER — Other Ambulatory Visit: Payer: Self-pay

## 2020-04-25 VITALS — BP 112/79 | HR 123 | Ht <= 58 in | Wt 130.0 lb

## 2020-04-25 DIAGNOSIS — F89 Unspecified disorder of psychological development: Secondary | ICD-10-CM | POA: Diagnosis not present

## 2020-04-25 NOTE — Progress Notes (Signed)
PATIENT: Makayla Roberts DOB: [birth year]  REASON FOR VISIT: follow up HISTORY FROM: patient  HISTORY OF PRESENT ILLNESS: Today 04/25/20:  Makayla Roberts is a 35 year old female with a history of developmental delay with behavioral issues.  She returns today for follow-up.  She is here today with her mother.  Mother reports that Risperdal half a tablet twice a day continues to work well for her behavior.  She denies any new issues.  She returns today for an evaluation.  10/26/19 Makayla Roberts is a 35 year old female with a history of developmental delay with behavioral issues.  She returns today for follow-up.  She is here today with her sister-in-law.  At the last visit the sister-in-law requested referral to physical and Occupational Therapy as well as genetic testing.  Physical and Occupational Therapy did not feel that they had anything in addition to offer after reading the notes.  I will have the referral coordinator follow-up on genetic testing as this has not been done and they are still interested.  Overall she feels that the patient is doing well.  She is going to an adult day center now.  Denies any significant behavior issues.  Remains on Risperdal  HISTORY 04/19/19:  Makayla Roberts is a 35 year old female with a history of developmental delay with behavioral issues.  She returns today for follow-up.  She is here today with her mother and sister-in-law.  The sister-in-law does not feel that the patient has any behavioral issues.  She also has some concerns that the patient does not have a formal diagnosis.  She states that she has a pediatric nurse friend that she has been discussing the patient with.  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  She is requesting further testing to confirm or deny this diagnosis.  She also feels that the patient could benefit from physical therapy and Occupational Therapy.  They do have a report that there is been no change in her gait or balance since the  last visit.  The sister-in-law also feels that the patient is not "deaf."  She would like a referral to audiology to have formal hearing testing.  REVIEW OF SYSTEMS: Out of a complete 14 system review of symptoms, the patient complains only of the following symptoms, and all other reviewed systems are negative.  See HPI  ALLERGIES: Allergies  Allergen Reactions  . Morphine And Related Shortness Of Breath    HOME MEDICATIONS: Outpatient Medications Prior to Visit  Medication Sig Dispense Refill  . acetaminophen (TYLENOL) 325 MG tablet Take 2 tablets (650 mg total) by mouth every 6 (six) hours as needed for mild pain or headache (fever >/= 101).    . Blood Glucose Monitoring Suppl (TRUE METRIX METER) w/Device KIT Use to check blood sugar TID. E11.9 1 kit 0  . feeding supplement (ENSURE ENLIVE / ENSURE PLUS) LIQD Take 237 mLs by mouth 2 (two) times daily between meals. 237 mL 12  . food thickener (THICK IT) POWD Use it to thicken liquids 284 g 0  . glucose blood (TRUE METRIX BLOOD GLUCOSE TEST) test strip Use to check blood sugar TID. E11.9 100 each 2  . guaiFENesin-dextromethorphan (ROBITUSSIN DM) 100-10 MG/5ML syrup Take 10 mLs by mouth every 4 (four) hours as needed for cough. 118 mL 0  . lisinopril (ZESTRIL) 5 MG tablet Take 1 tablet (5 mg total) by mouth daily. 90 tablet 1  . Melatonin 5 MG CHEW Chew 5 mg by mouth at bedtime as needed (sleep).    Marland Kitchen  metFORMIN (GLUCOPHAGE) 500 MG tablet Take 2 tablets (1,000 mg total) by mouth 2 (two) times daily with a meal. 120 tablet 2  . risperiDONE (RISPERDAL) 3 MG tablet Take 0.5 tablets (1.5 mg total) by mouth 2 (two) times daily. 60 tablet 3  . TRUEplus Lancets 28G MISC Use to check blood sugar TID. E11.9 100 each 2   No facility-administered medications prior to visit.    PAST MEDICAL HISTORY: Past Medical History:  Diagnosis Date  . Blind left eye   . Deaf   . Developmental delay   . Diabetes mellitus without complication ([place])   .  Mental retardation     PAST SURGICAL HISTORY: Past Surgical History:  Procedure Laterality Date  . None      FAMILY HISTORY: Family History  Problem Relation Age of Onset  . Hypertension Mother   . Arthritis Mother   . Heart disease Mother   . Diabetes Father   . Hypertension Father     SOCIAL HISTORY: Social History   Socioeconomic History  . Marital status: Single    Spouse name: Not on file  . Number of children: Not on file  . Years of education: Not on file  . Highest education level: Not on file  Occupational History  . Not on file  Tobacco Use  . Smoking status: Never Smoker  . Smokeless tobacco: Never Used  Vaping Use  . Vaping Use: Never used  Substance and Sexual Activity  . Alcohol use: No  . Drug use: No  . Sexual activity: Never  Other Topics Concern  . Not on file  Social History Narrative   ** Merged History Encounter **       Patient lives at home with her mom (Makayla Roberts). Education- None Left handed. Caffeine- Some hot tea  Patient Special Coordinator Terrilee Croak - [phone] -cell, [phone] and fax [phone].   Social Determinants of Health   Financial Resource Strain: Not on file  Food Insecurity: Not on file  Transportation Needs: Not on file  Physical Activity: Not on file  Stress: Not on file  Social Connections: Not on file  Intimate Partner Violence: Not on file      PHYSICAL EXAM  Vitals:   04/25/20 1406  BP: 112/79  Pulse: (!) 123  Weight: 130 lb (59 kg)  Height: 4' 10" (1.473 m)   Body mass index is 27.17 kg/m.  Generalized: Well developed, in no acute distress   Neurological examination  Mentation: Alert.  Nonverbal Cranial nerve II-XII: Facial symmetry noted Motor: Good strength throughout Sensory: UTA Coordination: UTA Gait and station: feet turned out when ambulating Reflexes: UTA   DIAGNOSTIC DATA (LABS, IMAGING, TESTING) - I reviewed patient records, labs, notes, testing and imaging  myself where available.  Lab Results  Component Value Date   WBC 3.7 (L) 02/23/2020   HGB 10.7 (L) 02/23/2020   HCT 33.6 (L) 02/23/2020   MCV 96.6 02/23/2020   PLT 315 02/23/2020      Component Value Date/Time   NA 146 (H) 02/23/2020 1203   NA 141 10/06/2019 1234   K 3.1 (L) 02/23/2020 1203   CL 111 02/23/2020 1203   CO2 26 02/23/2020 1203   GLUCOSE 211 (H) 02/23/2020 1203   BUN 27 (H) 02/23/2020 1203   BUN 9 10/06/2019 1234   CREATININE 0.87 02/23/2020 1203   CREATININE 0.83 01/23/2016 1642   CALCIUM 8.4 (L) 02/23/2020 1203   PROT 5.6 (L) 02/23/2020 0445  PROT 7.1 10/06/2019 1234   ALBUMIN 2.9 (L) 02/23/2020 0445   ALBUMIN 4.5 10/06/2019 1234   AST 49 (H) 02/23/2020 0445   ALT 57 (H) 02/23/2020 0445   ALKPHOS 27 (L) 02/23/2020 0445   BILITOT 0.5 02/23/2020 0445   BILITOT 0.2 10/06/2019 1234   GFRNONAA >60 02/23/2020 1203   GFRNONAA >89 01/23/2016 1642   GFRAA 125 10/06/2019 1234   GFRAA >89 01/23/2016 1642   Lab Results  Component Value Date   CHOL 106 10/06/2019   HDL 49 10/06/2019   LDLCALC 34 10/06/2019   TRIG 136 10/06/2019   CHOLHDL 2.2 10/06/2019   Lab Results  Component Value Date   HGBA1C 6.2 (A) 01/08/2020   No results found for: [id number] Lab Results  Component Value Date   TSH 1.717 02/22/2020      ASSESSMENT AND PLAN 35 y.o. year old female  has a past medical history of Blind left eye, Deaf, Developmental delay, Diabetes mellitus without complication ([place]), and Mental retardation. here with:  Developmental delay with behavioral issues   Continue Risperdal 3 mg 1/2 tablet BID.   Return to PCP for ongoing follow-up.  Follow-up with our office on an as-needed basis    I spent 20 minutes of face-to-face and non-face-to-face time with patient.  This included previsit chart review, lab review, study review, order entry, electronic health record documentation, patient education.  Ward Givens, MSN, NP-C 04/25/2020, 2:13 PM [facility] [address], [place], [place] [address] [phone]

## 2020-04-26 MED FILL — ACCU-CHEK SOFTCLIX LANCETS: 30 days supply | Qty: 100 | Fill #2

## 2020-04-26 MED FILL — METFORMIN HCL 500 MG TABS: 500 | 30 days supply | Qty: 120 | Fill #0

## 2020-04-26 MED FILL — ACCU-CHEK GUIDE TEST STRIP: 30 days supply | Qty: 100 | Fill #2

## 2020-05-16 ENCOUNTER — Other Ambulatory Visit: Payer: Self-pay | Admitting: Nurse Practitioner

## 2020-05-16 DIAGNOSIS — E119 Type 2 diabetes mellitus without complications: Secondary | ICD-10-CM

## 2020-05-16 MED FILL — risperiDONE 3 MG TABS: 3 | 30 days supply | Qty: 30 | Fill #1

## 2020-05-16 MED FILL — ACCU-CHEK GUIDE TEST STRIP: 33 days supply | Qty: 100 | Fill #1

## 2020-05-18 ENCOUNTER — Other Ambulatory Visit: Payer: Self-pay

## 2020-05-24 ENCOUNTER — Emergency Department (HOSPITAL_COMMUNITY): Payer: Medicaid Other

## 2020-05-24 ENCOUNTER — Other Ambulatory Visit: Payer: Self-pay

## 2020-05-24 ENCOUNTER — Emergency Department (HOSPITAL_COMMUNITY)
Admission: EM | Admit: 2020-05-24 | Discharge: 2020-05-24 | Disposition: A | Discharge status: Home / Self Care | Payer: Medicaid Other | Attending: Emergency Medicine | Admitting: Emergency Medicine

## 2020-05-24 DIAGNOSIS — Z79899 Other long term (current) drug therapy: Secondary | ICD-10-CM | POA: Insufficient documentation

## 2020-05-24 DIAGNOSIS — I10 Essential (primary) hypertension: Secondary | ICD-10-CM | POA: Insufficient documentation

## 2020-05-24 DIAGNOSIS — Z7984 Long term (current) use of oral hypoglycemic drugs: Secondary | ICD-10-CM | POA: Insufficient documentation

## 2020-05-24 DIAGNOSIS — Z8616 Personal history of COVID-19: Secondary | ICD-10-CM | POA: Insufficient documentation

## 2020-05-24 DIAGNOSIS — E119 Type 2 diabetes mellitus without complications: Secondary | ICD-10-CM | POA: Diagnosis not present

## 2020-05-24 DIAGNOSIS — R55 Syncope and collapse: Secondary | ICD-10-CM | POA: Insufficient documentation

## 2020-05-24 DIAGNOSIS — E039 Hypothyroidism, unspecified: Secondary | ICD-10-CM | POA: Insufficient documentation

## 2020-05-24 LAB — RAPID URINE DRUG SCREEN, HOSP PERFORMED
Amphetamines: NOT DETECTED
Barbiturates: NOT DETECTED
Benzodiazepines: NOT DETECTED
Cocaine: NOT DETECTED
Opiates: NOT DETECTED
Tetrahydrocannabinol: NOT DETECTED

## 2020-05-24 LAB — CBC
HCT: 34.6 % — ABNORMAL LOW (ref 36.0–46.0)
Hemoglobin: 11.3 g/dL — ABNORMAL LOW (ref 12.0–15.0)
MCH: 30.1 pg (ref 26.0–34.0)
MCHC: 32.7 g/dL (ref 30.0–36.0)
MCV: 92 fL (ref 80.0–100.0)
Platelets: 329 10*3/uL (ref 150–400)
RBC: 3.76 MIL/uL — ABNORMAL LOW (ref 3.87–5.11)
RDW: 12.8 % (ref 11.5–15.5)
WBC: 7.4 10*3/uL (ref 4.0–10.5)
nRBC: 0 % (ref 0.0–0.2)

## 2020-05-24 LAB — I-STAT BETA HCG BLOOD, ED (MC, WL, AP ONLY): I-stat hCG, quantitative: <5 m[IU]/mL (ref <5)

## 2020-05-24 LAB — URINALYSIS, ROUTINE W REFLEX MICROSCOPIC
Bilirubin Urine: NEGATIVE
Glucose, UA: NEGATIVE mg/dL
Hgb urine dipstick: NEGATIVE
Ketones, ur: NEGATIVE mg/dL
Leukocytes,Ua: NEGATIVE
Nitrite: NEGATIVE
Protein, ur: NEGATIVE mg/dL
Specific Gravity, Urine: 1.009 (ref 1.005–1.030)
pH: 5 (ref 5.0–8.0)

## 2020-05-24 LAB — D-DIMER, QUANTITATIVE: D-Dimer, Quant: 0.38 ug/mL-FEU (ref 0.00–0.50)

## 2020-05-24 LAB — BASIC METABOLIC PANEL
Anion gap: 4 — ABNORMAL LOW (ref 5–15)
BUN: 9 mg/dL (ref 6–20)
CO2: 28 mmol/L (ref 22–32)
Calcium: 9.7 mg/dL (ref 8.9–10.3)
Chloride: 104 mmol/L (ref 98–111)
Creatinine, Ser: 0.89 mg/dL (ref 0.44–1.00)
GFR, Estimated: >60 mL/min (ref >60)
Glucose, Bld: 111 mg/dL — ABNORMAL HIGH (ref 70–99)
Potassium: 4.3 mmol/L (ref 3.5–5.1)
Sodium: 136 mmol/L (ref 135–145)

## 2020-05-24 LAB — TSH: TSH: 3.311 u[IU]/mL (ref 0.350–4.500)

## 2020-05-24 LAB — CBG MONITORING, ED: Glucose-Capillary: 108 mg/dL — ABNORMAL HIGH (ref 70–99)

## 2020-05-24 MED ORDER — LORAZEPAM 2 MG/ML IJ SOLN
1.0000 mg | Freq: Once | INTRAMUSCULAR | Status: AC
  Administered 2020-05-24: 0.5 mg via INTRAVENOUS
  Filled 2020-05-24: qty 1

## 2020-05-24 NOTE — Discharge Instructions (Addendum)
Your child's work-up today was reassuring, as we discussed he did have the option of being admitted to the hospital but you decided to follow-up with your primary care doctor, make sure you to schedule an appointment with them as soon as possible.  Make sure she stays hydrated, I would also recommend checking her blood pressure 2-3 times a day, keeping a log of this.  Make sure she is drinking at least 8 cups of water a day, as we discussed you can flavor this with propel or Gatorade powder since she does not like plain water.  I also put in information for a stomach doctor as you requested, schedule appointment with them.  I I would also like you to schedule an appointment with a cardiologist sense as we discussed passing out could be related to her heart, their info is also above.  If you notice any new or worsening concerning symptoms or if she passed out again, please come back to the emergency department.  Patient did not leave her alone for long periods of time. Please read the following attachments.

## 2020-05-24 NOTE — ED Provider Notes (Signed)
[facility] EMERGENCY DEPARTMENT Provider Note   CSN: [id number] Arrival date & time: 05/24/20  1043     History No chief complaint on file.   Makayla Roberts is a 35 y.o. female with pertinent past medical history of developmental delay, blind left eye, deaf, followed by neurology, type 2 diabetes that presents the emergency department today from home for 2 episodes of syncope.  Patient is nonverbal, history provided by patient's cousin.  Cousin states that she had Covid in January, states that since then after being discharged from the hospital she has not been feeling well.  Has not been eating as much as she normally does.  States also since then her hot hair has been falling out more than normal.  She states that earlier this week she passed out while eating, states that she fell to the floor, lasted a couple seconds.  Denied any seizure-like activity, at that time her blood pressure was low but does not know how low it was.  CBG was normal at that time.  States that yesterday she passed out again, second episode.  At this time her CBG was 45 and she was foaming at the mouth.  Denies any urinary incontinence or clonic movements.  States that this also lasted a couple seconds and then she was back to baseline.  Cousin states that she is mentally at baseline, however they are concerned about the 2 episodes of passing out.  Denies any new medications, patient is on lisinopril, Metformin and Risperdal.  No fevers.  This is never happened before, no seizure history.  HPI     Past Medical History:  Diagnosis Date  . Blind left eye   . Deaf   . Developmental delay   . Diabetes mellitus without complication ([place])   . Mental retardation     Patient Active Problem List   Diagnosis Date Noted  . Acute respiratory failure with hypoxia ([place]) 02/19/2020  . Severe sepsis ([place]) 02/19/2020  . Acute respiratory disease due to COVID-19 virus 02/19/2020  . Aspiration pneumonia ([place])  02/19/2020  . AKI (acute kidney injury) ([place]) 02/19/2020  . Hypotension due to hypovolemia 02/18/2020  . Supraventricular tachycardia ([place]) 10/31/2018  . Sorethroat 03/03/2017  . Itchy eyes 08/12/2016  . Polyphagia 08/12/2016  . Behavior problem, adult 05/12/2016  . School physical exam 06/20/2015  . Type 2 diabetes mellitus without complication, without long-term current use of insulin ([place]) 06/20/2015  . Intellectual disability 12/27/2014  . Tachycardia with hypertension 12/27/2014  . Right ear impacted cerumen 09/14/2014  . Upper respiratory infection 09/14/2014  . Sinus tachycardia 09/14/2014  . Type 2 diabetes mellitus without complication ([place]) 42/35/3614  . Redness of eye, left 08/23/2014  . Other specified diabetes mellitus without complications ([place]) 43/15/4008  . Allergic rhinoconjunctivitis of left eye 03/15/2014  . Hypovitaminosis D 03/02/2013  . Unspecified hypothyroidism 03/02/2013  . Essential hypertension, benign 03/02/2013  . Blindness of left eye 01/11/2013  . Impaired speech 01/11/2013  . Developmental disability 01/11/2013    Past Surgical History:  Procedure Laterality Date  . None       OB History    Gravida  0   Para  0   Term  0   Preterm  0   AB  0   Living        SAB  0   IAB  0   Ectopic  0   Multiple      Live Births  Family History  Problem Relation Age of Onset  . Hypertension Mother   . Arthritis Mother   . Heart disease Mother   . Diabetes Father   . Hypertension Father     Social History   Tobacco Use  . Smoking status: Never Smoker  . Smokeless tobacco: Never Used  Vaping Use  . Vaping Use: Never used  Substance Use Topics  . Alcohol use: No  . Drug use: No    Home Medications Prior to Admission medications   Medication Sig Start Date End Date Taking? Authorizing Provider  Accu-Chek Softclix Lancets lancets USE AS DIRECTED UP TO 3 TIMES DAILY. 01/08/20 01/07/21  Gildardo Pounds, NP   acetaminophen (TYLENOL) 325 MG tablet Take 2 tablets (650 mg total) by mouth every 6 (six) hours as needed for mild pain or headache (fever >/= 101). 02/23/20   Blain Pais, MD  Blood Glucose Monitoring Suppl (TRUE METRIX METER) w/Device KIT Use to check blood sugar TID. E11.9 03/06/20   Charlott Rakes, MD  feeding supplement (ENSURE ENLIVE / ENSURE PLUS) LIQD Take 237 mLs by mouth 2 (two) times daily between meals. 02/23/20   Blain Pais, MD  food thickener (THICK IT) POWD Use it to thicken liquids 02/23/20   Adele Barthel D, MD  glucose blood test strip USE TO CHECK BLOOD SUGAR 3 TIMES DAILY 03/06/20 03/06/21  Charlott Rakes, MD  guaiFENesin-dextromethorphan (ROBITUSSIN DM) 100-10 MG/5ML syrup Take 10 mLs by mouth every 4 (four) hours as needed for cough. 02/23/20   Adele Barthel D, MD  lisinopril (ZESTRIL) 5 MG tablet TAKE 1 TABLET (5 MG TOTAL) BY MOUTH DAILY. 03/20/20 03/20/21  [name] Donovan, PA-C  Melatonin 5 MG CHEW Chew 5 mg by mouth at bedtime as needed (sleep).    [provider]  metFORMIN (GLUCOPHAGE) 500 MG tablet TAKE 2 TABLETS (1,000 MG TOTAL) BY MOUTH 2 (TWO) TIMES DAILY WITH A MEAL. 03/20/20 03/20/21  [name] Donovan, PA-C  Olopatadine HCl 0.2 % SOLN INSTILL 1 DROP IN BOTH EYES DAILY. 04/01/20 04/01/21  Debbra Riding, MD  risperiDONE (RISPERDAL) 3 MG tablet TAKE 1/2 TABLET BY MOUTH 2 TIMES DAILY. 03/20/20 03/20/21  [name] Donovan, PA-C  TRUEplus Lancets 28G MISC USE TO CHECK BLOOD SUGAR 3 TIMES DAILY 03/06/20 03/06/21  Charlott Rakes, MD    Allergies    Morphine and related  Review of Systems   Review of Systems  Unable to perform ROS: Patient nonverbal    Physical Exam Updated Vital Signs BP 122/87 (BP Location: Right Arm)   Pulse 98   Temp 97.7 F (36.5 C) (Oral)   Resp (!) 22   SpO2 99%   Physical Exam Constitutional:      General: She is not in acute distress.    Appearance: Normal appearance. She is not ill-appearing,  toxic-appearing or diaphoretic.  HENT:     Head: Normocephalic and atraumatic.  Eyes:     Comments: Blind L, tracks with right, perrla right  Cardiovascular:     Rate and Rhythm: Normal rate and regular rhythm.     Pulses: Normal pulses.  Pulmonary:     Effort: Pulmonary effort is normal. No respiratory distress.     Breath sounds: Normal breath sounds. No wheezing.  Abdominal:     General: Abdomen is flat. There is no distension.     Tenderness: There is no abdominal tenderness.     Comments: Patient drinking water without any difficulties.  Musculoskeletal:  General: No swelling. Normal range of motion.     Cervical back: Normal range of motion.     Right lower leg: No edema.     Left lower leg: No edema.  Skin:    General: Skin is warm and dry.     Capillary Refill: Capillary refill takes less than 2 seconds.  Neurological:     Mental Status: She is alert. Mental status is at baseline.     Motor: No weakness.     Gait: Gait normal.     ED Results / Procedures / Treatments   Labs (all labs ordered are listed, but only abnormal results are displayed) Labs Reviewed  BASIC METABOLIC PANEL - Abnormal; Notable for the following components:      Result Value   Glucose, Bld 111 (*)    Anion gap 4 (*)    All other components within normal limits  CBC - Abnormal; Notable for the following components:   RBC 3.76 (*)    Hemoglobin 11.3 (*)    HCT 34.6 (*)    All other components within normal limits  URINALYSIS, ROUTINE W REFLEX MICROSCOPIC - Abnormal; Notable for the following components:   APPearance HAZY (*)    All other components within normal limits  CBG MONITORING, ED - Abnormal; Notable for the following components:   Glucose-Capillary 108 (*)    All other components within normal limits  TSH  RAPID URINE DRUG SCREEN, HOSP PERFORMED  D-DIMER, QUANTITATIVE  I-STAT BETA HCG BLOOD, ED ([facility], [facility], [facility] ONLY)    EKG EKG Interpretation  Date/Time:  Friday May 24 2020 10:58:54 EDT Ventricular Rate:  88 PR Interval:  140 QRS Duration: 72 QT Interval:  342 QTC Calculation: 413 R Axis:   45 Text Interpretation: Normal sinus rhythm Normal ECG Confirmed by Davonna Belling [phone]) on 05/24/2020 4:16:34 PM   Radiology CT Head Wo Contrast  Result Date: 05/24/2020 CLINICAL DATA:  Syncope. EXAM: CT HEAD WITHOUT CONTRAST TECHNIQUE: Contiguous axial images were obtained from the base of the skull through the vertex without intravenous contrast. COMPARISON:  None. FINDINGS: Brain: No evidence of large-territorial acute infarction. No parenchymal hemorrhage. No mass lesion. No extra-axial collection. No mass effect or midline shift. No hydrocephalus. Basilar cisterns are patent. 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 Vascular: No hyperdense vessel. Skull: No acute fracture or focal lesion. Sinuses/Orbits: Paranasal sinuses and mastoid air cells are clear. Left orbit prosthesis. Otherwise the orbits are unremarkable. Other: None. IMPRESSION: No acute intracranial abnormality. 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 Electronically Signed   By: Iven Finn M.D.   On: 05/24/2020 15:31    Procedures Procedures   Medications Ordered in ED Medications  LORazepam (ATIVAN) injection 1 mg (0.5 mg Intravenous Given 05/24/20 1449)    ED Course  I have reviewed the triage vital signs and the nursing notes.  Pertinent labs & imaging results that were available during my care of the patient were reviewed by me and considered in my medical decision making (see chart for details).    MDM Rules/Calculators/A&P                          Alayha Stege is a 35 y.o. female with pertinent past medical history of developmental delay, blind left eye, deaf, followed by neurology, type 2 diabetes that presents the emergency department today from home for 2 episodes of syncope.  Patient is  neurologically at baseline,  presenting with mom and cousin.  Work-up today unremarkable including normal CBC and BMP, D-dimer, urinalysis.  Normal TSH.  CT head without any acute intracranial abnormality.  Patient has been here for almost 6 hours without any syncopal episodes, normal blood pressure, normal orthostatics.  EKG interpreted me without signs of arrhythmia.  Unsure what is causing syncope, most likely hypotension, could be from low intake.  Shared decision-making with mom and cousin about patient being admitted for recurrent syncope, they decided that they would rather have her go home and follow-up with PCP.  Will refer to cardiologist, patient be discharged at this time.  Strict return precautions given, close follow-up with PCP recommended, they are agreeable.   Doubt need for further emergent work up at this time. I explained the diagnosis and have given explicit precautions to return to the ER including for any other new or worsening symptoms. The patient understands and accepts the medical plan as it's been dictated and I have answered their questions. Discharge instructions concerning home care and prescriptions have been given. The patient is STABLE and is discharged to home in good condition.  I discussed this case with my attending physician who cosigned this note including patient's presenting symptoms, physical exam, and planned diagnostics and interventions. Attending physician stated agreement with plan or made changes to plan which were implemented.    Final Clinical Impression(s) / ED Diagnoses Final diagnoses:  Syncope, unspecified syncope type    Rx / DC Orders ED Discharge Orders    None       Alfredia Client, PA-C 05/24/20 1625    Davonna Belling, MD 05/26/20 [phone]

## 2020-05-24 NOTE — ED Notes (Signed)
Patient transported to CT 

## 2020-05-24 NOTE — ED Triage Notes (Signed)
Pt here from home with c/o not feeling well , passing out at the table for short periods of time , also cbg have been running high and her hair has been falling out all  hx comes from family , pt is developmentally  delayed

## 2020-05-24 NOTE — ED Notes (Signed)
Patient pulled EKG leads, 02 sat probe and BP cuff off. Up walking around room, eating an apple. Mother and sister at bedside.

## 2020-05-28 ENCOUNTER — Other Ambulatory Visit: Payer: Self-pay

## 2020-05-28 MED FILL — Lancets: 30 days supply | Qty: 200 | Fill #0 | Status: AC

## 2020-05-28 MED FILL — Metformin HCl Tab 500 MG: ORAL | 30 days supply | Qty: 120 | Fill #0 | Status: AC

## 2020-06-13 ENCOUNTER — Other Ambulatory Visit: Payer: Self-pay | Admitting: Pharmacist

## 2020-06-13 ENCOUNTER — Other Ambulatory Visit: Payer: Self-pay

## 2020-06-13 DIAGNOSIS — E119 Type 2 diabetes mellitus without complications: Secondary | ICD-10-CM

## 2020-06-13 MED ORDER — ACCU-CHEK GUIDE VI STRP
ORAL_STRIP | 0 refills | Status: DC
  Filled 2020-06-13: qty 300, 90d supply, fill #0

## 2020-06-13 MED ORDER — METFORMIN HCL 500 MG PO TABS
ORAL_TABLET | Freq: Two times a day (BID) | ORAL | 0 refills | Status: DC
  Filled 2020-06-13: qty 360, 90d supply, fill #0

## 2020-06-13 MED FILL — Glucose Blood Test Strip: 25 days supply | Qty: 100 | Fill #0 | Status: CN

## 2020-06-13 MED FILL — Lisinopril Tab 5 MG: ORAL | 90 days supply | Qty: 90 | Fill #0 | Status: AC

## 2020-06-13 MED FILL — Metformin HCl Tab 500 MG: ORAL | 30 days supply | Qty: 120 | Fill #1 | Status: CN

## 2020-06-13 MED FILL — Risperidone Tab 3 MG: ORAL | 90 days supply | Qty: 90 | Fill #0 | Status: AC

## 2020-06-14 ENCOUNTER — Other Ambulatory Visit: Payer: Self-pay

## 2020-10-01 ENCOUNTER — Other Ambulatory Visit: Payer: Self-pay

## 2020-10-01 ENCOUNTER — Other Ambulatory Visit: Payer: Self-pay | Admitting: Physician Assistant

## 2020-10-01 ENCOUNTER — Other Ambulatory Visit: Payer: Self-pay | Admitting: Family Medicine

## 2020-10-01 DIAGNOSIS — I10 Essential (primary) hypertension: Secondary | ICD-10-CM

## 2020-10-01 DIAGNOSIS — E119 Type 2 diabetes mellitus without complications: Secondary | ICD-10-CM

## 2020-10-01 MED FILL — Risperidone Tab 3 MG: ORAL | 90 days supply | Qty: 90 | Fill #1 | Status: AC

## 2020-10-01 NOTE — Telephone Encounter (Signed)
Requested medication (s) are due for refill today: yes   Requested medication (s) are on the active medication list:  yes   Last refill:  06/14/2020  Future visit scheduled: no  Notes to clinic:  overdue for office visit Message sent to patient to contact office Requesting a 90 day supply    Requested Prescriptions  Pending Prescriptions Disp Refills   metFORMIN (GLUCOPHAGE) 500 MG tablet 360 tablet 0    Sig: TAKE 2 TABLETS (1,000 MG TOTAL) BY MOUTH 2 (TWO) TIMES DAILY WITH A MEAL.     Endocrinology:  Diabetes - Biguanides Failed - 10/01/2020 10:16 AM      Failed - HBA1C is between 0 and 7.9 and within 180 days    Hemoglobin A1C  Date Value Ref Range Status  01/08/2020 6.2 (A) 4.0 - 5.6 % Final   HbA1c, POC (controlled diabetic range)  Date Value Ref Range Status  10/06/2019 7.3 (A) 0.0 - 7.0 % Final          Failed - Valid encounter within last 6 months    Recent Outpatient Visits           6 months ago Essential hypertension   [facility] McClung, Angela M, PA-C   8 months ago Type 2 diabetes mellitus without complication, without long-term current use of insulin (HCC)   [facility] Fleming, Zelda W, NP   12 months ago Need for influenza vaccination   [facility] Van Ausdall, Stephen L, RPH-CPP   12 months ago Type 2 diabetes mellitus without complication, without long-term current use of insulin (HCC)   [facility] Stephens, Amy J, NP   1 year ago Type 2 diabetes mellitus without complication, without long-term current use of insulin (HCC)   [facility] Fleming, Zelda W, NP              Passed - Cr in normal range and within 360 days    Creat  Date Value Ref Range Status  01/23/2016 0.83 0.50 - 1.10 mg/dL Final   Creatinine, Ser  Date Value Ref Range Status  05/24/2020 0.89 0.44 - 1.00 mg/dL Final    Creatinine, Urine  Date Value Ref Range Status  01/23/2016 231 20 - 320 mg/dL Final          Passed - eGFR in normal range and within 360 days    GFR, Est African American  Date Value Ref Range Status  01/23/2016 >89 >=60 mL/min Final   GFR calc Af Amer  Date Value Ref Range Status  10/06/2019 125 >59 mL/min/1.73 Final    Comment:    **[facility] currently reports eGFR in compliance with the current**   recommendations of the National Kid[employer] Foundation. [facility] will   update reporting as new guidelines are published from the NKF-ASN   Task force.    GFR, Est Non African American  Date Value Ref Range Status  01/23/2016 >89 >=60 mL/min Final   GFR, Estimated  Date Value Ref Range Status  05/24/2020 >60 >60 mL/min Final    Comment:    (NOTE) Calculated using the CKD-EPI Creatinine Equation (2021)                

## 2020-10-01 NOTE — Telephone Encounter (Signed)
Requested medication (s) are due for refill today: yes    Requested medication (s) are on the active medication list:  yes    Last refill:  06/14/2020   Future visit scheduled: no   Notes to clinic:  overdue for office visit Message sent to patient to contact office Requesting a 90 day supply    Requested Prescriptions  Pending Prescriptions Disp Refills   lisinopril (ZESTRIL) 5 MG tablet 90 tablet 1    Sig: TAKE 1 TABLET (5 MG TOTAL) BY MOUTH DAILY.     Cardiovascular:  ACE Inhibitors Failed - 10/01/2020 10:16 AM      Failed - Valid encounter within last 6 months    Recent Outpatient Visits           6 months ago Essential hypertension   [facility] [place], [place], [place]   8 months ago Type 2 diabetes mellitus without complication, without long-term current use of insulin [facility])   [facility] [place], Shea Stakes, NP   12 months ago Need for influenza vaccination   [facility] [place], Cornelius Moras, RPH-CPP   12 months ago Type 2 diabetes mellitus without complication, without long-term current use of insulin (HCC)   [facility] [place], [place] J, NP   1 year ago Type 2 diabetes mellitus without complication, without long-term current use of insulin (HCC)   [facility] [place], [place] W, NP              Passed - Cr in normal range and within 180 days    Creat  Date Value Ref Range Status  01/23/2016 0.83 0.50 - 1.10 mg/dL Final   Creatinine, Ser  Date Value Ref Range Status  05/24/2020 0.89 0.44 - 1.00 mg/dL Final   Creatinine, Urine  Date Value Ref Range Status  01/23/2016 231 20 - 320 mg/dL Final          Passed - K in normal range and within 180 days    Potassium  Date Value Ref Range Status  05/24/2020 4.3 3.5 - 5.1 mmol/L Final          Passed - Patient is not pregnant      Passed - Last BP in normal range     BP Readings from Last 1 Encounters:  05/24/20 125/78

## 2020-10-02 ENCOUNTER — Other Ambulatory Visit: Payer: Self-pay

## 2020-10-21 ENCOUNTER — Other Ambulatory Visit: Payer: Self-pay | Admitting: Physician Assistant

## 2020-10-21 ENCOUNTER — Other Ambulatory Visit: Payer: Self-pay | Admitting: Family Medicine

## 2020-10-21 DIAGNOSIS — E119 Type 2 diabetes mellitus without complications: Secondary | ICD-10-CM

## 2020-10-21 DIAGNOSIS — I10 Essential (primary) hypertension: Secondary | ICD-10-CM

## 2020-10-22 MED ORDER — LISINOPRIL 5 MG PO TABS
ORAL_TABLET | Freq: Every day | ORAL | 0 refills | Status: DC
  Filled 2020-10-22: qty 90, 90d supply, fill #0

## 2020-10-22 NOTE — Telephone Encounter (Signed)
Courtesy refill #[id number]

## 2020-10-23 ENCOUNTER — Other Ambulatory Visit: Payer: Self-pay

## 2020-10-30 ENCOUNTER — Other Ambulatory Visit: Payer: Self-pay

## 2020-12-25 ENCOUNTER — Other Ambulatory Visit: Payer: Self-pay

## 2020-12-25 ENCOUNTER — Other Ambulatory Visit: Payer: Self-pay | Admitting: Pharmacist

## 2020-12-25 DIAGNOSIS — E119 Type 2 diabetes mellitus without complications: Secondary | ICD-10-CM

## 2020-12-25 MED ORDER — METFORMIN HCL 500 MG PO TABS
ORAL_TABLET | Freq: Two times a day (BID) | ORAL | 0 refills | Status: DC
  Filled 2020-12-25: qty 120, 30d supply, fill #0
  Filled 2020-12-25: qty 240, 60d supply, fill #0

## 2020-12-26 ENCOUNTER — Other Ambulatory Visit: Payer: Self-pay

## 2021-04-01 ENCOUNTER — Telehealth: Payer: Medicaid Other | Admitting: Nurse Practitioner

## 2021-04-04 ENCOUNTER — Other Ambulatory Visit: Payer: Self-pay

## 2021-04-04 ENCOUNTER — Ambulatory Visit: Payer: Medicaid Other | Attending: Nurse Practitioner | Admitting: Nurse Practitioner

## 2021-04-04 ENCOUNTER — Ambulatory Visit: Payer: Medicaid Other | Admitting: Nurse Practitioner

## 2021-04-04 VITALS — BP 120/83 | HR 100 | Resp 16 | Wt 138.0 lb

## 2021-04-04 DIAGNOSIS — R451 Restlessness and agitation: Secondary | ICD-10-CM | POA: Insufficient documentation

## 2021-04-04 DIAGNOSIS — F5104 Psychophysiologic insomnia: Secondary | ICD-10-CM | POA: Insufficient documentation

## 2021-04-04 DIAGNOSIS — I10 Essential (primary) hypertension: Secondary | ICD-10-CM | POA: Insufficient documentation

## 2021-04-04 DIAGNOSIS — H9190 Unspecified hearing loss, unspecified ear: Secondary | ICD-10-CM | POA: Diagnosis not present

## 2021-04-04 DIAGNOSIS — Z79899 Other long term (current) drug therapy: Secondary | ICD-10-CM | POA: Diagnosis not present

## 2021-04-04 DIAGNOSIS — F79 Unspecified intellectual disabilities: Secondary | ICD-10-CM | POA: Insufficient documentation

## 2021-04-04 DIAGNOSIS — H5462 Unqualified visual loss, left eye, normal vision right eye: Secondary | ICD-10-CM | POA: Diagnosis not present

## 2021-04-04 DIAGNOSIS — I471 Supraventricular tachycardia: Secondary | ICD-10-CM | POA: Diagnosis not present

## 2021-04-04 DIAGNOSIS — E1165 Type 2 diabetes mellitus with hyperglycemia: Secondary | ICD-10-CM | POA: Diagnosis not present

## 2021-04-04 DIAGNOSIS — R6250 Unspecified lack of expected normal physiological development in childhood: Secondary | ICD-10-CM | POA: Diagnosis not present

## 2021-04-04 DIAGNOSIS — Z760 Encounter for issue of repeat prescription: Secondary | ICD-10-CM | POA: Diagnosis present

## 2021-04-04 DIAGNOSIS — E119 Type 2 diabetes mellitus without complications: Secondary | ICD-10-CM

## 2021-04-04 DIAGNOSIS — Z7984 Long term (current) use of oral hypoglycemic drugs: Secondary | ICD-10-CM | POA: Diagnosis not present

## 2021-04-04 DIAGNOSIS — F89 Unspecified disorder of psychological development: Secondary | ICD-10-CM | POA: Insufficient documentation

## 2021-04-04 DIAGNOSIS — R509 Fever, unspecified: Secondary | ICD-10-CM | POA: Insufficient documentation

## 2021-04-04 MED ORDER — STARCH (THICKENING) PO POWD
ORAL | 0 refills | Status: AC
  Filled 2021-04-04: qty 284, fill #0

## 2021-04-04 MED ORDER — LISINOPRIL 5 MG PO TABS
ORAL_TABLET | Freq: Every day | ORAL | 0 refills | Status: DC
  Filled 2021-04-04: qty 90, 90d supply, fill #0

## 2021-04-04 MED ORDER — METFORMIN HCL 500 MG PO TABS
ORAL_TABLET | Freq: Two times a day (BID) | ORAL | 0 refills | Status: DC
  Filled 2021-04-04: qty 240, 60d supply, fill #0

## 2021-04-04 MED ORDER — RISPERIDONE 3 MG PO TABS
ORAL_TABLET | ORAL | 3 refills | Status: DC
  Filled 2021-04-04: qty 60, 60d supply, fill #0
  Filled 2021-05-20 – 2021-05-29 (×2): qty 60, 60d supply, fill #1
  Filled 2021-08-05: qty 60, 60d supply, fill #2
  Filled 2021-10-02: qty 60, 60d supply, fill #3

## 2021-04-04 MED ORDER — ENSURE ENLIVE PO LIQD
237.0000 mL | Freq: Two times a day (BID) | ORAL | 12 refills | Status: AC
  Filled 2021-04-04: qty 237, 1d supply, fill #0

## 2021-04-04 MED ORDER — ACCU-CHEK GUIDE VI STRP
ORAL_STRIP | 0 refills | Status: DC
  Filled 2021-04-04: qty 300, 90d supply, fill #0

## 2021-04-04 MED ORDER — MELATONIN 5 MG PO CHEW
5.0000 mg | CHEWABLE_TABLET | Freq: Every evening | ORAL | 3 refills | Status: AC | PRN
  Filled 2021-04-04: qty 90, fill #0

## 2021-04-04 NOTE — Progress Notes (Signed)
Assessment & Plan:  Makayla Roberts Roberts was seen today for medication refill.  Diagnoses and all orders for this visit:  Type 2 diabetes mellitus without complication, without long-term current use of insulin (HCC) -     CMP14+EGFR -     Hemoglobin A1c -     lisinopril (ZESTRIL) 5 MG tablet; TAKE 1 TABLET (5 MG TOTAL) BY MOUTH DAILY. -     metFORMIN (GLUCOPHAGE) 500 MG tablet; TAKE 2 TABLETS (1,000 MG TOTAL) BY MOUTH 2 (TWO) TIMES DAILY WITH A MEAL. -     glucose blood (ACCU-CHEK GUIDE) test strip; Use TID to check blood sugar.  Essential hypertension -     lisinopril (ZESTRIL) 5 MG tablet; TAKE 1 TABLET (5 MG TOTAL) BY MOUTH DAILY.  Fever, unspecified fever cause -     CBC with Differential  Supraventricular tachycardia (HCC) Controlled  Type 2 diabetes mellitus with hyperglycemia, without long-term current use of insulin (HCC) -     CMP14+EGFR -     Hemoglobin A1c -     lisinopril (ZESTRIL) 5 MG tablet; TAKE 1 TABLET (5 MG TOTAL) BY MOUTH DAILY. -     metFORMIN (GLUCOPHAGE) 500 MG tablet; TAKE 2 TABLETS (1,000 MG TOTAL) BY MOUTH 2 (TWO) TIMES DAILY WITH A MEAL.  Psychophysiological insomnia -     Melatonin 5 MG CHEW; Chew 5 mg by mouth at bedtime as needed (sleep).  Developmental disability -     risperiDONE (RISPERDAL) 3 MG tablet; TAKE 1/2 TABLET BY MOUTH 2 TIMES DAILY. -     feeding supplement (ENSURE ENLIVE / ENSURE PLUS) LIQD; Take 237 mLs by mouth 2 (two) times daily between meals. -     food thickener (THICK IT) POWD; Use it to thicken liquids    Patient has been counseled on age-appropriate routine health concerns for screening and prevention. These are reviewed and up-to-date. Referrals have been placed accordingly. Immunizations are up-to-date or declined.    Subjective:   Chief Complaint  Patient presents with   Medication Refill   HPI Makayla Roberts Roberts 36 y.o. female presents to office today accompanied by her mother and sister. Her sister in law is translating for her  mother today. She has a history of developmental delay, mental retardation, DM2, agitation, deafness, Blind in left eye   DM 2 Well controlled with metformin 500 mg BID.  She is taking renal dose ACE. LDL at goal. Blood pressure is well controlled.  Lab Results  Component Value Date   HGBA1C 6.8 (H) 04/04/2021   Lab Results  Component Value Date   LDLCALC 34 10/06/2019   BP Readings from Last 3 Encounters:  04/04/21 120/83  05/24/20 125/78  04/25/20 112/79       Mom notes intermittent "fever". "Feels warm sometimes". Denies any URI symptoms but states patient has not been herself since she had COVID in January.    Review of Systems  Constitutional:  Negative for fever, malaise/fatigue and weight loss.  HENT: Negative.  Negative for nosebleeds.   Eyes:  Negative for pain and discharge.  Respiratory: Negative.  Negative for cough and shortness of breath.   Cardiovascular: Negative.  Negative for chest pain, palpitations and leg swelling.  Gastrointestinal: Negative.  Negative for heartburn, nausea and vomiting.  Musculoskeletal: Negative.  Negative for myalgias.  Neurological: Negative.  Negative for dizziness, focal weakness, seizures and headaches.  Psychiatric/Behavioral: Negative.  Negative for suicidal ideas.    Past Medical History:  Diagnosis Date   Blind left eye  Deaf    Developmental delay    Diabetes mellitus without complication ([place])    Mental retardation     Past Surgical History:  Procedure Laterality Date   None      Family History  Problem Relation Age of Onset   Hypertension Mother    Arthritis Mother    Heart disease Mother    Diabetes Father    Hypertension Father     Social History Reviewed with no changes to be made today.   Outpatient Medications Prior to Visit  Medication Sig Dispense Refill   acetaminophen (TYLENOL) 325 MG tablet Take 2 tablets (650 mg total) by mouth every 6 (six) hours as needed for mild pain or headache (fever  >/= 101).     Blood Glucose Monitoring Suppl (TRUE METRIX METER) w/Device KIT Use to check blood sugar TID. E11.9 1 kit 0   guaiFENesin-dextromethorphan (ROBITUSSIN DM) 100-10 MG/5ML syrup Take 10 mLs by mouth every 4 (four) hours as needed for cough. 118 mL 0   feeding supplement (ENSURE ENLIVE / ENSURE PLUS) LIQD Take 237 mLs by mouth 2 (two) times daily between meals. 237 mL 12   food thickener (THICK IT) POWD Use it to thicken liquids 284 g 0   glucose blood (ACCU-CHEK GUIDE) test strip Use TID to check blood sugar. 300 each 0   lisinopril (ZESTRIL) 5 MG tablet TAKE 1 TABLET (5 MG TOTAL) BY MOUTH DAILY. 90 tablet 0   Melatonin 5 MG CHEW Chew 5 mg by mouth at bedtime as needed (sleep).     metFORMIN (GLUCOPHAGE) 500 MG tablet TAKE 2 TABLETS (1,000 MG TOTAL) BY MOUTH 2 (TWO) TIMES DAILY WITH A MEAL. 240 tablet 0   risperiDONE (RISPERDAL) 3 MG tablet TAKE 1/2 TABLET BY MOUTH 2 TIMES DAILY. 60 tablet 3   No facility-administered medications prior to visit.    Allergies  Allergen Reactions   Morphine And Related Shortness Of Breath       Objective:    BP 120/83    Pulse 100    Resp 16    Wt 138 lb (62.6 kg)    SpO2 96%    BMI 28.84 kg/m  Wt Readings from Last 3 Encounters:  04/04/21 138 lb (62.6 kg)  04/25/20 130 lb (59 kg)  02/19/20 124 lb 12.5 oz (56.6 kg)    Physical Exam Vitals and nursing note reviewed.  Constitutional:      Appearance: She is well-developed.  HENT:     Head: Normocephalic and atraumatic.  Cardiovascular:     Rate and Rhythm: Normal rate and regular rhythm.     Heart sounds: Normal heart sounds. No murmur heard.   No friction rub. No gallop.  Pulmonary:     Effort: Pulmonary effort is normal. No tachypnea or respiratory distress.     Breath sounds: Normal breath sounds. No decreased breath sounds, wheezing, rhonchi or rales.  Chest:     Chest wall: No tenderness.  Abdominal:     General: Bowel sounds are normal.     Palpations: Abdomen is soft.   Musculoskeletal:        General: Normal range of motion.     Cervical back: Normal range of motion.  Skin:    General: Skin is warm and dry.  Neurological:     Mental Status: She is alert and oriented to person, place, and time.     Coordination: Coordination normal.  Psychiatric:        Behavior: Behavior is  hyperactive (difficulty sitting still).         Patient has been counseled extensively about nutrition and exercise as well as the importance of adherence with medications and regular follow-up. The patient was given clear instructions to go to ER or return to medical center if symptoms don't improve, worsen or new problems develop. The patient verbalized understanding.   Follow-up: Return in about 6 months (around 10/02/2021).   Gildardo Pounds, FNP-BC [facility] and [facility] [place], [place]   04/06/2021, 11:27 AM

## 2021-04-05 LAB — CBC WITH DIFFERENTIAL/PLATELET
Basophils Absolute: 0.1 10*3/uL (ref 0.0–0.2)
Basos: 1 %
EOS (ABSOLUTE): 0.1 10*3/uL (ref 0.0–0.4)
Eos: 2 %
Hematocrit: 34 % (ref 34.0–46.6)
Hemoglobin: 11.6 g/dL (ref 11.1–15.9)
Immature Grans (Abs): 0 10*3/uL (ref 0.0–0.1)
Immature Granulocytes: 0 %
Lymphocytes Absolute: 2.8 10*3/uL (ref 0.7–3.1)
Lymphs: 37 %
MCH: 30.2 pg (ref 26.6–33.0)
MCHC: 34.1 g/dL (ref 31.5–35.7)
MCV: 89 fL (ref 79–97)
Monocytes Absolute: 0.3 10*3/uL (ref 0.1–0.9)
Monocytes: 4 %
Neutrophils Absolute: 4.2 10*3/uL (ref 1.4–7.0)
Neutrophils: 56 %
Platelets: 285 10*3/uL (ref 150–450)
RBC: 3.84 x10E6/uL (ref 3.77–5.28)
RDW: 13.5 % (ref 11.7–15.4)
WBC: 7.5 10*3/uL (ref 3.4–10.8)

## 2021-04-05 LAB — HEMOGLOBIN A1C
Est. average glucose Bld gHb Est-mCnc: 148 mg/dL
Hgb A1c MFr Bld: 6.8 % — ABNORMAL HIGH (ref 4.8–5.6)

## 2021-04-05 LAB — CMP14+EGFR
ALT: 11 IU/L (ref 0–32)
AST: 8 IU/L (ref 0–40)
Albumin/Globulin Ratio: 2.1 (ref 1.2–2.2)
Albumin: 4.6 g/dL (ref 3.8–4.8)
Alkaline Phosphatase: 56 IU/L (ref 44–121)
BUN/Creatinine Ratio: 20 (ref 9–23)
BUN: 19 mg/dL (ref 6–20)
Bilirubin Total: <0.2 mg/dL (ref 0.0–1.2)
CO2: 19 mmol/L — ABNORMAL LOW (ref 20–29)
Calcium: 10.7 mg/dL — ABNORMAL HIGH (ref 8.7–10.2)
Chloride: 102 mmol/L (ref 96–106)
Creatinine, Ser: 0.95 mg/dL (ref 0.57–1.00)
Globulin, Total: 2.2 g/dL (ref 1.5–4.5)
Glucose: 179 mg/dL — ABNORMAL HIGH (ref 70–99)
Potassium: 4.6 mmol/L (ref 3.5–5.2)
Sodium: 137 mmol/L (ref 134–144)
Total Protein: 6.8 g/dL (ref 6.0–8.5)
eGFR: 79 mL/min/{1.73_m2} (ref >59)

## 2021-04-06 ENCOUNTER — Encounter: Payer: Self-pay | Admitting: Nurse Practitioner

## 2021-04-07 ENCOUNTER — Other Ambulatory Visit: Payer: Self-pay

## 2021-05-01 ENCOUNTER — Encounter: Payer: Self-pay | Admitting: Adult Health

## 2021-05-01 ENCOUNTER — Ambulatory Visit: Payer: Medicaid Other | Admitting: Adult Health

## 2021-05-01 VITALS — BP 128/86 | HR 120 | Ht 59.0 in | Wt 138.6 lb

## 2021-05-01 DIAGNOSIS — F89 Unspecified disorder of psychological development: Secondary | ICD-10-CM | POA: Diagnosis not present

## 2021-05-01 DIAGNOSIS — F69 Unspecified disorder of adult personality and behavior: Secondary | ICD-10-CM | POA: Diagnosis not present

## 2021-05-01 NOTE — Progress Notes (Signed)
? ? ?PATIENT: Makayla Roberts ?DOB: Apr 04, 1985 ? ?REASON FOR VISIT: follow up ?HISTORY FROM: patient ? ?HISTORY OF PRESENT ILLNESS: ?Today 05/01/21: ? ?Ms. Berquist is a 36 year old female with a history of developmental delay with behavioral issues.  She returns today for follow-up.  Her primary care is refilling her Risperdal. they report this medicine continues to work well for her.  She denies any new issues today. ? ?04/25/20 Ms. Reitman is a 36 year old female with a history of developmental delay with behavioral issues.  She returns today for follow-up.  She is here today with her mother.  Mother reports that Risperdal half a tablet twice a day continues to work well for her behavior.  She denies any new issues.  She returns today for an evaluation. ? ?10/26/19 Ms. Karg is a 36 year old female with a history of developmental delay with behavioral issues.  She returns today for follow-up.  She is here today with her sister-in-law.  At the last visit the sister-in-law requested referral to physical and Occupational Therapy as well as genetic testing.  Physical and Occupational Therapy did not feel that they had anything in addition to offer after reading the notes.  I will have the referral coordinator follow-up on genetic testing as this has not been done and they are still interested.  Overall she feels that the patient is doing well.  She is going to an adult day center now.  Denies any significant behavior issues.  Remains on Risperdal ? ?HISTORY 04/19/19: ?  ?Ms. Tarlton is a 36 year old female with a history of developmental delay with behavioral issues.  She returns today for follow-up.  She is here today with her mother and sister-in-law.  The sister-in-law does not feel that the patient has any behavioral issues.  She also has some concerns that the patient does not have a formal diagnosis.  She states that she has a pediatric nurse friend that she has been discussing the patient with.  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  She is requesting further testing to confirm or deny this diagnosis.  She also feels that the patient could benefit from physical therapy and Occupational Therapy.  They do have a report that there is been no change in her gait or balance since the last visit.  The sister-in-law also feels that the patient is not "deaf."  She would like a referral to audiology to have formal hearing testing. ? ?REVIEW OF SYSTEMS: Out of a complete 14 system review of symptoms, the patient complains only of the following symptoms, and all other reviewed systems are negative. ? ?See HPI ? ?ALLERGIES: ?Allergies  ?Allergen Reactions  ? Morphine And Related Shortness Of Breath  ? ? ?HOME MEDICATIONS: ?Outpatient Medications Prior to Visit  ?Medication Sig Dispense Refill  ? acetaminophen (TYLENOL) 325 MG tablet Take 2 tablets (650 mg total) by mouth every 6 (six) hours as needed for mild pain or headache (fever >/= 101).    ? Blood Glucose Monitoring Suppl (TRUE METRIX METER) w/Device KIT Use to check blood sugar TID. E11.9 1 kit 0  ? feeding supplement (ENSURE ENLIVE / ENSURE PLUS) LIQD Take 237 mLs by mouth 2 (two) times daily between meals. 237 mL 12  ? food thickener (THICK IT) POWD Use it to thicken liquids 284 g 0  ? glucose blood (ACCU-CHEK GUIDE) test strip Use TID to check blood sugar. 300 each 0  ? guaiFENesin-dextromethorphan (ROBITUSSIN DM) 100-10 MG/5ML syrup Take 10 mLs by mouth every  4 (four) hours as needed for cough. 118 mL 0  ? lisinopril (ZESTRIL) 5 MG tablet TAKE 1 TABLET (5 MG TOTAL) BY MOUTH DAILY. 90 tablet 0  ? Melatonin 5 MG CHEW Chew 5 mg by mouth at bedtime as needed (sleep). 90 tablet 3  ? metFORMIN (GLUCOPHAGE) 500 MG tablet TAKE 2 TABLETS (1,000 MG TOTAL) BY MOUTH 2 (TWO) TIMES DAILY WITH A MEAL. 240 tablet 0  ? risperiDONE (RISPERDAL) 3 MG tablet TAKE 1/2 TABLET BY MOUTH 2 TIMES DAILY. 60 tablet 3  ? ?No facility-administered medications prior to visit.  ? ? ?PAST MEDICAL  HISTORY: ?Past Medical History:  ?Diagnosis Date  ? Blind left eye   ? Deaf   ? Developmental delay   ? Diabetes mellitus without complication ([place])   ? Mental retardation   ? ? ?PAST SURGICAL HISTORY: ?Past Surgical History:  ?Procedure Laterality Date  ? None    ? ? ?FAMILY HISTORY: ?Family History  ?Problem Relation Age of Onset  ? Hypertension Mother   ? Arthritis Mother   ? Heart disease Mother   ? Diabetes Father   ? Hypertension Father   ? Mental retardation Neg Hx   ? ? ?SOCIAL HISTORY: ?Social History  ? ?Socioeconomic History  ? Marital status: Single  ?  Spouse name: Not on file  ? Number of children: Not on file  ? Years of education: Not on file  ? Highest education level: Not on file  ?Occupational History  ? Not on file  ?Tobacco Use  ? Smoking status: Never  ? Smokeless tobacco: Never  ?Vaping Use  ? Vaping Use: Never used  ?Substance and Sexual Activity  ? Alcohol use: No  ? Drug use: No  ? Sexual activity: Never  ?Other Topics Concern  ? Not on file  ?Social History Narrative  ? ** Merged History Encounter **  ?    ? Patient lives at home with her mom ([place]). ?Education- None ?Left handed. ?Caffeine- Some hot tea ? ?Patient Special Coordinator Terrilee Croak - [phone] -cell, [phone] and fax [phone].  ? ?Social Determinants of Health  ? ?Financial Resource Strain: Not on file  ?Food Insecurity: Not on file  ?Transportation Needs: Not on file  ?Physical Activity: Not on file  ?Stress: Not on file  ?Social Connections: Not on file  ?Intimate Partner Violence: Not on file  ? ? ? ? ?PHYSICAL EXAM ? ?There were no vitals filed for this visit. ? ?There is no height or weight on file to calculate BMI. ? ?Generalized: Well developed, in no acute distress  ? ?Neurological examination  ?Mentation: Alert.  Nonverbal ?Cranial nerve II-XII: Facial symmetry noted ?Motor: Good strength throughout ?Sensory: UTA ?Coordination: UTA ?Gait and station: feet turned out when ambulating ?Reflexes: UTA   ? ?DIAGNOSTIC DATA (LABS, IMAGING, TESTING) ?- I reviewed patient records, labs, notes, testing and imaging myself where available. ? ?Lab Results  ?Component Value Date  ? WBC 7.5 04/04/2021  ? HGB 11.6 04/04/2021  ? HCT 34.0 04/04/2021  ? MCV 89 04/04/2021  ? PLT 285 04/04/2021  ? ?   ?Component Value Date/Time  ? NA 137 04/04/2021 1132  ? K 4.6 04/04/2021 1132  ? CL 102 04/04/2021 1132  ? CO2 19 (L) 04/04/2021 1132  ? GLUCOSE 179 (H) 04/04/2021 1132  ? GLUCOSE 111 (H) 05/24/2020 1105  ? BUN 19 04/04/2021 1132  ? CREATININE 0.95 04/04/2021 1132  ? CREATININE 0.83 01/23/2016 1642  ?  CALCIUM 10.7 (H) 04/04/2021 1132  ? PROT 6.8 04/04/2021 1132  ? ALBUMIN 4.6 04/04/2021 1132  ? AST 8 04/04/2021 1132  ? ALT 11 04/04/2021 1132  ? ALKPHOS 56 04/04/2021 1132  ? BILITOT <0.2 04/04/2021 1132  ? GFRNONAA >60 05/24/2020 1105  ? [facility] >89 01/23/2016 1642  ? GFRAA 125 10/06/2019 1234  ? GFRAA >89 01/23/2016 1642  ? ?Lab Results  ?Component Value Date  ? CHOL 106 10/06/2019  ? HDL 49 10/06/2019  ? [place] 34 10/06/2019  ? TRIG 136 10/06/2019  ? CHOLHDL 2.2 10/06/2019  ? ?Lab Results  ?Component Value Date  ? HGBA1C 6.8 (H) 04/04/2021  ? ?No results found for: VITAMINB12 ?Lab Results  ?Component Value Date  ? TSH 3.311 05/24/2020  ? ? ? ? ?ASSESSMENT AND PLAN ?36 y.o. year old female  has a past medical history of Blind left eye, Deaf, Developmental delay, Diabetes mellitus without complication ([place]), and Mental retardation. here with: ? ?Developmental delay with behavioral issues ? ?Continue Risperdal 3 mg 1/2 tablet BID.  ?Return to PCP for ongoing follow-up. ?Follow-up with our office on an as-needed basis ? ? ? ?I spent 20 minutes of face-to-face and non-face-to-face time with patient.  This included previsit chart review, lab review, study review, order entry, electronic health record documentation, patient education. ? ?Ward Givens, MSN, NP-C 05/01/2021, 2:06 PM ?[facility] ?[address], [address] ?[place], [place] [address] ?[phone] ? ? ?

## 2021-05-20 ENCOUNTER — Other Ambulatory Visit: Payer: Self-pay | Admitting: Nurse Practitioner

## 2021-05-20 ENCOUNTER — Other Ambulatory Visit: Payer: Self-pay

## 2021-05-20 DIAGNOSIS — E119 Type 2 diabetes mellitus without complications: Secondary | ICD-10-CM

## 2021-05-20 DIAGNOSIS — I10 Essential (primary) hypertension: Secondary | ICD-10-CM

## 2021-05-20 DIAGNOSIS — E1165 Type 2 diabetes mellitus with hyperglycemia: Secondary | ICD-10-CM

## 2021-05-21 ENCOUNTER — Other Ambulatory Visit: Payer: Self-pay

## 2021-05-21 MED ORDER — METFORMIN HCL 500 MG PO TABS
1000.0000 mg | ORAL_TABLET | Freq: Two times a day (BID) | ORAL | 1 refills | Status: DC
  Filled 2021-05-21 – 2021-05-29 (×2): qty 360, 90d supply, fill #0
  Filled 2021-08-25: qty 360, 90d supply, fill #1

## 2021-05-21 MED ORDER — LISINOPRIL 5 MG PO TABS
ORAL_TABLET | Freq: Every day | ORAL | 1 refills | Status: DC
  Filled 2021-05-21 – 2021-07-08 (×4): qty 90, 90d supply, fill #0
  Filled 2021-10-02: qty 90, 90d supply, fill #1

## 2021-05-21 MED ORDER — ACCU-CHEK GUIDE VI STRP
ORAL_STRIP | 6 refills | Status: DC
  Filled 2021-05-21: qty 100, fill #0
  Filled 2021-06-18: qty 100, 30d supply, fill #0
  Filled 2021-07-08: qty 100, 25d supply, fill #0
  Filled 2021-08-05: qty 100, 33d supply, fill #1
  Filled 2021-10-02: qty 100, 33d supply, fill #2
  Filled 2021-11-28: qty 100, 33d supply, fill #3
  Filled 2021-12-24: qty 100, 33d supply, fill #4
  Filled 2022-02-11: qty 100, 33d supply, fill #5
  Filled 2022-03-16: qty 100, 33d supply, fill #6

## 2021-05-29 ENCOUNTER — Other Ambulatory Visit: Payer: Self-pay

## 2021-06-18 ENCOUNTER — Other Ambulatory Visit: Payer: Self-pay

## 2021-07-08 ENCOUNTER — Other Ambulatory Visit: Payer: Self-pay

## 2021-08-05 ENCOUNTER — Other Ambulatory Visit: Payer: Self-pay

## 2021-08-06 ENCOUNTER — Other Ambulatory Visit: Payer: Self-pay

## 2021-08-25 ENCOUNTER — Other Ambulatory Visit: Payer: Self-pay

## 2021-10-02 ENCOUNTER — Other Ambulatory Visit: Payer: Self-pay

## 2021-10-03 ENCOUNTER — Ambulatory Visit: Payer: Medicaid Other | Attending: Nurse Practitioner | Admitting: Nurse Practitioner

## 2021-10-03 ENCOUNTER — Encounter: Payer: Self-pay | Admitting: Nurse Practitioner

## 2021-10-03 ENCOUNTER — Other Ambulatory Visit: Payer: Self-pay

## 2021-10-03 VITALS — BP 113/82 | HR 100 | Temp 97.9°F | Ht 59.0 in | Wt 144.6 lb

## 2021-10-03 DIAGNOSIS — H9190 Unspecified hearing loss, unspecified ear: Secondary | ICD-10-CM | POA: Diagnosis not present

## 2021-10-03 DIAGNOSIS — Z7984 Long term (current) use of oral hypoglycemic drugs: Secondary | ICD-10-CM | POA: Diagnosis not present

## 2021-10-03 DIAGNOSIS — F79 Unspecified intellectual disabilities: Secondary | ICD-10-CM | POA: Insufficient documentation

## 2021-10-03 DIAGNOSIS — R6250 Unspecified lack of expected normal physiological development in childhood: Secondary | ICD-10-CM | POA: Insufficient documentation

## 2021-10-03 DIAGNOSIS — F919 Conduct disorder, unspecified: Secondary | ICD-10-CM | POA: Diagnosis not present

## 2021-10-03 DIAGNOSIS — Z1159 Encounter for screening for other viral diseases: Secondary | ICD-10-CM

## 2021-10-03 DIAGNOSIS — H5462 Unqualified visual loss, left eye, normal vision right eye: Secondary | ICD-10-CM | POA: Insufficient documentation

## 2021-10-03 DIAGNOSIS — Z23 Encounter for immunization: Secondary | ICD-10-CM | POA: Diagnosis not present

## 2021-10-03 DIAGNOSIS — Z79899 Other long term (current) drug therapy: Secondary | ICD-10-CM | POA: Diagnosis not present

## 2021-10-03 DIAGNOSIS — E1165 Type 2 diabetes mellitus with hyperglycemia: Secondary | ICD-10-CM | POA: Insufficient documentation

## 2021-10-03 DIAGNOSIS — E119 Type 2 diabetes mellitus without complications: Secondary | ICD-10-CM | POA: Diagnosis present

## 2021-10-03 LAB — POCT GLYCOSYLATED HEMOGLOBIN (HGB A1C): HbA1c POC (<> result, manual entry): 7.6 % (ref 4.0–5.6)

## 2021-10-03 MED ORDER — METFORMIN HCL 500 MG PO TABS
1000.0000 mg | ORAL_TABLET | Freq: Two times a day (BID) | ORAL | 1 refills | Status: DC
  Filled 2021-10-03 – 2021-11-28 (×2): qty 360, 90d supply, fill #0
  Filled 2022-03-06: qty 360, 90d supply, fill #1
  Filled 2022-03-16: qty 120, 30d supply, fill #1
  Filled 2022-04-15: qty 120, 30d supply, fill #2
  Filled 2022-05-11: qty 120, 30d supply, fill #3

## 2021-10-03 MED ORDER — LISINOPRIL 5 MG PO TABS
5.0000 mg | ORAL_TABLET | Freq: Every day | ORAL | 1 refills | Status: DC
  Filled 2021-10-03 – 2021-12-24 (×2): qty 90, 90d supply, fill #0
  Filled 2022-03-16: qty 90, 90d supply, fill #1

## 2021-10-03 MED ORDER — RISPERIDONE 3 MG PO TABS
ORAL_TABLET | ORAL | 3 refills | Status: DC
  Filled 2021-10-03: qty 60, fill #0
  Filled 2021-11-28: qty 60, 30d supply, fill #0
  Filled 2021-12-24: qty 60, 60d supply, fill #1

## 2021-10-03 NOTE — Progress Notes (Signed)
Assessment & Plan:  Makayla Roberts was seen today for diabetes.  Diagnoses and all orders for this visit:  Type 2 diabetes mellitus with hyperglycemia, without long-term current use of insulin  Mother requests to remove certain high carb foods and drinks from her diet prior to adding an additional diabetes medication -     POCT glycosylated hemoglobin (Hb A1C) -     Ambulatory referral to Ophthalmology -     CMP14+EGFR -     lisinopril (ZESTRIL) 5 MG tablet; Take 1 tablet (5 mg total) by mouth daily. -     metFORMIN (GLUCOPHAGE) 500 MG tablet; Take 2 tablets (1,000 mg total) by mouth 2 (two) times daily with a meal.  Need for hepatitis C screening test -     HCV Ab w Reflex to Quant PCR  Behavior disturbance -     risperiDONE (RISPERDAL) 3 MG tablet; TAKE 1/2 TABLET BY MOUTH 2 TIMES DAILY.  Need for Tdap vaccination -     Tdap vaccine greater than or equal to 7yo IM    Patient has been counseled on age-appropriate routine health concerns for screening and prevention. These are reviewed and up-to-date. Referrals have been placed accordingly. Immunizations are up-to-date or declined.    Subjective:   Chief Complaint  Patient presents with   Diabetes    Makayla Roberts 36 y.o. female presents to office today for follow up to DM.   PMH: Developmental delay with behavioral issues, DM2, Blind left eye, Deaf, mental retardation.  She is accompanied by her mother and older sister today VRI was used to communicate directly with patient for the entire encounter including providing detailed patient instructions.     DM 2 Diabetes is not controlled. Weight is up and A1c is now 7.6. She is taking metformin 1000 mg BID. Mother states Makayla Roberts has been demanding cappucino throughout the day.  She was previously drinking a cinnamon tea that was helping to keep her diabetes under control however she is no longer drinking this. LDL at goal. Lab Results  Component Value Date   HGBA1C 7.6 10/03/2021     Lab Results  Component Value Date   HGBA1C 6.8 (H) 04/04/2021    Lab Results  Component Value Date   LDLCALC 34 10/06/2019    Taking Risperdal as prescribed which seems to decrease angry outbursts.   Review of Systems  Constitutional:  Negative for fever, malaise/fatigue and weight loss.  HENT: Negative.  Negative for nosebleeds.   Eyes: Negative.  Negative for blurred vision, double vision and photophobia.  Respiratory: Negative.  Negative for cough and shortness of breath.   Cardiovascular: Negative.  Negative for chest pain, palpitations and leg swelling.  Gastrointestinal: Negative.  Negative for heartburn, nausea and vomiting.  Musculoskeletal: Negative.  Negative for myalgias.  Neurological: Negative.  Negative for dizziness, focal weakness, seizures and headaches.    Past Medical History:  Diagnosis Date   Blind left eye    Deaf    Developmental delay    Diabetes mellitus without complication ([place])    Mental retardation     Past Surgical History:  Procedure Laterality Date   None      Family History  Problem Relation Age of Onset   Hypertension Mother    Arthritis Mother    Heart disease Mother    Diabetes Father    Hypertension Father    Mental retardation Neg Hx     Social History Reviewed with no changes to be made today.  Outpatient Medications Prior to Visit  Medication Sig Dispense Refill   acetaminophen (TYLENOL) 325 MG tablet Take 2 tablets (650 mg total) by mouth every 6 (six) hours as needed for mild pain or headache (fever >/= 101).     Blood Glucose Monitoring Suppl (TRUE METRIX METER) w/Device KIT Use to check blood sugar TID. E11.9 1 kit 0   feeding supplement (ENSURE ENLIVE / ENSURE PLUS) LIQD Take 237 mLs by mouth 2 (two) times daily between meals. 237 mL 12   food thickener (THICK IT) POWD Use it to thicken liquids 284 g 0   glucose blood (ACCU-CHEK GUIDE) test strip Use 3 times daily to check blood sugar. 100 each 6   Melatonin 5 MG  CHEW Chew 5 mg by mouth at bedtime as needed (sleep). 90 tablet 3   guaiFENesin-dextromethorphan (ROBITUSSIN DM) 100-10 MG/5ML syrup Take 10 mLs by mouth every 4 (four) hours as needed for cough. 118 mL 0   lisinopril (ZESTRIL) 5 MG tablet TAKE 1 TABLET (5 MG TOTAL) BY MOUTH DAILY. 90 tablet 1   metFORMIN (GLUCOPHAGE) 500 MG tablet Take 2 tablets (1,000 mg total) by mouth 2 (two) times daily with a meal. 360 tablet 1   risperiDONE (RISPERDAL) 3 MG tablet TAKE 1/2 TABLET BY MOUTH 2 TIMES DAILY. 60 tablet 3   No facility-administered medications prior to visit.    Allergies  Allergen Reactions   Morphine And Related Shortness Of Breath       Objective:    BP 113/82   Pulse 100   Temp 97.9 F (36.6 C) (Oral)   Ht $R'4\' 11"'Yl$  (1.499 m)   Wt 144 lb 9.6 oz (65.6 kg)   SpO2 99%   BMI 29.21 kg/m  Wt Readings from Last 3 Encounters:  10/03/21 144 lb 9.6 oz (65.6 kg)  05/01/21 138 lb 9.6 oz (62.9 kg)  04/04/21 138 lb (62.6 kg)    Physical Exam Vitals and nursing note reviewed.  Constitutional:      Appearance: She is well-developed.  HENT:     Head: Normocephalic and atraumatic.  Eyes:     Comments: Blind left eye  Cardiovascular:     Rate and Rhythm: Normal rate and regular rhythm.     Heart sounds: Normal heart sounds. No murmur heard.    No friction rub. No gallop.  Pulmonary:     Effort: Pulmonary effort is normal. No tachypnea or respiratory distress.     Breath sounds: Normal breath sounds. No decreased breath sounds, wheezing, rhonchi or rales.  Chest:     Chest wall: No tenderness.  Abdominal:     General: Bowel sounds are normal.     Palpations: Abdomen is soft.  Musculoskeletal:        General: Normal range of motion.     Cervical back: Normal range of motion.  Skin:    General: Skin is warm and dry.  Neurological:     Mental Status: She is alert.     Coordination: Coordination normal.     Comments: NONVERBAL  Psychiatric:        Speech: She is  noncommunicative.        Behavior: Behavior is cooperative.          Patient has been counseled extensively about nutrition and exercise as well as the importance of adherence with medications and regular follow-up. The patient was given clear instructions to go to ER or return to medical center if symptoms don't improve, worsen or new  problems develop. The patient verbalized understanding.   Follow-up: Return in about 3 months (around 01/03/2022).   Gildardo Pounds, FNP-BC [facility] and [facility] [place], [place]   10/03/2021, 9:11 PM

## 2021-10-03 NOTE — Patient Instructions (Signed)
Morning readings 90-130  1-2 hours after meals your blood sugars should be less than 180

## 2021-10-04 LAB — CMP14+EGFR
ALT: 14 IU/L (ref 0–32)
AST: 12 IU/L (ref 0–40)
Albumin/Globulin Ratio: 1.7 (ref 1.2–2.2)
Albumin: 4.5 g/dL (ref 3.9–4.9)
Alkaline Phosphatase: 61 IU/L (ref 44–121)
BUN/Creatinine Ratio: 16 (ref 9–23)
BUN: 24 mg/dL — ABNORMAL HIGH (ref 6–20)
Bilirubin Total: <0.2 mg/dL (ref 0.0–1.2)
CO2: 18 mmol/L — ABNORMAL LOW (ref 20–29)
Calcium: 10 mg/dL (ref 8.7–10.2)
Chloride: 104 mmol/L (ref 96–106)
Creatinine, Ser: 1.51 mg/dL — ABNORMAL HIGH (ref 0.57–1.00)
Globulin, Total: 2.7 g/dL (ref 1.5–4.5)
Glucose: 131 mg/dL — ABNORMAL HIGH (ref 70–99)
Potassium: 5.8 mmol/L — ABNORMAL HIGH (ref 3.5–5.2)
Sodium: 137 mmol/L (ref 134–144)
Total Protein: 7.2 g/dL (ref 6.0–8.5)
eGFR: 45 mL/min/{1.73_m2} — ABNORMAL LOW (ref >59)

## 2021-10-04 LAB — HCV INTERPRETATION

## 2021-10-04 LAB — HCV AB W REFLEX TO QUANT PCR: HCV Ab: NONREACTIVE

## 2021-10-08 ENCOUNTER — Other Ambulatory Visit: Payer: Self-pay | Admitting: Nurse Practitioner

## 2021-10-08 DIAGNOSIS — R7989 Other specified abnormal findings of blood chemistry: Secondary | ICD-10-CM

## 2021-10-08 DIAGNOSIS — E875 Hyperkalemia: Secondary | ICD-10-CM

## 2021-10-09 NOTE — Progress Notes (Signed)
Interpreter Ahmed [phone]

## 2021-10-14 NOTE — Progress Notes (Signed)
Unable to contact pt. Letter mailed.

## 2021-10-27 ENCOUNTER — Other Ambulatory Visit: Payer: Self-pay

## 2021-10-27 MED ORDER — CEPHALEXIN 500 MG PO CAPS
ORAL_CAPSULE | ORAL | 0 refills | Status: DC
  Filled 2021-10-27: qty 21, 7d supply, fill #0

## 2021-10-27 MED ORDER — MUPIROCIN 2 % EX OINT
TOPICAL_OINTMENT | CUTANEOUS | 0 refills | Status: DC
  Filled 2021-10-27: qty 22, 7d supply, fill #0

## 2021-11-28 ENCOUNTER — Other Ambulatory Visit: Payer: Self-pay

## 2021-12-17 ENCOUNTER — Other Ambulatory Visit: Payer: Self-pay

## 2021-12-24 ENCOUNTER — Other Ambulatory Visit: Payer: Self-pay

## 2022-01-05 ENCOUNTER — Ambulatory Visit: Payer: Medicaid Other | Attending: Nurse Practitioner | Admitting: Nurse Practitioner

## 2022-01-05 ENCOUNTER — Encounter: Payer: Self-pay | Admitting: Nurse Practitioner

## 2022-01-05 ENCOUNTER — Other Ambulatory Visit: Payer: Self-pay

## 2022-01-05 VITALS — BP 140/92 | HR 109 | Temp 98.0°F | Ht 59.0 in | Wt 142.6 lb

## 2022-01-05 DIAGNOSIS — E1165 Type 2 diabetes mellitus with hyperglycemia: Secondary | ICD-10-CM | POA: Insufficient documentation

## 2022-01-05 DIAGNOSIS — R6250 Unspecified lack of expected normal physiological development in childhood: Secondary | ICD-10-CM | POA: Insufficient documentation

## 2022-01-05 DIAGNOSIS — H5462 Unqualified visual loss, left eye, normal vision right eye: Secondary | ICD-10-CM | POA: Diagnosis not present

## 2022-01-05 DIAGNOSIS — Z7984 Long term (current) use of oral hypoglycemic drugs: Secondary | ICD-10-CM | POA: Diagnosis not present

## 2022-01-05 DIAGNOSIS — F919 Conduct disorder, unspecified: Secondary | ICD-10-CM | POA: Diagnosis not present

## 2022-01-05 DIAGNOSIS — F79 Unspecified intellectual disabilities: Secondary | ICD-10-CM | POA: Insufficient documentation

## 2022-01-05 LAB — POCT GLYCOSYLATED HEMOGLOBIN (HGB A1C): HbA1c, POC (controlled diabetic range): 6.6 % (ref 0.0–7.0)

## 2022-01-05 MED ORDER — RISPERIDONE 3 MG PO TABS
3.0000 mg | ORAL_TABLET | Freq: Two times a day (BID) | ORAL | 3 refills | Status: DC
  Filled 2022-01-05 – 2022-02-11 (×2): qty 60, 30d supply, fill #0
  Filled 2022-02-11 – 2022-03-16 (×2): qty 60, 30d supply, fill #1
  Filled 2022-04-15: qty 60, 30d supply, fill #2
  Filled 2022-05-11: qty 60, 30d supply, fill #3

## 2022-01-05 NOTE — Progress Notes (Signed)
No concerns. 

## 2022-01-05 NOTE — Progress Notes (Signed)
Assessment & Plan:  Makayla Roberts was seen today for diabetes.  Diagnoses and all orders for this visit:  Type 2 diabetes mellitus with hyperglycemia, without long-term current use of insulin (HCC) -     POCT glycosylated hemoglobin (Hb A1C) -     CMP14+EGFR Continue blood sugar control as discussed in office today, low carbohydrate diet, and regular physical exercise as tolerated, 150 minutes per week (30 min each day, 5 days per week, or 50 min 3 days per week). Keep blood sugar logs with fasting goal of 90-130 mg/dl, post prandial (after you eat) less than 180.  For Hypoglycemia: BS <60 and Hyperglycemia BS >400; contact the clinic ASAP. Annual eye exams and foot exams are recommended.   Behavior disturbance -     risperiDONE (RISPERDAL) 3 MG tablet; Take 1 tablet (3 mg total) by mouth 2 (two) times daily.    Patient has been counseled on age-appropriate routine health concerns for screening and prevention. These are reviewed and up-to-date. Referrals have been placed accordingly. Immunizations are up-to-date or declined.    Subjective:   Chief Complaint  Patient presents with   Diabetes   HPI Makayla Roberts 36 y.o. female presents to office today for follow up to DM    PMH: Developmental delay with behavioral issues, DM2, Blind left eye, Deaf, mental retardation.   She is accompanied by her mother and older sister today VRI was used to communicate directly with patient for the entire encounter including providing detailed patient instructions.   She is currently taking risperidone 1.5 mg twice daily and her mother states she is having to stay up at night due to Makayla Roberts wandering and not sleeping as well as during the day she is experiencing hyperactivity.     DM 2 Diabetes has improved significantly due to dietary changes. Weight is down a few pounds as well. She is taking metformin 1000 mg BID. LDL at goal. Lab Results  Component Value Date   HGBA1C 6.6 01/05/2022    Lab  Results  Component Value Date   HGBA1C 7.6 10/03/2021    HTN We were unable to get Makayla Roberts to sit down during her visit today. It was very difficult to ascertain a true blood pressure reading. She continues on lisinopril 5 mg daily BP Readings from Last 3 Encounters:  01/05/22 (!) 140/92  10/03/21 113/82  05/01/21 128/86    Review of Systems  Constitutional:  Negative for fever, malaise/fatigue and weight loss.  HENT: Negative.  Negative for nosebleeds.   Eyes: Negative.  Negative for blurred vision, double vision and photophobia.  Respiratory: Negative.  Negative for cough and shortness of breath.   Cardiovascular: Negative.  Negative for chest pain, palpitations and leg swelling.  Gastrointestinal: Negative.  Negative for heartburn, nausea and vomiting.  Musculoskeletal: Negative.  Negative for myalgias.  Neurological: Negative.  Negative for dizziness, focal weakness, seizures and headaches.  Psychiatric/Behavioral:  Negative for suicidal ideas. The patient has insomnia.        SEE HPI    Past Medical History:  Diagnosis Date   Blind left eye    Deaf    Developmental delay    Diabetes mellitus without complication ([place])    Mental retardation     Past Surgical History:  Procedure Laterality Date   None      Family History  Problem Relation Age of Onset   Hypertension Mother    Arthritis Mother    Heart disease Mother    Diabetes Father  Hypertension Father    Mental retardation Neg Hx     Social History Reviewed with no changes to be made today.   Outpatient Medications Prior to Visit  Medication Sig Dispense Refill   acetaminophen (TYLENOL) 325 MG tablet Take 2 tablets (650 mg total) by mouth every 6 (six) hours as needed for mild pain or headache (fever >/= 101).     Blood Glucose Monitoring Suppl (TRUE METRIX METER) w/Device KIT Use to check blood sugar TID. E11.9 1 kit 0   feeding supplement (ENSURE ENLIVE / ENSURE PLUS) LIQD Take 237 mLs by mouth 2 (two)  times daily between meals. 237 mL 12   food thickener (THICK IT) POWD Use it to thicken liquids 284 g 0   glucose blood (ACCU-CHEK GUIDE) test strip Use 3 times daily to check blood sugar. 100 each 6   lisinopril (ZESTRIL) 5 MG tablet Take 1 tablet (5 mg total) by mouth daily. 90 tablet 1   Melatonin 5 MG CHEW Chew 5 mg by mouth at bedtime as needed (sleep). 90 tablet 3   metFORMIN (GLUCOPHAGE) 500 MG tablet Take 2 tablets (1,000 mg total) by mouth 2 (two) times daily with a meal. 360 tablet 1   cephALEXin (KEFLEX) 500 MG capsule Take 1 capsule (500 mg total) by mouth in the morning and 1 capsule (500 mg total) in the evening and 1 capsule (500 mg total) before bedtime. Do all this for 7 days. 21 capsule 0   mupirocin ointment (BACTROBAN) 2 % Apply topically onto affected area twice a day x 7 days as directed 22 g 0   risperiDONE (RISPERDAL) 3 MG tablet TAKE 1/2 TABLET BY MOUTH 2 TIMES DAILY. 60 tablet 3   No facility-administered medications prior to visit.    Allergies  Allergen Reactions   Morphine And Related Shortness Of Breath       Objective:    BP (!) 140/92   Pulse (!) 109   Temp 98 F (36.7 C) (Temporal)   Ht _0  (1.499 m)   Wt 142 lb 9.6 oz (64.7 kg)   SpO2 97%   BMI 28.80 kg/m  Wt Readings from Last 3 Encounters:  01/05/22 142 lb 9.6 oz (64.7 kg)  10/03/21 144 lb 9.6 oz (65.6 kg)  05/01/21 138 lb 9.6 oz (62.9 kg)    Physical Exam Vitals and nursing note reviewed.  Constitutional:      Appearance: She is well-developed.  HENT:     Head: Normocephalic and atraumatic.  Eyes:     Comments: Blind left eye  Cardiovascular:     Rate and Rhythm: Normal rate and regular rhythm.     Heart sounds: Normal heart sounds. No murmur heard.    No friction rub. No gallop.  Pulmonary:     Effort: Pulmonary effort is normal. No tachypnea or respiratory distress.     Breath sounds: Normal breath sounds. No decreased breath sounds, wheezing, rhonchi or rales.  Chest:      Chest wall: No tenderness.  Abdominal:     General: Bowel sounds are normal.     Palpations: Abdomen is soft.  Musculoskeletal:        General: Normal range of motion.     Cervical back: Normal range of motion.  Skin:    General: Skin is warm and dry.  Neurological:     Mental Status: She is alert and oriented to person, place, and time.     Coordination: Coordination normal.  Psychiatric:  Behavior: Behavior normal. Behavior is cooperative.        Thought Content: Thought content normal.        Judgment: Judgment normal.          Patient has been counseled extensively about nutrition and exercise as well as the importance of adherence with medications and regular follow-up. The patient was given clear instructions to go to ER or return to medical center if symptoms don't improve, worsen or new problems develop. The patient verbalized understanding.   Follow-up: Return in about 3 months (around 04/07/2022).   Gildardo Pounds, FNP-BC [facility] and [place], [place]   01/05/2022, 3:27 PM

## 2022-01-06 LAB — CMP14+EGFR
ALT: 14 IU/L (ref 0–32)
AST: 11 IU/L (ref 0–40)
Albumin/Globulin Ratio: 2 (ref 1.2–2.2)
Albumin: 4.6 g/dL (ref 3.9–4.9)
Alkaline Phosphatase: 57 IU/L (ref 44–121)
BUN/Creatinine Ratio: 15 (ref 9–23)
BUN: 14 mg/dL (ref 6–20)
Bilirubin Total: <0.2 mg/dL (ref 0.0–1.2)
CO2: 20 mmol/L (ref 20–29)
Calcium: 10.1 mg/dL (ref 8.7–10.2)
Chloride: 97 mmol/L (ref 96–106)
Creatinine, Ser: 0.95 mg/dL (ref 0.57–1.00)
Globulin, Total: 2.3 g/dL (ref 1.5–4.5)
Glucose: 153 mg/dL — ABNORMAL HIGH (ref 70–99)
Potassium: 4.6 mmol/L (ref 3.5–5.2)
Sodium: 134 mmol/L (ref 134–144)
Total Protein: 6.9 g/dL (ref 6.0–8.5)
eGFR: 79 mL/min/{1.73_m2} (ref >59)

## 2022-02-11 ENCOUNTER — Other Ambulatory Visit: Payer: Self-pay

## 2022-03-06 ENCOUNTER — Other Ambulatory Visit: Payer: Self-pay

## 2022-03-13 ENCOUNTER — Other Ambulatory Visit: Payer: Self-pay

## 2022-03-16 ENCOUNTER — Other Ambulatory Visit: Payer: Self-pay

## 2022-04-07 ENCOUNTER — Other Ambulatory Visit: Payer: Self-pay

## 2022-04-07 ENCOUNTER — Ambulatory Visit: Payer: Medicaid Other | Attending: Nurse Practitioner | Admitting: Nurse Practitioner

## 2022-04-07 ENCOUNTER — Encounter: Payer: Self-pay | Admitting: Nurse Practitioner

## 2022-04-07 VITALS — BP 141/87 | HR 122 | Ht 59.0 in | Wt 144.3 lb

## 2022-04-07 DIAGNOSIS — H5462 Unqualified visual loss, left eye, normal vision right eye: Secondary | ICD-10-CM | POA: Insufficient documentation

## 2022-04-07 DIAGNOSIS — Z79899 Other long term (current) drug therapy: Secondary | ICD-10-CM | POA: Insufficient documentation

## 2022-04-07 DIAGNOSIS — H9190 Unspecified hearing loss, unspecified ear: Secondary | ICD-10-CM | POA: Insufficient documentation

## 2022-04-07 DIAGNOSIS — I10 Essential (primary) hypertension: Secondary | ICD-10-CM

## 2022-04-07 DIAGNOSIS — Z7984 Long term (current) use of oral hypoglycemic drugs: Secondary | ICD-10-CM | POA: Insufficient documentation

## 2022-04-07 DIAGNOSIS — F79 Unspecified intellectual disabilities: Secondary | ICD-10-CM | POA: Diagnosis not present

## 2022-04-07 DIAGNOSIS — R32 Unspecified urinary incontinence: Secondary | ICD-10-CM | POA: Diagnosis not present

## 2022-04-07 DIAGNOSIS — E119 Type 2 diabetes mellitus without complications: Secondary | ICD-10-CM | POA: Diagnosis not present

## 2022-04-07 MED ORDER — LISINOPRIL 5 MG PO TABS
5.0000 mg | ORAL_TABLET | Freq: Every day | ORAL | 1 refills | Status: DC
  Filled 2022-04-07 – 2022-06-05 (×4): qty 90, 90d supply, fill #0
  Filled 2022-08-14 – 2022-08-18 (×2): qty 90, 90d supply, fill #1

## 2022-04-07 MED ORDER — MISC. DEVICES MISC: 99 refills | Status: AC

## 2022-04-07 NOTE — Progress Notes (Signed)
Assessment & Plan:  [name] was seen today for hypertension.  Diagnoses and all orders for this visit:  Primary hypertension -     lisinopril (ZESTRIL) 5 MG tablet; Take 1 tablet (5 mg total) by mouth daily. Please upload the next 2 weeks of blood pressure measurements from home readings to my chart   Incontinence in female -     Misc. Devices MISC; Please provide patient with medium size adult diapers and large disposable chux pads. ICD 10 R32.0     Patient has been counseled on age-appropriate routine health concerns for screening and prevention. These are reviewed and up-to-date. Referrals have been placed accordingly. Immunizations are up-to-date or declined.    Subjective:   Chief Complaint  Patient presents with   Hypertension    Makayla Roberts 37 y.o. female presents to office today for follow-up to hypertension.  She is accompanied by her sister-in-law who is speaking on her behalf.  PMH: Developmental delay with behavioral issues, DM2, Blind left eye, Deaf, mental retardation.   Blood pressure is elevated today however her sister-in-law reports normal pressures at home.  She is taking renal dose lisinopril 5 mg daily as prescribed for diabetes. BP Readings from Last 3 Encounters:  04/07/22 (!) 141/87  01/05/22 (!) 140/92  10/03/21 113/82    Incontinence Incontinence. This has been present for several years. She leaks urine with with urge, with a full bladder, during the night, any activity. Factors associated with symptoms include developmental delay since birth.     Review of Systems  Constitutional:  Negative for fever, malaise/fatigue and weight loss.  HENT: Negative.  Negative for nosebleeds.   Eyes: Negative.  Negative for blurred vision, double vision and photophobia.  Respiratory: Negative.  Negative for cough and shortness of breath.   Cardiovascular: Negative.  Negative for chest pain, palpitations and leg swelling.  Gastrointestinal: Negative.  Negative  for heartburn, nausea and vomiting.  Genitourinary:  Negative for flank pain and hematuria.       Incontinence  Musculoskeletal: Negative.  Negative for myalgias.  Neurological: Negative.  Negative for dizziness, focal weakness, seizures and headaches.  Psychiatric/Behavioral: Negative.  Negative for suicidal ideas.     Past Medical History:  Diagnosis Date   Blind left eye    Deaf    Developmental delay    Diabetes mellitus without complication ([place])    Mental retardation     Past Surgical History:  Procedure Laterality Date   None      Family History  Problem Relation Age of Onset   Hypertension Mother    Arthritis Mother    Heart disease Mother    Diabetes Father    Hypertension Father    Mental retardation Neg Hx     Social History Reviewed with no changes to be made today.   Outpatient Medications Prior to Visit  Medication Sig Dispense Refill   acetaminophen (TYLENOL) 325 MG tablet Take 2 tablets (650 mg total) by mouth every 6 (six) hours as needed for mild pain or headache (fever >/= 101).     Blood Glucose Monitoring Suppl (TRUE METRIX METER) w/Device KIT Use to check blood sugar TID. E11.9 1 kit 0   feeding supplement (ENSURE ENLIVE / ENSURE PLUS) LIQD Take 237 mLs by mouth 2 (two) times daily between meals. 237 mL 12   food thickener (THICK IT) POWD Use it to thicken liquids 284 g 0   glucose blood (ACCU-CHEK GUIDE) test strip Use 3 times daily to check  blood sugar. 100 each 6   Melatonin 5 MG CHEW Chew 5 mg by mouth at bedtime as needed (sleep). 90 tablet 3   metFORMIN (GLUCOPHAGE) 500 MG tablet Take 2 tablets (1,000 mg total) by mouth 2 (two) times daily with a meal. 360 tablet 1   risperiDONE (RISPERDAL) 3 MG tablet Take 1 tablet (3 mg total) by mouth 2 (two) times daily. 60 tablet 3   lisinopril (ZESTRIL) 5 MG tablet Take 1 tablet (5 mg total) by mouth daily. 90 tablet 1   No facility-administered medications prior to visit.    Allergies  Allergen  Reactions   Morphine And Related Shortness Of Breath       Objective:    BP (!) 141/87   Pulse (!) 122   Ht 4' 11"$  (1.499 m)   Wt 144 lb 4.8 oz (65.5 kg)   SpO2 98%   BMI 29.15 kg/m  Wt Readings from Last 3 Encounters:  04/07/22 144 lb 4.8 oz (65.5 kg)  01/05/22 142 lb 9.6 oz (64.7 kg)  10/03/21 144 lb 9.6 oz (65.6 kg)    Physical Exam Vitals and nursing note reviewed.  Constitutional:      Appearance: She is well-developed.  HENT:     Head: Normocephalic and atraumatic.  Cardiovascular:     Rate and Rhythm: Regular rhythm. Tachycardia present.     Heart sounds: Normal heart sounds. No murmur heard.    No friction rub. No gallop.  Pulmonary:     Effort: Pulmonary effort is normal. No tachypnea or respiratory distress.     Breath sounds: Normal breath sounds. No decreased breath sounds, wheezing, rhonchi or rales.  Chest:     Chest wall: No tenderness.  Abdominal:     General: Bowel sounds are normal.     Palpations: Abdomen is soft.  Musculoskeletal:        General: Normal range of motion.     Cervical back: Normal range of motion.  Skin:    General: Skin is warm and dry.  Neurological:     Mental Status: She is alert and oriented to person, place, and time.     Coordination: Coordination normal.  Psychiatric:        Behavior: Behavior is cooperative.          Patient has been counseled extensively about nutrition and exercise as well as the importance of adherence with medications and regular follow-up. The patient was given clear instructions to go to ER or return to medical center if symptoms don't improve, worsen or new problems develop. The patient verbalized understanding.   Follow-up: Return in about 3 months (around 07/06/2022) for DM.   Gildardo Pounds, FNP-BC [facility] and [facility] [place], [place]   04/07/2022, 2:00 PM

## 2022-04-07 NOTE — Progress Notes (Signed)
Sophia- sister-inlaw

## 2022-04-15 ENCOUNTER — Telehealth: Payer: Self-pay | Admitting: Nurse Practitioner

## 2022-04-15 ENCOUNTER — Other Ambulatory Visit: Payer: Self-pay | Admitting: Family Medicine

## 2022-04-15 ENCOUNTER — Other Ambulatory Visit: Payer: Self-pay

## 2022-04-15 DIAGNOSIS — E119 Type 2 diabetes mellitus without complications: Secondary | ICD-10-CM

## 2022-04-15 MED ORDER — ACCU-CHEK GUIDE VI STRP
ORAL_STRIP | 0 refills | Status: DC
  Filled 2022-04-15 – 2022-05-01 (×2): qty 100, 33d supply, fill #0

## 2022-04-15 NOTE — Telephone Encounter (Signed)
Routing to CMA 

## 2022-04-15 NOTE — Telephone Encounter (Signed)
Requested Prescriptions  Pending Prescriptions Disp Refills   glucose blood (ACCU-CHEK GUIDE) test strip 100 each 0    Sig: Use 3 times daily to check blood sugar.     Endocrinology: Diabetes - Testing Supplies Passed - 04/15/2022 11:44 AM      Passed - Valid encounter within last 12 months    Recent Outpatient Visits           1 week ago Primary hypertension   [place], [place] W, NP   3 months ago Type 2 diabetes mellitus with hyperglycemia, without long-term current use of insulin [facility])   [place], [place] W, NP   6 months ago Type 2 diabetes mellitus with hyperglycemia, without long-term current use of insulin [facility])   [place], [place] W, NP   1 year ago Type 2 diabetes mellitus without complication, without long-term current use of insulin [facility])   [place], Vern[place] Buff, NP   2 years ago Essential hypertension   [place], Dionne Bu[place], [place]       Future Appointments             In 2 months Gildardo Pounds, NP [place]

## 2022-04-15 NOTE — Telephone Encounter (Signed)
Makayla Roberts is calling in regards to pt incontinence supplies. Pt was suppose to be getting diapers, gloves, and other supplies. Per Makayla Roberts the place where the supplies are coming from is stating they need the paper work sent in from PCP to send the supplies to the pt. Please advise.

## 2022-04-16 NOTE — Telephone Encounter (Signed)
Paperwork refaxed.

## 2022-04-17 NOTE — Telephone Encounter (Signed)
Beverlee Nims from [employer] states for whatever reason they did not get the refax on the orders and if they can be faxed again to [phone]

## 2022-04-17 NOTE — Telephone Encounter (Signed)
Paperwork refaxed.

## 2022-04-21 ENCOUNTER — Other Ambulatory Visit: Payer: Self-pay

## 2022-04-24 ENCOUNTER — Other Ambulatory Visit: Payer: Self-pay

## 2022-05-01 ENCOUNTER — Other Ambulatory Visit: Payer: Self-pay

## 2022-05-11 ENCOUNTER — Encounter: Payer: Self-pay | Admitting: Nurse Practitioner

## 2022-05-11 ENCOUNTER — Other Ambulatory Visit: Payer: Self-pay

## 2022-05-12 ENCOUNTER — Encounter (HOSPITAL_BASED_OUTPATIENT_CLINIC_OR_DEPARTMENT_OTHER): Payer: Self-pay

## 2022-05-12 ENCOUNTER — Other Ambulatory Visit: Payer: Self-pay

## 2022-05-12 ENCOUNTER — Emergency Department (HOSPITAL_BASED_OUTPATIENT_CLINIC_OR_DEPARTMENT_OTHER)
Admission: EM | Admit: 2022-05-12 | Discharge: 2022-05-13 | Disposition: A | Discharge status: Home / Self Care | Payer: Medicaid Other | Attending: Emergency Medicine | Admitting: Emergency Medicine

## 2022-05-12 DIAGNOSIS — N289 Disorder of kidney and ureter, unspecified: Secondary | ICD-10-CM | POA: Diagnosis not present

## 2022-05-12 DIAGNOSIS — R799 Abnormal finding of blood chemistry, unspecified: Secondary | ICD-10-CM | POA: Diagnosis not present

## 2022-05-12 DIAGNOSIS — Z7984 Long term (current) use of oral hypoglycemic drugs: Secondary | ICD-10-CM | POA: Diagnosis not present

## 2022-05-12 DIAGNOSIS — D75839 Thrombocytosis, unspecified: Secondary | ICD-10-CM | POA: Insufficient documentation

## 2022-05-12 DIAGNOSIS — Z79899 Other long term (current) drug therapy: Secondary | ICD-10-CM | POA: Insufficient documentation

## 2022-05-12 DIAGNOSIS — J101 Influenza due to other identified influenza virus with other respiratory manifestations: Secondary | ICD-10-CM | POA: Insufficient documentation

## 2022-05-12 DIAGNOSIS — E119 Type 2 diabetes mellitus without complications: Secondary | ICD-10-CM | POA: Diagnosis not present

## 2022-05-12 DIAGNOSIS — D649 Anemia, unspecified: Secondary | ICD-10-CM | POA: Diagnosis not present

## 2022-05-12 DIAGNOSIS — R059 Cough, unspecified: Secondary | ICD-10-CM | POA: Diagnosis present

## 2022-05-12 NOTE — ED Triage Notes (Signed)
Pt is nonverbal and caregiver reports that pt tested + for Flu B last week and symptoms have not improved. She denies fever, but reports persistent cough and congestion. Caregiver also concerned that pt has had decreased p.o. intake.

## 2022-05-13 ENCOUNTER — Emergency Department (HOSPITAL_BASED_OUTPATIENT_CLINIC_OR_DEPARTMENT_OTHER): Payer: Medicaid Other

## 2022-05-13 LAB — CBC WITH DIFFERENTIAL/PLATELET
Abs Immature Granulocytes: 0.02 10*3/uL (ref 0.00–0.07)
Basophils Absolute: 0 10*3/uL (ref 0.0–0.1)
Basophils Relative: 0 %
Eosinophils Absolute: 0.1 10*3/uL (ref 0.0–0.5)
Eosinophils Relative: 1 %
HCT: 31.9 % — ABNORMAL LOW (ref 36.0–46.0)
Hemoglobin: 10.3 g/dL — ABNORMAL LOW (ref 12.0–15.0)
Immature Granulocytes: 0 %
Lymphocytes Relative: 36 %
Lymphs Abs: 2.9 10*3/uL (ref 0.7–4.0)
MCH: 29.6 pg (ref 26.0–34.0)
MCHC: 32.3 g/dL (ref 30.0–36.0)
MCV: 91.7 fL (ref 80.0–100.0)
Monocytes Absolute: 0.4 10*3/uL (ref 0.1–1.0)
Monocytes Relative: 5 %
Neutro Abs: 4.6 10*3/uL (ref 1.7–7.7)
Neutrophils Relative %: 58 %
Platelets: 408 10*3/uL — ABNORMAL HIGH (ref 150–400)
RBC: 3.48 MIL/uL — ABNORMAL LOW (ref 3.87–5.11)
RDW: 13.2 % (ref 11.5–15.5)
WBC: 8.1 10*3/uL (ref 4.0–10.5)
nRBC: 0 % (ref 0.0–0.2)

## 2022-05-13 LAB — BASIC METABOLIC PANEL
Anion gap: 10 (ref 5–15)
BUN: 30 mg/dL — ABNORMAL HIGH (ref 6–20)
CO2: 20 mmol/L — ABNORMAL LOW (ref 22–32)
Calcium: 9.3 mg/dL (ref 8.9–10.3)
Chloride: 107 mmol/L (ref 98–111)
Creatinine, Ser: 1.18 mg/dL — ABNORMAL HIGH (ref 0.44–1.00)
GFR, Estimated: >60 mL/min (ref >60)
Glucose, Bld: 207 mg/dL — ABNORMAL HIGH (ref 70–99)
Potassium: 5.5 mmol/L — ABNORMAL HIGH (ref 3.5–5.1)
Sodium: 137 mmol/L (ref 135–145)

## 2022-05-13 LAB — URINALYSIS, W/ REFLEX TO CULTURE (INFECTION SUSPECTED)
Bilirubin Urine: NEGATIVE
Glucose, UA: NEGATIVE mg/dL
Hgb urine dipstick: NEGATIVE
Ketones, ur: NEGATIVE mg/dL
Leukocytes,Ua: NEGATIVE
Nitrite: NEGATIVE
Protein, ur: NEGATIVE mg/dL
Specific Gravity, Urine: >=1.03 (ref 1.005–1.030)
pH: 5.5 (ref 5.0–8.0)

## 2022-05-13 NOTE — ED Provider Notes (Incomplete)
[facility] [place] Provider Note   CSN: [id number] Arrival date & time: 05/12/22  2112     History {Add pertinent medical, surgical, social history, OB history to HPI:1} Chief Complaint  Patient presents with  . Cough    Makayla Roberts is a 37 y.o. female.  The history is provided by a caregiver. The history is limited by the condition of the patient (Patient is nonverbal).  Cough She has history of diabetes, blindness in the left eye, deafness, developmental delay   Home Medications Prior to Admission medications   Medication Sig Start Date End Date Taking? Authorizing Provider  acetaminophen (TYLENOL) 325 MG tablet Take 2 tablets (650 mg total) by mouth every 6 (six) hours as needed for mild pain or headache (fever >/= 101). 02/23/20   Blain Pais, MD  Blood Glucose Monitoring Suppl (TRUE METRIX METER) w/Device KIT Use to check blood sugar TID. E11.9 03/06/20   Charlott Rakes, MD  feeding supplement (ENSURE ENLIVE / ENSURE PLUS) LIQD Take 237 mLs by mouth 2 (two) times daily between meals. 04/04/21   Gildardo Pounds, NP  food thickener (THICK IT) POWD Use it to thicken liquids 04/04/21   Gildardo Pounds, NP  glucose blood (ACCU-CHEK GUIDE) test strip Use 3 times daily to check blood sugar. 04/15/22   Charlott Rakes, MD  lisinopril (ZESTRIL) 5 MG tablet Take 1 tablet (5 mg total) by mouth daily. 04/07/22   Gildardo Pounds, NP  Melatonin 5 MG CHEW Chew 5 mg by mouth at bedtime as needed (sleep). 04/04/21   Gildardo Pounds, NP  metFORMIN (GLUCOPHAGE) 500 MG tablet Take 2 tablets (1,000 mg total) by mouth 2 (two) times daily with a meal. 10/03/21 10/03/22  Gildardo Pounds, NP  Misc. Devices MISC Please provide patient with medium size adult diapers and large disposable chux pads. ICD 10 R32.0 04/07/22   Gildardo Pounds, NP  risperiDONE (RISPERDAL) 3 MG tablet Take 1 tablet (3 mg total) by mouth 2 (two) times daily. 01/05/22   Gildardo Pounds,  NP      Allergies    Morphine and related    Review of Systems   Review of Systems  Unable to perform ROS: Patient nonverbal  Respiratory:  Positive for cough.     Physical Exam Updated Vital Signs BP 108/75   Pulse 97   Temp 97.6 F (36.4 C) (Tympanic)   Resp 18   Ht 4\' 11"  (1.499 m)   Wt 68 kg   LMP  (Approximate)   SpO2 97%   BMI 30.30 kg/m  Physical Exam Vitals and nursing note reviewed.   37 year old female, resting comfortably and in no acute distress. Vital signs are ***. Oxygen saturation is ***%, which is normal. Head is normocephalic and atraumatic. PERRLA, EOMI. Oropharynx is clear. Neck is nontender and supple without adenopathy or JVD. Back is nontender and there is no CVA tenderness. Lungs are clear without rales, wheezes, or rhonchi. Chest is nontender. Heart has regular rate and rhythm without murmur. Abdomen is soft, flat, nontender without masses or hepatosplenomegaly and peristalsis is normoactive. Extremities have no cyanosis or edema, full range of motion is present. Skin is warm and dry without rash. Neurologic: Mental status is normal, cranial nerves are intact, there are no motor or sensory deficits.  ED Results / Procedures / Treatments   Labs (all labs ordered are listed, but only abnormal results are displayed) Labs Reviewed - No  data to display  EKG None  Radiology No results found.  Procedures Procedures  {Document cardiac monitor, telemetry assessment procedure when appropriate:1}  Medications Ordered in ED Medications - No data to display  ED Course/ Medical Decision Making/ A&P   {   Click here for ABCD2, HEART and other calculatorsREFRESH Note before signing :1}                          Medical Decision Making  ***  {Document critical care time when appropriate:1} {Document review of labs and clinical decision tools ie heart score, Chads2Vasc2 etc:1}  {Document your independent review of radiology images, and any  outside records:1} {Document your discussion with family members, caretakers, and with consultants:1} {Document social determinants of health affecting pt's care:1} {Document your decision making why or why not admission, treatments were needed:1} Final Clinical Impression(s) / ED Diagnoses Final diagnoses:  None    Rx / DC Orders ED Discharge Orders     None

## 2022-05-13 NOTE — Discharge Instructions (Signed)
Her x-ray shows no sign of pneumonia.  Her blood work suggest that she might be slightly dehydrated.  Please encourage her to drink more fluids.  She also has a chronic anemia which is a little worse than it has been.  Please follow-up with her primary care provider to decide whether she should be on iron supplements.  Return if she develops any new or concerning symptoms.

## 2022-05-13 NOTE — ED Provider Notes (Signed)
[place] [facility] Provider Note   CSN: [id number] Arrival date & time: 05/12/22  2112     History  Chief Complaint  Patient presents with   Cough    Makayla Roberts is a 37 y.o. female.  The history is provided by a caregiver. The history is limited by the condition of the patient (Patient is nonverbal).  Cough She has history of diabetes, blindness in the left eye, deafness, developmental delay and has been sick with what was diagnosed as influenza B.  She started getting sick 1 week ago with fever and cough and rhinorrhea.  Fever has resolved.  She did go to an urgent care where she did test positive for influenza B.  Caregivers are concerned that she is not improving the way she should.  Her appetite is decreased although she is taking fluids well.  They also feel that the patient is acting as if she is dizzy.  There has been a persistent cough but she swallows her sputum.  There has been no vomiting or diarrhea.   Home Medications Prior to Admission medications   Medication Sig Start Date End Date Taking? Authorizing Provider  acetaminophen (TYLENOL) 325 MG tablet Take 2 tablets (650 mg total) by mouth every 6 (six) hours as needed for mild pain or headache (fever >/= 101). 02/23/20   Blain Pais, MD  Blood Glucose Monitoring Suppl (TRUE METRIX METER) w/Device KIT Use to check blood sugar TID. E11.9 03/06/20   Charlott Rakes, MD  feeding supplement (ENSURE ENLIVE / ENSURE PLUS) LIQD Take 237 mLs by mouth 2 (two) times daily between meals. 04/04/21   Gildardo Pounds, NP  food thickener (THICK IT) POWD Use it to thicken liquids 04/04/21   Gildardo Pounds, NP  glucose blood (ACCU-CHEK GUIDE) test strip Use 3 times daily to check blood sugar. 04/15/22   Charlott Rakes, MD  lisinopril (ZESTRIL) 5 MG tablet Take 1 tablet (5 mg total) by mouth daily. 04/07/22   Gildardo Pounds, NP  Melatonin 5 MG CHEW Chew 5 mg by mouth at bedtime as needed  (sleep). 04/04/21   Gildardo Pounds, NP  metFORMIN (GLUCOPHAGE) 500 MG tablet Take 2 tablets (1,000 mg total) by mouth 2 (two) times daily with a meal. 10/03/21 10/03/22  Gildardo Pounds, NP  Misc. Devices MISC Please provide patient with medium size adult diapers and large disposable chux pads. ICD 10 R32.0 04/07/22   Gildardo Pounds, NP  risperiDONE (RISPERDAL) 3 MG tablet Take 1 tablet (3 mg total) by mouth 2 (two) times daily. 01/05/22   Gildardo Pounds, NP      Allergies    Morphine and related    Review of Systems   Review of Systems  Unable to perform ROS: Patient nonverbal  Respiratory:  Positive for cough.     Physical Exam Updated Vital Signs BP 108/75   Pulse 97   Temp 97.6 F (36.4 C) (Tympanic)   Resp 18   Ht 4\' 11"  (1.499 m)   Wt 68 kg   LMP  (Approximate)   SpO2 97%   BMI 30.30 kg/m  Physical Exam Vitals and nursing note reviewed.   37 year old female, resting comfortably and in no acute distress. Vital signs are normal. Oxygen saturation is 97%, which is normal. Head is normocephalic and atraumatic.  Right pupil is round and reactive, left eye is scarred.  Mucous membranes are moist. Neck is nontender and supple  without adenopathy or JVD. Back is nontender and there is no CVA tenderness. Lungs are clear without rales, wheezes, or rhonchi. Chest is nontender. Heart has regular rate and rhythm without murmur. Abdomen is soft, flat, nontender. Extremities have no cyanosis or edema, full range of motion is present. Skin is warm and dry without rash. Neurologic: Awake and alert but nonverbal, ambulatory, moves all extremities equally.  ED Results / Procedures / Treatments   Labs (all labs ordered are listed, but only abnormal results are displayed) Labs Reviewed  BASIC METABOLIC PANEL - Abnormal; Notable for the following components:      Result Value   Potassium 5.5 (*)    CO2 20 (*)    Glucose, Bld 207 (*)    BUN 30 (*)    Creatinine, Ser 1.18 (*)     All other components within normal limits  CBC WITH DIFFERENTIAL/PLATELET - Abnormal; Notable for the following components:   RBC 3.48 (*)    Hemoglobin 10.3 (*)    HCT 31.9 (*)    Platelets 408 (*)    All other components within normal limits  URINALYSIS, W/ REFLEX TO CULTURE (INFECTION SUSPECTED) - Abnormal; Notable for the following components:   Bacteria, UA RARE (*)    All other components within normal limits   Radiology DG Chest 2 View  Result Date: 05/13/2022 CLINICAL DATA:  Cough and recent flu diagnosis EXAM: CHEST - 2 VIEW COMPARISON:  02/18/2020 FINDINGS: Cardiac shadow is mildly prominent. Lungs are well aerated bilaterally. No focal infiltrate or effusion is seen. Mild central vascular congestion is noted. No bony abnormality is seen. IMPRESSION: Mild central vascular prominence. No other focal abnormality is noted. Electronically Signed   By: Inez Catalina M.D.   On: 05/13/2022 00:39    Procedures Procedures    Medications Ordered in ED Medications - No data to display  ED Course/ Medical Decision Making/ A&P                             Medical Decision Making Amount and/or Complexity of Data Reviewed Labs: ordered. Radiology: ordered.   Recent infection with influenza B and persistent cough and anorexia and dizziness.  I have ordered chest x-ray to rule out pneumonia, CBC and basic metabolic panel to rule out electrolyte disturbance.  Have also ordered a urinalysis which will need to be obtained by catheterization.  At this point, no indication for testing for respiratory pathogens as she is outside the treatment window for all viral respiratory pathogens.  I have reviewed and interpreted her laboratory test, and my interpretation is no evidence of urinary tract infection, anemia which is slightly worse than most recent lab work but in range that she has been at previously, mild elevation of BUN and creatinine which are likely from relative dehydration.  She has also  had worse renal insufficiency in the past.  Chest x-ray shows no evidence of pneumonia.  Have independently viewed the images, and agree with the radiologist's interpretation.  I have told her caregivers to encourage increased oral fluid intake, follow-up with primary care provider regarding anemia.  Return precautions discussed.  Final Clinical Impression(s) / ED Diagnoses Final diagnoses:  Influenza B  Normochromic normocytic anemia  Thrombocytosis  Renal insufficiency    Rx / DC Orders ED Discharge Orders     None         Delora Fuel, MD [phone]

## 2022-05-13 NOTE — ED Notes (Signed)
Patient transported to X-ray 

## 2022-05-13 NOTE — ED Notes (Signed)
In and out cath performed with additional assistance from staff x1. Family x2 at bedside. Tolerated without difficulty.

## 2022-05-25 ENCOUNTER — Other Ambulatory Visit: Payer: Self-pay

## 2022-05-25 ENCOUNTER — Other Ambulatory Visit: Payer: Self-pay | Admitting: Family Medicine

## 2022-05-25 ENCOUNTER — Other Ambulatory Visit: Payer: Self-pay | Admitting: Nurse Practitioner

## 2022-05-25 DIAGNOSIS — F919 Conduct disorder, unspecified: Secondary | ICD-10-CM

## 2022-05-25 DIAGNOSIS — E1165 Type 2 diabetes mellitus with hyperglycemia: Secondary | ICD-10-CM

## 2022-05-25 DIAGNOSIS — E119 Type 2 diabetes mellitus without complications: Secondary | ICD-10-CM

## 2022-05-26 ENCOUNTER — Other Ambulatory Visit: Payer: Self-pay

## 2022-05-26 MED ORDER — METFORMIN HCL 500 MG PO TABS
1000.0000 mg | ORAL_TABLET | Freq: Two times a day (BID) | ORAL | 1 refills | Status: DC
  Filled 2022-05-26 – 2022-06-08 (×2): qty 360, 90d supply, fill #0
  Filled 2022-08-14 – 2022-08-18 (×2): qty 360, 90d supply, fill #1

## 2022-05-26 MED ORDER — RISPERIDONE 3 MG PO TABS
3.0000 mg | ORAL_TABLET | Freq: Two times a day (BID) | ORAL | 1 refills | Status: DC
  Filled 2022-05-26 – 2022-06-08 (×2): qty 60, 30d supply, fill #0
  Filled 2022-07-15: qty 60, 30d supply, fill #1

## 2022-05-26 MED ORDER — ACCU-CHEK GUIDE VI STRP
1.0000 | ORAL_STRIP | Freq: Three times a day (TID) | 1 refills | Status: AC
  Filled 2022-05-26 – 2022-06-08 (×2): qty 100, 34d supply, fill #0
  Filled 2022-07-06: qty 100, 34d supply, fill #1
  Filled 2022-08-14: qty 100, 34d supply, fill #2
  Filled 2022-09-11: qty 100, 34d supply, fill #3
  Filled 2022-11-24: qty 100, 34d supply, fill #4

## 2022-05-26 NOTE — Telephone Encounter (Signed)
Requested Prescriptions  Pending Prescriptions Disp Refills   glucose blood (ACCU-CHEK GUIDE) test strip 300 each 1    Sig: Use 3 times daily to check blood sugar.     Endocrinology: Diabetes - Testing Supplies Passed - 05/25/2022  5:10 PM      Passed - Valid encounter within last 12 months    Recent Outpatient Visits           1 month ago Primary hypertension   [place] [facility] [place], [place] W, NP   4 months ago Type 2 diabetes mellitus with hyperglycemia, without long-term current use of insulin [facility])   [place] [facility] [place], [place] W, NP   7 months ago Type 2 diabetes mellitus with hyperglycemia, without long-term current use of insulin [facility])   [place] [facility] [place], [place] W, NP   1 year ago Type 2 diabetes mellitus without complication, without long-term current use of insulin [facility])   [place] [facility] [place], Shea Stakes, NP   2 years ago Essential hypertension   [facility] [place], Marzella [place]lein, [place]       Future Appointments             In 1 month Claiborne Rigg, NP [facility]

## 2022-05-26 NOTE — Telephone Encounter (Signed)
Requested Prescriptions  Pending Prescriptions Disp Refills   metFORMIN (GLUCOPHAGE) 500 MG tablet 360 tablet 1    Sig: Take 2 tablets (1,000 mg total) by mouth 2 (two) times daily with a meal.     Endocrinology:  Diabetes - Biguanides Failed - 05/25/2022  5:10 PM      Failed - Cr in normal range and within 360 days    Creat  Date Value Ref Range Status  01/23/2016 0.83 0.50 - 1.10 mg/dL Final   Creatinine, Ser  Date Value Ref Range Status  05/13/2022 1.18 (H) 0.44 - 1.00 mg/dL Final   Creatinine, Urine  Date Value Ref Range Status  01/23/2016 231 20 - 320 mg/dL Final         Failed - B12 Level in normal range and within 720 days    No results found for: "VITAMINB12"       Failed - CBC within normal limits and completed in the last 12 months    WBC  Date Value Ref Range Status  05/13/2022 8.1 4.0 - 10.5 K/uL Final   RBC  Date Value Ref Range Status  05/13/2022 3.48 (L) 3.87 - 5.11 MIL/uL Final   Hemoglobin  Date Value Ref Range Status  05/13/2022 10.3 (L) 12.0 - 15.0 g/dL Final  74/16/3845 36.4 11.1 - 15.9 g/dL Final   HCT  Date Value Ref Range Status  05/13/2022 31.9 (L) 36.0 - 46.0 % Final   Hematocrit  Date Value Ref Range Status  04/04/2021 34.0 34.0 - 46.6 % Final   MCHC  Date Value Ref Range Status  05/13/2022 32.3 30.0 - 36.0 g/dL Final   [facility]  Date Value Ref Range Status  05/13/2022 29.6 26.0 - 34.0 pg Final   MCV  Date Value Ref Range Status  05/13/2022 91.7 80.0 - 100.0 fL Final  04/04/2021 89 79 - 97 fL Final   No results found for: "PLTCOUNTKUC", "LABPLAT", "POCPLA" RDW  Date Value Ref Range Status  05/13/2022 13.2 11.5 - 15.5 % Final  04/04/2021 13.5 11.7 - 15.4 % Final         Passed - HBA1C is between 0 and 7.9 and within 180 days    HbA1c, POC (controlled diabetic range)  Date Value Ref Range Status  01/05/2022 6.6 0.0 - 7.0 % Final         Passed - eGFR in normal range and within 360 days    GFR, Est African American  Date Value  Ref Range Status  01/23/2016 >89 >=60 mL/min Final   GFR calc Af Amer  Date Value Ref Range Status  10/06/2019 125 >59 mL/min/1.73 Final    Comment:    **[facility] currently reports eGFR in compliance with the current**   recommendations of the [employer]. [facility] will   update reporting as new guidelines are published from the NKF-ASN   Task force.    GFR, Est Non African American  Date Value Ref Range Status  01/23/2016 >89 >=60 mL/min Final   GFR, Estimated  Date Value Ref Range Status  05/13/2022 >60 >60 mL/min Final    Comment:    (NOTE) Calculated using the CKD-EPI Creatinine Equation (2021)    eGFR  Date Value Ref Range Status  01/05/2022 79 >59 mL/min/1.73 Final         Passed - Valid encounter within last 6 months    Recent Outpatient Visits           1 month ago Primary hypertension  [facility] [place], [place] W, NP   4 months ago Type 2 diabetes mellitus with hyperglycemia, without long-term current use of insulin [facility])   [place] [facility] [place], [place] W, NP   7 months ago Type 2 diabetes mellitus with hyperglycemia, without long-term current use of insulin [facility])   [facility] [place], [place] W, NP   1 year ago Type 2 diabetes mellitus without complication, without long-term current use of insulin [facility])   [place] [facility] [place], Shea Stakes, NP   2 years ago Essential hypertension   [place] [facility] [place], [place], [place]       Future Appointments             In 1 month Claiborne Rigg, NP [facility]             risperiDONE (RISPERDAL) 3 MG tablet 60 tablet 3    Sig: Take 1 tablet (3 mg total) by mouth 2 (two) times daily.     Not Delegated - Psychiatry:  Antipsychotics - Second Generation (Atypical) - risperidone Failed - 05/25/2022  5:10  PM      Failed - This refill cannot be delegated      Failed - TSH in normal range and within 360 days    TSH  Date Value Ref Range Status  05/24/2020 3.311 0.350 - 4.500 uIU/mL Final    Comment:    Performed by a 3rd Generation assay with a functional sensitivity of <=0.01 uIU/mL. Performed at [facility], [address] [place], [place] [address]   01/23/2016 3.08 mIU/L Final    Comment:      Reference Range   > or = 20 Years  0.40-4.50   Pregnancy Range First trimester  0.26-2.66 Second trimester 0.55-2.73 Third trimester  0.43-2.91            Failed - Lipid Panel in normal range within the last 12 months    Cholesterol, Total  Date Value Ref Range Status  10/06/2019 106 100 - 199 mg/dL Final   LDL Chol Calc (NIH)  Date Value Ref Range Status  10/06/2019 34 0 - 99 mg/dL Final   HDL  Date Value Ref Range Status  10/06/2019 49 >39 mg/dL Final   Triglycerides  Date Value Ref Range Status  10/06/2019 136 0 - 149 mg/dL Final         Failed - CBC within normal limits and completed in the last 12 months    WBC  Date Value Ref Range Status  05/13/2022 8.1 4.0 - 10.5 K/uL Final   RBC  Date Value Ref Range Status  05/13/2022 3.48 (L) 3.87 - 5.11 MIL/uL Final   Hemoglobin  Date Value Ref Range Status  05/13/2022 10.3 (L) 12.0 - 15.0 g/dL Final  19/14/7829 56.2 11.1 - 15.9 g/dL Final   HCT  Date Value Ref Range Status  05/13/2022 31.9 (L) 36.0 - 46.0 % Final   Hematocrit  Date Value Ref Range Status  04/04/2021 34.0 34.0 - 46.6 % Final   MCHC  Date Value Ref Range Status  05/13/2022 32.3 30.0 - 36.0 g/dL Final   [facility]  Date Value Ref Range Status  05/13/2022 29.6 26.0 - 34.0 pg Final   MCV  Date Value Ref Range Status  05/13/2022 91.7 80.0 - 100.0 fL Final  04/04/2021 89 79 - 97 fL Final   No results found for: "PLTCOUNTKUC", "LABPLAT", "POCPLA" RDW  Date Value Ref Range Status  05/13/2022 13.2 11.5 - 15.5 % Final  04/04/2021 13.5 11.7  - 15.4 % Final         Failed - CMP within normal limits and completed in the last 12 months    Albumin  Date Value Ref Range Status  01/05/2022 4.6 3.9 - 4.9 g/dL Final   Alkaline Phosphatase  Date Value Ref Range Status  01/05/2022 57 44 - 121 IU/L Final   ALT  Date Value Ref Range Status  01/05/2022 14 0 - 32 IU/L Final   AST  Date Value Ref Range Status  01/05/2022 11 0 - 40 IU/L Final   BUN  Date Value Ref Range Status  05/13/2022 30 (H) 6 - 20 mg/dL Final  59/16/3846 14 6 - 20 mg/dL Final   Calcium  Date Value Ref Range Status  05/13/2022 9.3 8.9 - 10.3 mg/dL Final   CO2  Date Value Ref Range Status  05/13/2022 20 (L) 22 - 32 mmol/L Final   Creat  Date Value Ref Range Status  01/23/2016 0.83 0.50 - 1.10 mg/dL Final   Creatinine, Ser  Date Value Ref Range Status  05/13/2022 1.18 (H) 0.44 - 1.00 mg/dL Final   Creatinine, Urine  Date Value Ref Range Status  01/23/2016 231 20 - 320 mg/dL Final   Glucose, Bld  Date Value Ref Range Status  05/13/2022 207 (H) 70 - 99 mg/dL Final    Comment:    Glucose reference range applies only to samples taken after fasting for at least 8 hours.   POC Glucose  Date Value Ref Range Status  01/08/2020 99 70 - 99 mg/dl Final   Glucose-Capillary  Date Value Ref Range Status  05/24/2020 108 (H) 70 - 99 mg/dL Final    Comment:    Glucose reference range applies only to samples taken after fasting for at least 8 hours.   Potassium  Date Value Ref Range Status  05/13/2022 5.5 (H) 3.5 - 5.1 mmol/L Final   Sodium  Date Value Ref Range Status  05/13/2022 137 135 - 145 mmol/L Final  01/05/2022 134 134 - 144 mmol/L Final   Bilirubin Total  Date Value Ref Range Status  01/05/2022 <0.2 0.0 - 1.2 mg/dL Final   Protein, ur  Date Value Ref Range Status  05/13/2022 NEGATIVE NEGATIVE mg/dL Final   Total Protein  Date Value Ref Range Status  01/05/2022 6.9 6.0 - 8.5 g/dL Final   GFR, Est African American  Date Value  Ref Range Status  01/23/2016 >89 >=60 mL/min Final   GFR calc Af Amer  Date Value Ref Range Status  10/06/2019 125 >59 mL/min/1.73 Final    Comment:    **[facility] currently reports eGFR in compliance with the current**   recommendations of the [employer]. [facility] will   update reporting as new guidelines are published from the NKF-ASN   Task force.    eGFR  Date Value Ref Range Status  01/05/2022 79 >59 mL/min/1.73 Final   GFR, Est Non African American  Date Value Ref Range Status  01/23/2016 >89 >=60 mL/min Final   GFR, Estimated  Date Value Ref Range Status  05/13/2022 >60 >60 mL/min Final    Comment:    (NOTE) Calculated using the CKD-EPI Creatinine Equation (2021)          Passed - Completed PHQ-2 or PHQ-9  in the last 360 days      Passed - Last BP in normal range    BP Readings from Last 1 Encounters:  05/13/22 117/76         Passed - Last Heart Rate in normal range    Pulse Readings from Last 1 Encounters:  05/13/22 100         Passed - Valid encounter within last 6 months    Recent Outpatient Visits           1 month ago Primary hypertension   [place] [facility] [place], [place], [place]   4 months ago Type 2 diabetes mellitus with hyperglycemia, without long-term current use of insulin [facility](HCC)   [place] [facility] [place], [place]   7 months ago Type 2 diabetes mellitus with hyperglycemia, without long-term current use of insulin [facility](HCC)   [place] [facility] [place], [place], [place]   1 year ago Type 2 diabetes mellitus without complication, without long-term current use of insulin [facility](HCC)   [place] [facility] [place], [place] Stakes[place] W, NP   2 years ago Essential hypertension   [place] [facility] [place], [place], [place]       Future Appointments             In 1 month Claiborne RiggFleming, Zelda W, NP [facility]

## 2022-05-26 NOTE — Telephone Encounter (Signed)
Requested medication (s) are due for refill today: yes  Requested medication (s) are on the active medication list: yes  Last refill:  11/20/230#60/3  Future visit scheduled: yes  Notes to clinic:  Unable to refill per protocol, cannot delegate.    Requested Prescriptions  Pending Prescriptions Disp Refills   risperiDONE (RISPERDAL) 3 MG tablet 60 tablet 3    Sig: Take 1 tablet (3 mg total) by mouth 2 (two) times daily.     Not Delegated - Psychiatry:  Antipsychotics - Second Generation (Atypical) - risperidone Failed - 05/25/2022  5:10 PM      Failed - This refill cannot be delegated      Failed - TSH in normal range and within 360 days    TSH  Date Value Ref Range Status  05/24/2020 3.311 0.350 - 4.500 uIU/mL Final    Comment:    Performed by a 3rd Generation assay with a functional sensitivity of <=0.01 uIU/mL. Performed at [facility], [address], [place], [place] [address]   01/23/2016 3.08 mIU/L Final    Comment:      Reference Range   > or = 20 Years  0.40-4.50   Pregnancy Range First trimester  0.26-2.66 Second trimester 0.55-2.73 Third trimester  0.43-2.91            Failed - Lipid Panel in normal range within the last 12 months    Cholesterol, Total  Date Value Ref Range Status  10/06/2019 106 100 - 199 mg/dL Final   LDL Chol Calc (NIH)  Date Value Ref Range Status  10/06/2019 34 0 - 99 mg/dL Final   HDL  Date Value Ref Range Status  10/06/2019 49 >39 mg/dL Final   Triglycerides  Date Value Ref Range Status  10/06/2019 136 0 - 149 mg/dL Final         Failed - CBC within normal limits and completed in the last 12 months    WBC  Date Value Ref Range Status  05/13/2022 8.1 4.0 - 10.5 K/uL Final   RBC  Date Value Ref Range Status  05/13/2022 3.48 (L) 3.87 - 5.11 MIL/uL Final   Hemoglobin  Date Value Ref Range Status  05/13/2022 10.3 (L) 12.0 - 15.0 g/dL Final  96/29/5284 13.2 11.1 - 15.9 g/dL Final   HCT  Date Value Ref Range  Status  05/13/2022 31.9 (L) 36.0 - 46.0 % Final   Hematocrit  Date Value Ref Range Status  04/04/2021 34.0 34.0 - 46.6 % Final   MCHC  Date Value Ref Range Status  05/13/2022 32.3 30.0 - 36.0 g/dL Final   [facility]  Date Value Ref Range Status  05/13/2022 29.6 26.0 - 34.0 pg Final   MCV  Date Value Ref Range Status  05/13/2022 91.7 80.0 - 100.0 fL Final  04/04/2021 89 79 - 97 fL Final   No results found for: "PLTCOUNTKUC", "LABPLAT", "POCPLA" RDW  Date Value Ref Range Status  05/13/2022 13.2 11.5 - 15.5 % Final  04/04/2021 13.5 11.7 - 15.4 % Final         Failed - CMP within normal limits and completed in the last 12 months    Albumin  Date Value Ref Range Status  01/05/2022 4.6 3.9 - 4.9 g/dL Final   Alkaline Phosphatase  Date Value Ref Range Status  01/05/2022 57 44 - 121 IU/L Final   ALT  Date Value Ref Range Status  01/05/2022 14 0 - 32 IU/L Final   AST  Date Value Ref Range Status  01/05/2022 11 0 - 40 IU/L Final   BUN  Date Value Ref Range Status  05/13/2022 30 (H) 6 - 20 mg/dL Final  57/26/2035 14 6 - 20 mg/dL Final   Calcium  Date Value Ref Range Status  05/13/2022 9.3 8.9 - 10.3 mg/dL Final   CO2  Date Value Ref Range Status  05/13/2022 20 (L) 22 - 32 mmol/L Final   Creat  Date Value Ref Range Status  01/23/2016 0.83 0.50 - 1.10 mg/dL Final   Creatinine, Ser  Date Value Ref Range Status  05/13/2022 1.18 (H) 0.44 - 1.00 mg/dL Final   Creatinine, Urine  Date Value Ref Range Status  01/23/2016 231 20 - 320 mg/dL Final   Glucose, Bld  Date Value Ref Range Status  05/13/2022 207 (H) 70 - 99 mg/dL Final    Comment:    Glucose reference range applies only to samples taken after fasting for at least 8 hours.   POC Glucose  Date Value Ref Range Status  01/08/2020 99 70 - 99 mg/dl Final   Glucose-Capillary  Date Value Ref Range Status  05/24/2020 108 (H) 70 - 99 mg/dL Final    Comment:    Glucose reference range applies only to samples  taken after fasting for at least 8 hours.   Potassium  Date Value Ref Range Status  05/13/2022 5.5 (H) 3.5 - 5.1 mmol/L Final   Sodium  Date Value Ref Range Status  05/13/2022 137 135 - 145 mmol/L Final  01/05/2022 134 134 - 144 mmol/L Final   Bilirubin Total  Date Value Ref Range Status  01/05/2022 <0.2 0.0 - 1.2 mg/dL Final   Protein, ur  Date Value Ref Range Status  05/13/2022 NEGATIVE NEGATIVE mg/dL Final   Total Protein  Date Value Ref Range Status  01/05/2022 6.9 6.0 - 8.5 g/dL Final   GFR, Est African American  Date Value Ref Range Status  01/23/2016 >89 >=60 mL/min Final   GFR calc Af Amer  Date Value Ref Range Status  10/06/2019 125 >59 mL/min/1.73 Final    Comment:    **[facility] currently reports eGFR in compliance with the current**   recommendations of the [employer]. [facility] will   update reporting as new guidelines are published from the NKF-ASN   Task force.    eGFR  Date Value Ref Range Status  01/05/2022 79 >59 mL/min/1.73 Final   GFR, Est Non African American  Date Value Ref Range Status  01/23/2016 >89 >=60 mL/min Final   GFR, Estimated  Date Value Ref Range Status  05/13/2022 >60 >60 mL/min Final    Comment:    (NOTE) Calculated using the CKD-EPI Creatinine Equation (2021)          Passed - Completed PHQ-2 or PHQ-9 in the last 360 days      Passed - Last BP in normal range    BP Readings from Last 1 Encounters:  05/13/22 117/76         Passed - Last Heart Rate in normal range    Pulse Readings from Last 1 Encounters:  05/13/22 100         Passed - Valid encounter within last 6 months    Recent Outpatient Visits           1 month ago Primary hypertension   [place] [facility] [place], [place] W, NP   4 months ago Type 2 diabetes mellitus with hyperglycemia,  without long-term current use of insulin (HCC)   [place] [facility] [place], [place], [place]   7 months  ago Type 2 diabetes mellitus with hyperglycemia, without long-term current use of insulin [facility]HCC)   [place] [facility] [place], [place] Stakes[place] W, NP   1 year ago Type 2 diabetes mellitus without complication, without long-term current use of insulin (HCC)   [place] [facility] [place], [place], [place]   2 years ago Essential hypertension   [place] [facility] [place], [place], [place]       Future Appointments             In 1 month Claiborne RiggFleming, Zelda W, NP [facility]            Signed Prescriptions Disp Refills   metFORMIN (GLUCOPHAGE) 500 MG tablet 360 tablet 1    Sig: Take 2 tablets (1,000 mg total) by mouth 2 (two) times daily with a meal.     Endocrinology:  Diabetes - Biguanides Failed - 05/25/2022  5:10 PM      Failed - Cr in normal range and within 360 days    Creat  Date Value Ref Range Status  01/23/2016 0.83 0.50 - 1.10 mg/dL Final   Creatinine, Ser  Date Value Ref Range Status  05/13/2022 1.18 (H) 0.44 - 1.00 mg/dL Final   Creatinine, Urine  Date Value Ref Range Status  01/23/2016 231 20 - 320 mg/dL Final         Failed - B12 Level in normal range and within 720 days    No results found for: "VITAMINB12"       Failed - CBC within normal limits and completed in the last 12 months    WBC  Date Value Ref Range Status  05/13/2022 8.1 4.0 - 10.5 K/uL Final   RBC  Date Value Ref Range Status  05/13/2022 3.48 (L) 3.87 - 5.11 MIL/uL Final   Hemoglobin  Date Value Ref Range Status  05/13/2022 10.3 (L) 12.0 - 15.0 g/dL Final  96/04/540902/17/2023 81.111.6 11.1 - 15.9 g/dL Final   HCT  Date Value Ref Range Status  05/13/2022 31.9 (L) 36.0 - 46.0 % Final   Hematocrit  Date Value Ref Range Status  04/04/2021 34.0 34.0 - 46.6 % Final   MCHC  Date Value Ref Range Status  05/13/2022 32.3 30.0 - 36.0 g/dL Final   [facility]  Date Value Ref Range Status  05/13/2022 29.6 26.0 -  34.0 pg Final   MCV  Date Value Ref Range Status  05/13/2022 91.7 80.0 - 100.0 fL Final  04/04/2021 89 79 - 97 fL Final   No results found for: "PLTCOUNTKUC", "LABPLAT", "POCPLA" RDW  Date Value Ref Range Status  05/13/2022 13.2 11.5 - 15.5 % Final  04/04/2021 13.5 11.7 - 15.4 % Final         Passed - HBA1C is between 0 and 7.9 and within 180 days    HbA1c, POC (controlled diabetic range)  Date Value Ref Range Status  01/05/2022 6.6 0.0 - 7.0 % Final         Passed - eGFR in normal range and within 360 days    GFR, Est African American  Date Value Ref Range Status  01/23/2016 >89 >=60 mL/min Final   GFR calc Af Amer  Date Value Ref Range Status  10/06/2019 125 >59 mL/min/1.73 Final  Comment:    **[facility] currently reports eGFR in compliance with the current**   recommendations of the [employer]. [facility] will   update reporting as new guidelines are published from the NKF-ASN   Task force.    GFR, Est Non African American  Date Value Ref Range Status  01/23/2016 >89 >=60 mL/min Final   GFR, Estimated  Date Value Ref Range Status  05/13/2022 >60 >60 mL/min Final    Comment:    (NOTE) Calculated using the CKD-EPI Creatinine Equation (2021)    eGFR  Date Value Ref Range Status  01/05/2022 79 >59 mL/min/1.73 Final         Passed - Valid encounter within last 6 months    Recent Outpatient Visits           1 month ago Primary hypertension   [place] [facility] [place], [place] W, NP   4 months ago Type 2 diabetes mellitus with hyperglycemia, without long-term current use of insulin [facility])   [place] [facility] [place], [place] W, NP   7 months ago Type 2 diabetes mellitus with hyperglycemia, without long-term current use of insulin [facility])   [place] [facility] [place], [place] W, NP   1 year ago Type 2 diabetes mellitus without complication, without long-term current use  of insulin [facility])   [place] [facility] [place], Shea Stakes, NP   2 years ago Essential hypertension   [place] [facility] [place], [place], [place]       Future Appointments             In 1 month Claiborne Rigg, NP [facility]

## 2022-05-27 ENCOUNTER — Other Ambulatory Visit: Payer: Self-pay

## 2022-06-03 ENCOUNTER — Other Ambulatory Visit: Payer: Self-pay

## 2022-06-05 ENCOUNTER — Other Ambulatory Visit: Payer: Self-pay

## 2022-06-08 ENCOUNTER — Other Ambulatory Visit: Payer: Self-pay

## 2022-06-29 ENCOUNTER — Encounter: Payer: Self-pay | Admitting: Nurse Practitioner

## 2022-07-01 ENCOUNTER — Other Ambulatory Visit: Payer: Self-pay

## 2022-07-06 ENCOUNTER — Other Ambulatory Visit: Payer: Self-pay

## 2022-07-06 ENCOUNTER — Encounter: Payer: Self-pay | Admitting: Nurse Practitioner

## 2022-07-06 ENCOUNTER — Ambulatory Visit: Payer: Medicaid Other | Attending: Nurse Practitioner | Admitting: Nurse Practitioner

## 2022-07-06 VITALS — BP 120/81 | HR 94 | Ht 59.0 in | Wt 151.0 lb

## 2022-07-06 DIAGNOSIS — Z111 Encounter for screening for respiratory tuberculosis: Secondary | ICD-10-CM | POA: Diagnosis not present

## 2022-07-06 DIAGNOSIS — Z7984 Long term (current) use of oral hypoglycemic drugs: Secondary | ICD-10-CM

## 2022-07-06 DIAGNOSIS — E7800 Pure hypercholesterolemia, unspecified: Secondary | ICD-10-CM

## 2022-07-06 DIAGNOSIS — E1165 Type 2 diabetes mellitus with hyperglycemia: Secondary | ICD-10-CM

## 2022-07-06 DIAGNOSIS — Z0000 Encounter for general adult medical examination without abnormal findings: Secondary | ICD-10-CM

## 2022-07-06 DIAGNOSIS — H6121 Impacted cerumen, right ear: Secondary | ICD-10-CM | POA: Diagnosis not present

## 2022-07-06 LAB — POCT GLYCOSYLATED HEMOGLOBIN (HGB A1C): Hemoglobin A1C: 6.9 % — AB (ref 4.0–5.6)

## 2022-07-06 MED ORDER — DEBROX 6.5 % OT SOLN
5.0000 [drp] | Freq: Two times a day (BID) | OTIC | 0 refills | Status: AC
  Filled 2022-07-06: qty 15, 30d supply, fill #0

## 2022-07-06 NOTE — Progress Notes (Signed)
Assessment & Plan:  Makayla Roberts was seen today for diabetes and annual exam.  Diagnoses and all orders for this visit:  Encounter for annual physical exam  Type 2 diabetes mellitus with hyperglycemia, without long-term current use of insulin  Well controlled -     POCT glycosylated hemoglobin (Hb A1C) -     CMP14+EGFR  Screening-pulmonary TB -     QuantiFERON-TB Gold Plus  Impacted cerumen of right ear -     carbamide peroxide (DEBROX) 6.5 % OTIC solution; Place 5 drops into the right ear 2 (two) times daily.  Hypercholesterolemia -     Lipid panel    Patient has been counseled on age-appropriate routine health concerns for screening and prevention. These are reviewed and up-to-date. Referrals have been placed accordingly. Immunizations are up-to-date or declined.    Subjective:   Chief Complaint  Patient presents with   Diabetes   Annual Exam   Makayla Roberts 37 y.o. female presents to office today for follow up to DM She is accompanied by her sister in law today. She has a history of developmental delay, mental retardation, DM2, agitation, deafness, Blind in left eye    Blood pressure and A1c are well controlled.  BP Readings from Last 3 Encounters:  07/06/22 120/81  05/13/22 117/76  04/07/22 (!) 141/87      Review of Systems  Constitutional:  Negative for fever, malaise/fatigue and weight loss.  HENT: Negative.  Negative for nosebleeds.   Eyes:  Positive for blurred vision. Negative for double vision and photophobia.  Respiratory: Negative.  Negative for cough and shortness of breath.   Cardiovascular: Negative.  Negative for chest pain, palpitations and leg swelling.  Gastrointestinal: Negative.  Negative for heartburn, nausea and vomiting.  Genitourinary: Negative.   Musculoskeletal: Negative.  Negative for myalgias.  Skin: Negative.   Neurological: Negative.  Negative for dizziness, focal weakness, seizures and headaches.  Endo/Heme/Allergies: Negative.    Psychiatric/Behavioral: Negative.  Negative for suicidal ideas.     Past Medical History:  Diagnosis Date   Blind left eye    Deaf    Developmental delay    Diabetes mellitus without complication (HCC)    Mental retardation     Past Surgical History:  Procedure Laterality Date   None      Family History  Problem Relation Age of Onset   Hypertension Mother    Arthritis Mother    Heart disease Mother    Diabetes Father    Hypertension Father    Mental retardation Neg Hx     Social History Reviewed with no changes to be made today.   Outpatient Medications Prior to Visit  Medication Sig Dispense Refill   acetaminophen (TYLENOL) 325 MG tablet Take 2 tablets (650 mg total) by mouth every 6 (six) hours as needed for mild pain or headache (fever >/= 101).     Blood Glucose Monitoring Suppl (TRUE METRIX METER) w/Device KIT Use to check blood sugar TID. E11.9 1 kit 0   feeding supplement (ENSURE ENLIVE / ENSURE PLUS) LIQD Take 237 mLs by mouth 2 (two) times daily between meals. 237 mL 12   food thickener (THICK IT) POWD Use it to thicken liquids 284 g 0   glucose blood (ACCU-CHEK GUIDE) test strip Use 3 times daily to check blood sugar. 300 each 1   lisinopril (ZESTRIL) 5 MG tablet Take 1 tablet (5 mg total) by mouth daily. 90 tablet 1   Melatonin 5 MG CHEW Chew 5 mg  by mouth at bedtime as needed (sleep). 90 tablet 3   metFORMIN (GLUCOPHAGE) 500 MG tablet Take 2 tablets (1,000 mg total) by mouth 2 (two) times daily with a meal. 360 tablet 1   Misc. Devices MISC Please provide patient with medium size adult diapers and large disposable chux pads. ICD 10 R32.0 100 each PRN   risperiDONE (RISPERDAL) 3 MG tablet Take 1 tablet (3 mg total) by mouth 2 (two) times daily. 60 tablet 1   No facility-administered medications prior to visit.    Allergies  Allergen Reactions   Morphine And Codeine Shortness Of Breath       Objective:    BP 120/81 (BP Location: Left Arm, Patient  Position: Sitting, Cuff Size: Normal)   Pulse 94   Ht 4\' 11"  (1.499 m)   Wt 151 lb (68.5 kg)   SpO2 95%   BMI 30.50 kg/m  Wt Readings from Last 3 Encounters:  07/06/22 151 lb (68.5 kg)  05/12/22 150 lb (68 kg)  04/07/22 144 lb 4.8 oz (65.5 kg)    Physical Exam Constitutional:      Appearance: She is well-developed.  HENT:     Head: Normocephalic and atraumatic.     Right Ear: External ear normal. There is impacted cerumen.     Left Ear: Hearing, tympanic membrane, ear canal and external ear normal.     Nose: Nose normal.     Right Turbinates: Not enlarged.     Left Turbinates: Not enlarged.     Mouth/Throat:     Lips: Pink.     Mouth: Mucous membranes are moist.     Dentition: No dental tenderness, gingival swelling, dental abscesses or gum lesions.     Pharynx: No oropharyngeal exudate.  Eyes:     General: No scleral icterus.       Right eye: No discharge.     Pupils: Pupils are equal, round, and reactive to light.     Comments: left eye blindness  Neck:     Thyroid: No thyromegaly.     Trachea: No tracheal deviation.  Cardiovascular:     Rate and Rhythm: Normal rate and regular rhythm.     Heart sounds: Normal heart sounds. No murmur heard.    No friction rub.  Pulmonary:     Effort: Pulmonary effort is normal. No accessory muscle usage or respiratory distress.     Breath sounds: Normal breath sounds. No decreased breath sounds, wheezing, rhonchi or rales.  Abdominal:     General: Bowel sounds are normal. There is no distension.     Palpations: Abdomen is soft. There is no mass.     Tenderness: There is no abdominal tenderness. There is no right CVA tenderness, left CVA tenderness, guarding or rebound.     Hernia: No hernia is present.  Musculoskeletal:        General: No tenderness or deformity. Normal range of motion.     Cervical back: Normal range of motion and neck supple.  Lymphadenopathy:     Cervical: No cervical adenopathy.  Skin:    General: Skin is  warm and dry.     Findings: No erythema.  Neurological:     Mental Status: She is alert and oriented to person, place, and time.     Cranial Nerves: No cranial nerve deficit.     Motor: Motor function is intact.     Coordination: Coordination is intact. Coordination normal.     Gait: Gait is intact.  Deep Tendon Reflexes:     Reflex Scores:      Patellar reflexes are 1+ on the right side and 1+ on the left side. Psychiatric:        Attention and Perception: Attention normal.        Mood and Affect: Mood normal.        Speech: Speech normal.        Behavior: Behavior normal.        Thought Content: Thought content normal.        Judgment: Judgment normal.          Patient has been counseled extensively about nutrition and exercise as well as the importance of adherence with medications and regular follow-up. The patient was given clear instructions to go to ER or return to medical center if symptoms don't improve, worsen or new problems develop. The patient verbalized understanding.   Follow-up: Return in about 3 months (around 10/06/2022).   Claiborne Rigg, FNP-BC [facility] [place], [place] [phone]   07/06/2022, 4:17 PM

## 2022-07-08 ENCOUNTER — Other Ambulatory Visit: Payer: Self-pay | Admitting: Nurse Practitioner

## 2022-07-08 DIAGNOSIS — R7612 Nonspecific reaction to cell mediated immunity measurement of gamma interferon antigen response without active tuberculosis: Secondary | ICD-10-CM

## 2022-07-08 LAB — LIPID PANEL
Chol/HDL Ratio: 2.5 ratio (ref 0.0–4.4)
Cholesterol, Total: 106 mg/dL (ref 100–199)
HDL: 42 mg/dL (ref >39)
LDL Chol Calc (NIH): 35 mg/dL (ref 0–99)
Triglycerides: 178 mg/dL — ABNORMAL HIGH (ref 0–149)
VLDL Cholesterol Cal: 29 mg/dL (ref 5–40)

## 2022-07-08 LAB — QUANTIFERON-TB GOLD PLUS
QuantiFERON Mitogen Value: >10 IU/mL
QuantiFERON Nil Value: 0.07 IU/mL
QuantiFERON TB1 Ag Value: 3.39 IU/mL
QuantiFERON TB2 Ag Value: 3.46 IU/mL
QuantiFERON-TB Gold Plus: POSITIVE — AB

## 2022-07-08 LAB — CMP14+EGFR
ALT: 11 IU/L (ref 0–32)
AST: 12 IU/L (ref 0–40)
Albumin/Globulin Ratio: 1.7 (ref 1.2–2.2)
Albumin: 4.3 g/dL (ref 3.9–4.9)
Alkaline Phosphatase: 59 IU/L (ref 44–121)
BUN/Creatinine Ratio: 18 (ref 9–23)
BUN: 20 mg/dL (ref 6–20)
Bilirubin Total: <0.2 mg/dL (ref 0.0–1.2)
CO2: 21 mmol/L (ref 20–29)
Calcium: 9 mg/dL (ref 8.7–10.2)
Chloride: 103 mmol/L (ref 96–106)
Creatinine, Ser: 1.14 mg/dL — ABNORMAL HIGH (ref 0.57–1.00)
Globulin, Total: 2.5 g/dL (ref 1.5–4.5)
Glucose: 185 mg/dL — ABNORMAL HIGH (ref 70–99)
Potassium: 5.3 mmol/L — ABNORMAL HIGH (ref 3.5–5.2)
Sodium: 139 mmol/L (ref 134–144)
Total Protein: 6.8 g/dL (ref 6.0–8.5)
eGFR: 63 mL/min/{1.73_m2} (ref >59)

## 2022-07-14 ENCOUNTER — Ambulatory Visit
Admission: RE | Admit: 2022-07-14 | Discharge: 2022-07-14 | Disposition: A | Discharge status: Home / Self Care | Payer: Medicaid Other | Source: Ambulatory Visit | Attending: Nurse Practitioner | Admitting: Nurse Practitioner

## 2022-07-14 DIAGNOSIS — R7612 Nonspecific reaction to cell mediated immunity measurement of gamma interferon antigen response without active tuberculosis: Secondary | ICD-10-CM

## 2022-07-15 ENCOUNTER — Other Ambulatory Visit: Payer: Self-pay

## 2022-08-14 ENCOUNTER — Other Ambulatory Visit: Payer: Self-pay | Admitting: Nurse Practitioner

## 2022-08-14 ENCOUNTER — Other Ambulatory Visit: Payer: Self-pay

## 2022-08-14 DIAGNOSIS — F919 Conduct disorder, unspecified: Secondary | ICD-10-CM

## 2022-08-15 MED ORDER — RISPERIDONE 3 MG PO TABS
3.0000 mg | ORAL_TABLET | Freq: Two times a day (BID) | ORAL | 1 refills | Status: DC
  Filled 2022-08-15: qty 60, 30d supply, fill #0

## 2022-08-17 ENCOUNTER — Other Ambulatory Visit: Payer: Self-pay

## 2022-08-18 ENCOUNTER — Telehealth: Payer: Self-pay

## 2022-08-18 ENCOUNTER — Other Ambulatory Visit: Payer: Self-pay | Admitting: Nurse Practitioner

## 2022-08-18 ENCOUNTER — Other Ambulatory Visit: Payer: Self-pay

## 2022-08-18 DIAGNOSIS — F919 Conduct disorder, unspecified: Secondary | ICD-10-CM

## 2022-08-18 MED ORDER — RISPERIDONE 3 MG PO TABS
3.0000 mg | ORAL_TABLET | Freq: Two times a day (BID) | ORAL | 0 refills | Status: DC
  Filled 2022-08-18: qty 180, 90d supply, fill #0

## 2022-08-18 NOTE — Telephone Encounter (Signed)
Medication sent. She will need to let the pharmacy know she wants the entire 90 day supply up front. I can not guarantee insurance will pay for this

## 2022-08-18 NOTE — Telephone Encounter (Signed)
Patient came in with Makayla Roberts asking if she could get the medication risperiDONE filled for 3 months if possible as [place]

## 2022-08-19 ENCOUNTER — Other Ambulatory Visit: Payer: Self-pay

## 2022-08-19 NOTE — Telephone Encounter (Signed)
Unable to reach patient by phone to relay results.   Voicemail left with response from provider.

## 2022-08-28 ENCOUNTER — Other Ambulatory Visit (HOSPITAL_COMMUNITY): Payer: Self-pay

## 2022-09-11 ENCOUNTER — Other Ambulatory Visit: Payer: Self-pay

## 2022-11-24 ENCOUNTER — Other Ambulatory Visit: Payer: Self-pay | Admitting: Nurse Practitioner

## 2022-11-24 ENCOUNTER — Other Ambulatory Visit: Payer: Self-pay

## 2022-11-24 DIAGNOSIS — I10 Essential (primary) hypertension: Secondary | ICD-10-CM

## 2022-11-24 DIAGNOSIS — E1165 Type 2 diabetes mellitus with hyperglycemia: Secondary | ICD-10-CM

## 2022-11-24 DIAGNOSIS — F919 Conduct disorder, unspecified: Secondary | ICD-10-CM

## 2022-11-24 MED ORDER — METFORMIN HCL 500 MG PO TABS
1000.0000 mg | ORAL_TABLET | Freq: Two times a day (BID) | ORAL | 0 refills | Status: AC
  Filled 2022-11-24: qty 360, 90d supply, fill #0

## 2022-11-24 MED ORDER — LISINOPRIL 5 MG PO TABS
5.0000 mg | ORAL_TABLET | Freq: Every day | ORAL | 0 refills | Status: AC
  Filled 2022-11-24: qty 90, 90d supply, fill #0

## 2022-11-24 MED ORDER — RISPERIDONE 3 MG PO TABS
3.0000 mg | ORAL_TABLET | Freq: Two times a day (BID) | ORAL | 0 refills | Status: AC
  Filled 2022-11-24: qty 60, 30d supply, fill #0

## 2022-11-24 NOTE — Telephone Encounter (Signed)
Requested Prescriptions  Pending Prescriptions Disp Refills   lisinopril (ZESTRIL) 5 MG tablet 90 tablet 0    Sig: Take 1 tablet (5 mg total) by mouth daily.     Cardiovascular:  ACE Inhibitors Failed - 11/24/2022 11:30 AM      Failed - Cr in normal range and within 180 days    Creat  Date Value Ref Range Status  01/23/2016 0.83 0.50 - 1.10 mg/dL Final   Creatinine, Ser  Date Value Ref Range Status  07/06/2022 1.14 (H) 0.57 - 1.00 mg/dL Final   Creatinine, Urine  Date Value Ref Range Status  01/23/2016 231 20 - 320 mg/dL Final         Failed - K in normal range and within 180 days    Potassium  Date Value Ref Range Status  07/06/2022 5.3 (H) 3.5 - 5.2 mmol/L Final         Passed - Patient is not pregnant      Passed - Last BP in normal range    BP Readings from Last 1 Encounters:  07/06/22 120/81         Passed - Valid encounter within last 6 months    Recent Outpatient Visits           4 months ago Encounter for annual physical exam   [facility], Shea Stakes, NP   7 months ago Primary hypertension   [place] [place], Shea Stakes, NP   10 months ago Type 2 diabetes mellitus with hyperglycemia, without long-term current use of insulin (HCC)   [place] [facility] & [facility] [place], [place] W, NP   1 year ago Type 2 diabetes mellitus with hyperglycemia, without long-term current use of insulin (HCC)   [place] [facility] [place], [place] W, NP   1 year ago Type 2 diabetes mellitus without complication, without long-term current use of insulin (HCC)   [place] [facility] [place], [place] W, NP               metFORMIN (GLUCOPHAGE) 500 MG tablet 360 tablet 0    Sig: Take 2 tablets (1,000 mg total) by mouth 2 (two) times daily with a meal.     Endocrinology:  Diabetes - Biguanides Failed - 11/24/2022 11:30 AM      Failed - Cr in  normal range and within 360 days    Creat  Date Value Ref Range Status  01/23/2016 0.83 0.50 - 1.10 mg/dL Final   Creatinine, Ser  Date Value Ref Range Status  07/06/2022 1.14 (H) 0.57 - 1.00 mg/dL Final   Creatinine, Urine  Date Value Ref Range Status  01/23/2016 231 20 - 320 mg/dL Final         Failed - B12 Level in normal range and within 720 days    No results found for: "VITAMINB12"       Passed - HBA1C is between 0 and 7.9 and within 180 days    Hemoglobin A1C  Date Value Ref Range Status  07/06/2022 6.9 (A) 4.0 - 5.6 % Corrected   HbA1c, POC (controlled diabetic range)  Date Value Ref Range Status  01/05/2022 6.6 0.0 - 7.0 % Final         Passed - eGFR in normal range and within 360 days    GFR, Est African American  Date Value Ref Range Status  01/23/2016 >89 >=  60 mL/min Final   GFR calc Af Amer  Date Value Ref Range Status  10/06/2019 125 >59 mL/min/1.73 Final    Comment:    **[facility] currently reports eGFR in compliance with the current**   recommendations of the [employer]. [facility] will   update reporting as new guidelines are published from the NKF-ASN   Task force.    GFR, Est Non African American  Date Value Ref Range Status  01/23/2016 >89 >=60 mL/min Final   GFR, Estimated  Date Value Ref Range Status  05/13/2022 >60 >60 mL/min Final    Comment:    (NOTE) Calculated using the CKD-EPI Creatinine Equation (2021)    eGFR  Date Value Ref Range Status  07/06/2022 63 >59 mL/min/1.73 Final         Passed - Valid encounter within last 6 months    Recent Outpatient Visits           4 months ago Encounter for annual physical exam   [place] [facility] [place], [place] W, NP   7 months ago Primary hypertension   [place] [facility] [place], Shea Stakes, NP   10 months ago Type 2 diabetes mellitus with hyperglycemia, without long-term current use of insulin (HCC)   [place]  [facility] [place], [place] W, NP   1 year ago Type 2 diabetes mellitus with hyperglycemia, without long-term current use of insulin (HCC)   [place] [facility] [place], [place] W, NP   1 year ago Type 2 diabetes mellitus without complication, without long-term current use of insulin (HCC)   [place] [facility] [place], [place] W, NP              Passed - CBC within normal limits and completed in the last 12 months    WBC  Date Value Ref Range Status  05/13/2022 8.1 4.0 - 10.5 K/uL Final   RBC  Date Value Ref Range Status  05/13/2022 3.48 (L) 3.87 - 5.11 MIL/uL Final   Hemoglobin  Date Value Ref Range Status  05/13/2022 10.3 (L) 12.0 - 15.0 g/dL Final  16/11/9602 54.0 11.1 - 15.9 g/dL Final   HCT  Date Value Ref Range Status  05/13/2022 31.9 (L) 36.0 - 46.0 % Final   Hematocrit  Date Value Ref Range Status  04/04/2021 34.0 34.0 - 46.6 % Final   MCHC  Date Value Ref Range Status  05/13/2022 32.3 30.0 - 36.0 g/dL Final   [facility]  Date Value Ref Range Status  05/13/2022 29.6 26.0 - 34.0 pg Final   MCV  Date Value Ref Range Status  05/13/2022 91.7 80.0 - 100.0 fL Final  04/04/2021 89 79 - 97 fL Final   No results found for: "PLTCOUNTKUC", "LABPLAT", "POCPLA" RDW  Date Value Ref Range Status  05/13/2022 13.2 11.5 - 15.5 % Final  04/04/2021 13.5 11.7 - 15.4 % Final          risperiDONE (RISPERDAL) 3 MG tablet 180 tablet     Sig: Take 1 tablet (3 mg total) by mouth 2 (two) times daily.     Not Delegated - Psychiatry:  Antipsychotics - Second Generation (Atypical) - risperidone Failed - 11/24/2022 11:30 AM      Failed - This refill cannot be delegated      Failed - TSH in normal range and within 360 days    TSH  Date Value Ref  Range Status  05/24/2020 3.311 0.350 - 4.500 uIU/mL Final    Comment:    Performed by a 3rd Generation assay with a functional sensitivity of <=0.01 uIU/mL. Performed  at [facility], [address], [place], [place] [address]   01/23/2016 3.08 mIU/L Final    Comment:      Reference Range   > or = 20 Years  0.40-4.50   Pregnancy Range First trimester  0.26-2.66 Second trimester 0.55-2.73 Third trimester  0.43-2.91            Failed - Lipid Panel in normal range within the last 12 months    Cholesterol, Total  Date Value Ref Range Status  07/06/2022 106 100 - 199 mg/dL Final   LDL Chol Calc (NIH)  Date Value Ref Range Status  07/06/2022 35 0 - 99 mg/dL Final   HDL  Date Value Ref Range Status  07/06/2022 42 >39 mg/dL Final   Triglycerides  Date Value Ref Range Status  07/06/2022 178 (H) 0 - 149 mg/dL Final         Failed - CMP within normal limits and completed in the last 12 months    Albumin  Date Value Ref Range Status  07/06/2022 4.3 3.9 - 4.9 g/dL Final   Alkaline Phosphatase  Date Value Ref Range Status  07/06/2022 59 44 - 121 IU/L Final   ALT  Date Value Ref Range Status  07/06/2022 11 0 - 32 IU/L Final   AST  Date Value Ref Range Status  07/06/2022 12 0 - 40 IU/L Final   BUN  Date Value Ref Range Status  07/06/2022 20 6 - 20 mg/dL Final   Calcium  Date Value Ref Range Status  07/06/2022 9.0 8.7 - 10.2 mg/dL Final   CO2  Date Value Ref Range Status  07/06/2022 21 20 - 29 mmol/L Final   Creat  Date Value Ref Range Status  01/23/2016 0.83 0.50 - 1.10 mg/dL Final   Creatinine, Ser  Date Value Ref Range Status  07/06/2022 1.14 (H) 0.57 - 1.00 mg/dL Final   Creatinine, Urine  Date Value Ref Range Status  01/23/2016 231 20 - 320 mg/dL Final   Glucose  Date Value Ref Range Status  07/06/2022 185 (H) 70 - 99 mg/dL Final   Glucose, Bld  Date Value Ref Range Status  05/13/2022 207 (H) 70 - 99 mg/dL Final    Comment:    Glucose reference range applies only to samples taken after fasting for at least 8 hours.   POC Glucose  Date Value Ref Range Status  01/08/2020 99 70 - 99 mg/dl Final    Glucose-Capillary  Date Value Ref Range Status  05/24/2020 108 (H) 70 - 99 mg/dL Final    Comment:    Glucose reference range applies only to samples taken after fasting for at least 8 hours.   Potassium  Date Value Ref Range Status  07/06/2022 5.3 (H) 3.5 - 5.2 mmol/L Final   Sodium  Date Value Ref Range Status  07/06/2022 139 134 - 144 mmol/L Final   Bilirubin Total  Date Value Ref Range Status  07/06/2022 <0.2 0.0 - 1.2 mg/dL Final   Protein, ur  Date Value Ref Range Status  05/13/2022 NEGATIVE NEGATIVE mg/dL Final   Total Protein  Date Value Ref Range Status  07/06/2022 6.8 6.0 - 8.5 g/dL Final   GFR, Est African American  Date Value Ref Range Status  01/23/2016 >89 >=60 mL/min Final  GFR calc Af Amer  Date Value Ref Range Status  10/06/2019 125 >59 mL/min/1.73 Final    Comment:    **[facility] currently reports eGFR in compliance with the current**   recommendations of the [employer]. [facility] will   update reporting as new guidelines are published from the NKF-ASN   Task force.    eGFR  Date Value Ref Range Status  07/06/2022 63 >59 mL/min/1.73 Final   GFR, Est Non African American  Date Value Ref Range Status  01/23/2016 >89 >=60 mL/min Final   GFR, Estimated  Date Value Ref Range Status  05/13/2022 >60 >60 mL/min Final    Comment:    (NOTE) Calculated using the CKD-EPI Creatinine Equation (2021)          Passed - Completed PHQ-2 or PHQ-9 in the last 360 days      Passed - Last BP in normal range    BP Readings from Last 1 Encounters:  07/06/22 120/81         Passed - Last Heart Rate in normal range    Pulse Readings from Last 1 Encounters:  07/06/22 94         Passed - Valid encounter within last 6 months    Recent Outpatient Visits           4 months ago Encounter for annual physical exam   [place] [facility] [place], [place] W, NP   7 months ago Primary hypertension   [place]  [facility] [place], Shea Stakes, NP   10 months ago Type 2 diabetes mellitus with hyperglycemia, without long-term current use of insulin (HCC)   [place] [facility] [place], [place] W, NP   1 year ago Type 2 diabetes mellitus with hyperglycemia, without long-term current use of insulin (HCC)   [place] [facility] [place], [place] W, NP   1 year ago Type 2 diabetes mellitus without complication, without long-term current use of insulin (HCC)   [facility] [place], [place] W, NP              Passed - CBC within normal limits and completed in the last 12 months    WBC  Date Value Ref Range Status  05/13/2022 8.1 4.0 - 10.5 K/uL Final   RBC  Date Value Ref Range Status  05/13/2022 3.48 (L) 3.87 - 5.11 MIL/uL Final   Hemoglobin  Date Value Ref Range Status  05/13/2022 10.3 (L) 12.0 - 15.0 g/dL Final  86/57/8469 62.9 11.1 - 15.9 g/dL Final   HCT  Date Value Ref Range Status  05/13/2022 31.9 (L) 36.0 - 46.0 % Final   Hematocrit  Date Value Ref Range Status  04/04/2021 34.0 34.0 - 46.6 % Final   MCHC  Date Value Ref Range Status  05/13/2022 32.3 30.0 - 36.0 g/dL Final   [facility]  Date Value Ref Range Status  05/13/2022 29.6 26.0 - 34.0 pg Final   MCV  Date Value Ref Range Status  05/13/2022 91.7 80.0 - 100.0 fL Final  04/04/2021 89 79 - 97 fL Final   No results found for: "PLTCOUNTKUC", "LABPLAT", "POCPLA" RDW  Date Value Ref Range Status  05/13/2022 13.2 11.5 - 15.5 % Final  04/04/2021 13.5 11.7 - 15.4 % Final

## 2022-11-24 NOTE — Telephone Encounter (Signed)
Requested medication (s) are due for refill today: yes  Requested medication (s) are on the active medication list: yes  Last refill:  08/18/22 #180  Future visit scheduled:no  Notes to clinic:  medication not delegated to NT to RF   Requested Prescriptions  Pending Prescriptions Disp Refills   risperiDONE (RISPERDAL) 3 MG tablet 180 tablet     Sig: Take 1 tablet (3 mg total) by mouth 2 (two) times daily.     Not Delegated - Psychiatry:  Antipsychotics - Second Generation (Atypical) - risperidone Failed - 11/24/2022 11:30 AM      Failed - This refill cannot be delegated      Failed - TSH in normal range and within 360 days    TSH  Date Value Ref Range Status  05/24/2020 3.311 0.350 - 4.500 uIU/mL Final    Comment:    Performed by a 3rd Generation assay with a functional sensitivity of <=0.01 uIU/mL. Performed at [facility], [address], [place], [place] [address]   01/23/2016 3.08 mIU/L Final    Comment:      Reference Range   > or = 20 Years  0.40-4.50   Pregnancy Range First trimester  0.26-2.66 Second trimester 0.55-2.73 Third trimester  0.43-2.91            Failed - Lipid Panel in normal range within the last 12 months    Cholesterol, Total  Date Value Ref Range Status  07/06/2022 106 100 - 199 mg/dL Final   LDL Chol Calc (NIH)  Date Value Ref Range Status  07/06/2022 35 0 - 99 mg/dL Final   HDL  Date Value Ref Range Status  07/06/2022 42 >39 mg/dL Final   Triglycerides  Date Value Ref Range Status  07/06/2022 178 (H) 0 - 149 mg/dL Final         Failed - CMP within normal limits and completed in the last 12 months    Albumin  Date Value Ref Range Status  07/06/2022 4.3 3.9 - 4.9 g/dL Final   Alkaline Phosphatase  Date Value Ref Range Status  07/06/2022 59 44 - 121 IU/L Final   ALT  Date Value Ref Range Status  07/06/2022 11 0 - 32 IU/L Final   AST  Date Value Ref Range Status  07/06/2022 12 0 - 40 IU/L Final   BUN  Date Value  Ref Range Status  07/06/2022 20 6 - 20 mg/dL Final   Calcium  Date Value Ref Range Status  07/06/2022 9.0 8.7 - 10.2 mg/dL Final   CO2  Date Value Ref Range Status  07/06/2022 21 20 - 29 mmol/L Final   Creat  Date Value Ref Range Status  01/23/2016 0.83 0.50 - 1.10 mg/dL Final   Creatinine, Ser  Date Value Ref Range Status  07/06/2022 1.14 (H) 0.57 - 1.00 mg/dL Final   Creatinine, Urine  Date Value Ref Range Status  01/23/2016 231 20 - 320 mg/dL Final   Glucose  Date Value Ref Range Status  07/06/2022 185 (H) 70 - 99 mg/dL Final   Glucose, Bld  Date Value Ref Range Status  05/13/2022 207 (H) 70 - 99 mg/dL Final    Comment:    Glucose reference range applies only to samples taken after fasting for at least 8 hours.   POC Glucose  Date Value Ref Range Status  01/08/2020 99 70 - 99 mg/dl Final   Glucose-Capillary  Date Value Ref Range Status  05/24/2020 108 (H) 70 -  99 mg/dL Final    Comment:    Glucose reference range applies only to samples taken after fasting for at least 8 hours.   Potassium  Date Value Ref Range Status  07/06/2022 5.3 (H) 3.5 - 5.2 mmol/L Final   Sodium  Date Value Ref Range Status  07/06/2022 139 134 - 144 mmol/L Final   Bilirubin Total  Date Value Ref Range Status  07/06/2022 <0.2 0.0 - 1.2 mg/dL Final   Protein, ur  Date Value Ref Range Status  05/13/2022 NEGATIVE NEGATIVE mg/dL Final   Total Protein  Date Value Ref Range Status  07/06/2022 6.8 6.0 - 8.5 g/dL Final   GFR, Est African American  Date Value Ref Range Status  01/23/2016 >89 >=60 mL/min Final   GFR calc Af Amer  Date Value Ref Range Status  10/06/2019 125 >59 mL/min/1.73 Final    Comment:    **[facility] currently reports eGFR in compliance with the current**   recommendations of the [employer]. [facility] will   update reporting as new guidelines are published from the NKF-ASN   Task force.    eGFR  Date Value Ref Range Status  07/06/2022 63  >59 mL/min/1.73 Final   GFR, Est Non African American  Date Value Ref Range Status  01/23/2016 >89 >=60 mL/min Final   GFR, Estimated  Date Value Ref Range Status  05/13/2022 >60 >60 mL/min Final    Comment:    (NOTE) Calculated using the CKD-EPI Creatinine Equation (2021)          Passed - Completed PHQ-2 or PHQ-9 in the last 360 days      Passed - Last BP in normal range    BP Readings from Last 1 Encounters:  07/06/22 120/81         Passed - Last Heart Rate in normal range    Pulse Readings from Last 1 Encounters:  07/06/22 94         Passed - Valid encounter within last 6 months    Recent Outpatient Visits           4 months ago Encounter for annual physical exam   [place] [facility] [place], Shea Stakes, NP   7 months ago Primary hypertension   [place] [facility] [place], [place] W, NP   10 months ago Type 2 diabetes mellitus with hyperglycemia, without long-term current use of insulin [facility])   [place] [facility] [place], [place] W, NP   1 year ago Type 2 diabetes mellitus with hyperglycemia, without long-term current use of insulin [facility])   [place] [facility] [place], [place] W, NP   1 year ago Type 2 diabetes mellitus without complication, without long-term current use of insulin [facility])    [place] [facility] [place], [place] W, NP              Passed - CBC within normal limits and completed in the last 12 months    WBC  Date Value Ref Range Status  05/13/2022 8.1 4.0 - 10.5 K/uL Final   RBC  Date Value Ref Range Status  05/13/2022 3.48 (L) 3.87 - 5.11 MIL/uL Final   Hemoglobin  Date Value Ref Range Status  05/13/2022 10.3 (L) 12.0 - 15.0 g/dL Final  95/62/1308 65.7 11.1 - 15.9 g/dL Final   HCT  Date Value Ref Range Status  05/13/2022 31.9 (L) 36.0 - 46.0 %  Final   Hematocrit  Date Value Ref Range Status  04/04/2021 34.0  34.0 - 46.6 % Final   MCHC  Date Value Ref Range Status  05/13/2022 32.3 30.0 - 36.0 g/dL Final   [facility]  Date Value Ref Range Status  05/13/2022 29.6 26.0 - 34.0 pg Final   MCV  Date Value Ref Range Status  05/13/2022 91.7 80.0 - 100.0 fL Final  04/04/2021 89 79 - 97 fL Final   No results found for: "PLTCOUNTKUC", "LABPLAT", "POCPLA" RDW  Date Value Ref Range Status  05/13/2022 13.2 11.5 - 15.5 % Final  04/04/2021 13.5 11.7 - 15.4 % Final         Signed Prescriptions Disp Refills   lisinopril (ZESTRIL) 5 MG tablet 90 tablet 0    Sig: Take 1 tablet (5 mg total) by mouth daily.     Cardiovascular:  ACE Inhibitors Failed - 11/24/2022 11:30 AM      Failed - Cr in normal range and within 180 days    Creat  Date Value Ref Range Status  01/23/2016 0.83 0.50 - 1.10 mg/dL Final   Creatinine, Ser  Date Value Ref Range Status  07/06/2022 1.14 (H) 0.57 - 1.00 mg/dL Final   Creatinine, Urine  Date Value Ref Range Status  01/23/2016 231 20 - 320 mg/dL Final         Failed - K in normal range and within 180 days    Potassium  Date Value Ref Range Status  07/06/2022 5.3 (H) 3.5 - 5.2 mmol/L Final         Passed - Patient is not pregnant      Passed - Last BP in normal range    BP Readings from Last 1 Encounters:  07/06/22 120/81         Passed - Valid encounter within last 6 months    Recent Outpatient Visits           4 months ago Encounter for annual physical exam   [place] [facility] [place], Shea Stakes, NP   7 months ago Primary hypertension   [place] [facility] [place], [place] W, NP   10 months ago Type 2 diabetes mellitus with hyperglycemia, without long-term current use of insulin [facility])   [place] [facility] [place], [place] W, NP   1 year ago Type 2 diabetes mellitus with hyperglycemia, without long-term current use of insulin (HCC)   [place] [facility]  [place], [place] W, NP   1 year ago Type 2 diabetes mellitus without complication, without long-term current use of insulin (HCC)    [facility] [place], [place] W, NP               metFORMIN (GLUCOPHAGE) 500 MG tablet 360 tablet 0    Sig: Take 2 tablets (1,000 mg total) by mouth 2 (two) times daily with a meal.     Endocrinology:  Diabetes - Biguanides Failed - 11/24/2022 11:30 AM      Failed - Cr in normal range and within 360 days    Creat  Date Value Ref Range Status  01/23/2016 0.83 0.50 - 1.10 mg/dL Final   Creatinine, Ser  Date Value Ref Range Status  07/06/2022 1.14 (H) 0.57 - 1.00 mg/dL Final   Creatinine, Urine  Date Value Ref Range Status  01/23/2016 231 20 - 320 mg/dL Final  Failed - B12 Level in normal range and within 720 days    No results found for: "VITAMINB12"       Passed - HBA1C is between 0 and 7.9 and within 180 days    Hemoglobin A1C  Date Value Ref Range Status  07/06/2022 6.9 (A) 4.0 - 5.6 % Corrected   HbA1c, POC (controlled diabetic range)  Date Value Ref Range Status  01/05/2022 6.6 0.0 - 7.0 % Final         Passed - eGFR in normal range and within 360 days    GFR, Est African American  Date Value Ref Range Status  01/23/2016 >89 >=60 mL/min Final   GFR calc Af Amer  Date Value Ref Range Status  10/06/2019 125 >59 mL/min/1.73 Final    Comment:    **[facility] currently reports eGFR in compliance with the current**   recommendations of the [employer]. [facility] will   update reporting as new guidelines are published from the NKF-ASN   Task force.    GFR, Est Non African American  Date Value Ref Range Status  01/23/2016 >89 >=60 mL/min Final   GFR, Estimated  Date Value Ref Range Status  05/13/2022 >60 >60 mL/min Final    Comment:    (NOTE) Calculated using the CKD-EPI Creatinine Equation (2021)    eGFR  Date Value Ref Range Status  07/06/2022 63 >59 mL/min/1.73 Final          Passed - Valid encounter within last 6 months    Recent Outpatient Visits           4 months ago Encounter for annual physical exam   [place] [facility] [place], Shea Stakes, NP   7 months ago Primary hypertension   [place] [facility] [place], [place] W, NP   10 months ago Type 2 diabetes mellitus with hyperglycemia, without long-term current use of insulin [facility])   [place] [facility] [place], [place] W, NP   1 year ago Type 2 diabetes mellitus with hyperglycemia, without long-term current use of insulin [facility])   [place] [place], [place] W, NP   1 year ago Type 2 diabetes mellitus without complication, without long-term current use of insulin (HCC)   [place] [facility] [place], [place] W, NP              Passed - CBC within normal limits and completed in the last 12 months    WBC  Date Value Ref Range Status  05/13/2022 8.1 4.0 - 10.5 K/uL Final   RBC  Date Value Ref Range Status  05/13/2022 3.48 (L) 3.87 - 5.11 MIL/uL Final   Hemoglobin  Date Value Ref Range Status  05/13/2022 10.3 (L) 12.0 - 15.0 g/dL Final  56/21/3086 57.8 11.1 - 15.9 g/dL Final   HCT  Date Value Ref Range Status  05/13/2022 31.9 (L) 36.0 - 46.0 % Final   Hematocrit  Date Value Ref Range Status  04/04/2021 34.0 34.0 - 46.6 % Final   MCHC  Date Value Ref Range Status  05/13/2022 32.3 30.0 - 36.0 g/dL Final   [facility]  Date Value Ref Range Status  05/13/2022 29.6 26.0 - 34.0 pg Final   MCV  Date Value Ref Range Status  05/13/2022 91.7 80.0 - 100.0 fL Final  04/04/2021 89 79 - 97 fL Final   No results found for: "PLTCOUNTKUC", "LABPLAT", "POCPLA"  RDW  Date Value Ref Range Status  05/13/2022 13.2 11.5 - 15.5 % Final  04/04/2021 13.5 11.7 - 15.4 % Final

## 2022-11-25 ENCOUNTER — Other Ambulatory Visit: Payer: Self-pay

## 2023-01-17 DEATH — deceased
